# Patient Record
Sex: Female | Born: 1955 | Race: White | Hispanic: No | Marital: Married | Smoking: Never smoker
Health system: Southern US, Community
[De-identification: ages and names within clinical notes are randomized; demographics above are authoritative.]

## PROBLEM LIST (undated history)

## (undated) DIAGNOSIS — Z860100 Personal history of colon polyps, unspecified: Secondary | ICD-10-CM

## (undated) DIAGNOSIS — B019 Varicella without complication: Secondary | ICD-10-CM

## (undated) DIAGNOSIS — Z8709 Personal history of other diseases of the respiratory system: Secondary | ICD-10-CM

## (undated) DIAGNOSIS — F419 Anxiety disorder, unspecified: Secondary | ICD-10-CM

## (undated) DIAGNOSIS — Z923 Personal history of irradiation: Secondary | ICD-10-CM

## (undated) DIAGNOSIS — T4145XA Adverse effect of unspecified anesthetic, initial encounter: Secondary | ICD-10-CM

## (undated) DIAGNOSIS — C801 Malignant (primary) neoplasm, unspecified: Secondary | ICD-10-CM

## (undated) DIAGNOSIS — T8859XA Other complications of anesthesia, initial encounter: Secondary | ICD-10-CM

## (undated) DIAGNOSIS — F909 Attention-deficit hyperactivity disorder, unspecified type: Secondary | ICD-10-CM

## (undated) DIAGNOSIS — K219 Gastro-esophageal reflux disease without esophagitis: Secondary | ICD-10-CM

## (undated) DIAGNOSIS — M1990 Unspecified osteoarthritis, unspecified site: Secondary | ICD-10-CM

## (undated) DIAGNOSIS — K5730 Diverticulosis of large intestine without perforation or abscess without bleeding: Secondary | ICD-10-CM

## (undated) DIAGNOSIS — K648 Other hemorrhoids: Secondary | ICD-10-CM

## (undated) DIAGNOSIS — Z8719 Personal history of other diseases of the digestive system: Secondary | ICD-10-CM

## (undated) HISTORY — DX: Varicella without complication: B01.9

## (undated) HISTORY — PX: BUNIONECTOMY: SHX129

## (undated) HISTORY — DX: Unspecified osteoarthritis, unspecified site: M19.90

## (undated) HISTORY — DX: Anxiety disorder, unspecified: F41.9

## (undated) HISTORY — PX: ABDOMINAL HYSTERECTOMY: SHX81

## (undated) HISTORY — DX: Attention-deficit hyperactivity disorder, unspecified type: F90.9

---
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
## 1998-03-21 ENCOUNTER — Ambulatory Visit (HOSPITAL_COMMUNITY): Admission: RE | Admit: 1998-03-21 | Discharge: 1998-03-21 | Payer: Self-pay | Admitting: Obstetrics & Gynecology

## 1998-09-25 ENCOUNTER — Other Ambulatory Visit: Admission: RE | Admit: 1998-09-25 | Discharge: 1998-09-25 | Payer: Self-pay | Admitting: Obstetrics & Gynecology

## 1999-04-09 ENCOUNTER — Encounter: Payer: Self-pay | Admitting: Obstetrics & Gynecology

## 1999-04-09 ENCOUNTER — Ambulatory Visit (HOSPITAL_COMMUNITY): Admission: RE | Admit: 1999-04-09 | Discharge: 1999-04-09 | Payer: Self-pay | Admitting: Obstetrics & Gynecology

## 1999-11-12 ENCOUNTER — Other Ambulatory Visit: Admission: RE | Admit: 1999-11-12 | Discharge: 1999-11-12 | Payer: Self-pay | Admitting: Obstetrics & Gynecology

## 2001-01-08 ENCOUNTER — Other Ambulatory Visit: Admission: RE | Admit: 2001-01-08 | Discharge: 2001-01-08 | Payer: Self-pay | Admitting: Obstetrics & Gynecology

## 2002-03-26 ENCOUNTER — Other Ambulatory Visit: Admission: RE | Admit: 2002-03-26 | Discharge: 2002-03-26 | Payer: Self-pay | Admitting: Obstetrics & Gynecology

## 2003-03-25 ENCOUNTER — Other Ambulatory Visit: Admission: RE | Admit: 2003-03-25 | Discharge: 2003-03-25 | Payer: Self-pay | Admitting: Obstetrics & Gynecology

## 2004-04-20 ENCOUNTER — Other Ambulatory Visit: Admission: RE | Admit: 2004-04-20 | Discharge: 2004-04-20 | Payer: Self-pay | Admitting: Obstetrics & Gynecology

## 2005-05-27 ENCOUNTER — Other Ambulatory Visit: Admission: RE | Admit: 2005-05-27 | Discharge: 2005-05-27 | Payer: Self-pay | Admitting: Obstetrics & Gynecology

## 2005-06-04 ENCOUNTER — Encounter: Admission: RE | Admit: 2005-06-04 | Discharge: 2005-06-04 | Payer: Self-pay | Admitting: Emergency Medicine

## 2005-06-04 DIAGNOSIS — Z8719 Personal history of other diseases of the digestive system: Secondary | ICD-10-CM

## 2005-06-04 HISTORY — DX: Personal history of other diseases of the digestive system: Z87.19

## 2006-07-08 LAB — HM COLONOSCOPY

## 2006-10-28 ENCOUNTER — Ambulatory Visit (HOSPITAL_COMMUNITY): Admission: RE | Admit: 2006-10-28 | Discharge: 2006-10-28 | Payer: Self-pay | Admitting: Obstetrics & Gynecology

## 2007-06-25 HISTORY — PX: COLONOSCOPY: SHX174

## 2007-10-29 ENCOUNTER — Ambulatory Visit (HOSPITAL_COMMUNITY): Admission: RE | Admit: 2007-10-29 | Discharge: 2007-10-29 | Payer: Self-pay | Admitting: Obstetrics & Gynecology

## 2008-07-08 ENCOUNTER — Encounter: Admission: RE | Admit: 2008-07-08 | Discharge: 2008-07-08 | Payer: Self-pay | Admitting: Obstetrics & Gynecology

## 2008-12-28 ENCOUNTER — Encounter: Admission: RE | Admit: 2008-12-28 | Discharge: 2008-12-28 | Payer: Self-pay | Admitting: Obstetrics & Gynecology

## 2010-04-03 ENCOUNTER — Encounter: Admission: RE | Admit: 2010-04-03 | Discharge: 2010-04-03 | Payer: Self-pay | Admitting: Obstetrics & Gynecology

## 2010-05-14 ENCOUNTER — Emergency Department (HOSPITAL_BASED_OUTPATIENT_CLINIC_OR_DEPARTMENT_OTHER): Admission: EM | Admit: 2010-05-14 | Discharge: 2010-05-14 | Payer: Self-pay | Admitting: Emergency Medicine

## 2011-07-09 ENCOUNTER — Other Ambulatory Visit (HOSPITAL_COMMUNITY): Payer: Self-pay | Admitting: Obstetrics & Gynecology

## 2011-07-10 ENCOUNTER — Other Ambulatory Visit: Payer: Self-pay | Admitting: Obstetrics & Gynecology

## 2011-07-10 DIAGNOSIS — Z1231 Encounter for screening mammogram for malignant neoplasm of breast: Secondary | ICD-10-CM

## 2011-07-24 ENCOUNTER — Ambulatory Visit: Payer: Self-pay

## 2011-07-25 ENCOUNTER — Ambulatory Visit
Admission: RE | Admit: 2011-07-25 | Discharge: 2011-07-25 | Disposition: A | Discharge status: Home / Self Care | Payer: BC Managed Care – PPO | Source: Ambulatory Visit | Attending: Obstetrics & Gynecology | Admitting: Obstetrics & Gynecology

## 2011-07-25 DIAGNOSIS — Z1231 Encounter for screening mammogram for malignant neoplasm of breast: Secondary | ICD-10-CM

## 2011-10-01 ENCOUNTER — Ambulatory Visit (INDEPENDENT_AMBULATORY_CARE_PROVIDER_SITE_OTHER): Payer: No Typology Code available for payment source | Admitting: Family Medicine

## 2011-10-01 ENCOUNTER — Encounter: Payer: Self-pay | Admitting: Family Medicine

## 2011-10-01 VITALS — BP 108/75 | HR 75 | Temp 98.5°F | Ht 66.0 in | Wt 160.2 lb

## 2011-10-01 DIAGNOSIS — M21619 Bunion of unspecified foot: Secondary | ICD-10-CM

## 2011-10-01 DIAGNOSIS — S90129A Contusion of unspecified lesser toe(s) without damage to nail, initial encounter: Secondary | ICD-10-CM

## 2011-10-01 DIAGNOSIS — K219 Gastro-esophageal reflux disease without esophagitis: Secondary | ICD-10-CM

## 2011-10-01 NOTE — Progress Notes (Signed)
  Subjective:    Patient ID: Gloria Frazier, female    DOB: 17-Jan-1956, 56 y.o.   MRN: [id number]  HPI New to establish.  Previous MD- Lorenz Coaster.  GYNJennette Kettle, 3/13.  Rheum- Deveshwar.  Last CPE 07/18/11.  GERD- ongoing issue for pt, typically well controlled w/ diet and lifestyle.  Has Aciphex available for prn use.  Now having intermittent AM dry heaves.  Bunions- has bunionectomy upcoming.  This is causing her to alter gait and causing IT band pain.  R 5th toe pain- banged toe on chair yesterday.  Swollen and bruised.  Able to ambulate w/ minimal pain.   Review of Systems For ROS see HPI     Objective:   Physical Exam  Vitals reviewed. Constitutional: She is oriented to person, place, and time. She appears well-developed and well-nourished. No distress.  HENT:  Head: Normocephalic and atraumatic.  Eyes: Conjunctivae and EOM are normal. Pupils are equal, round, and reactive to light.  Neck: Normal range of motion. Neck supple. No thyromegaly present.  Cardiovascular: Normal rate, regular rhythm, normal heart sounds and intact distal pulses.   No murmur heard. Pulmonary/Chest: Effort normal and breath sounds normal. No respiratory distress.  Abdominal: Soft. She exhibits no distension. There is no tenderness.  Musculoskeletal: She exhibits no edema.       Right foot: She exhibits tenderness (over R 5th toe). She exhibits normal range of motion and no bony tenderness. Swelling: over R 5th toe.  Lymphadenopathy:    She has no cervical adenopathy.  Neurological: She is alert and oriented to person, place, and time.  Skin: Skin is warm and dry.  Psychiatric: She has a normal mood and affect. Her behavior is normal.          Assessment & Plan:

## 2011-10-01 NOTE — Patient Instructions (Signed)
Plan on your complete physical in late January You look great!  Keep up the good work! Ice and elevate you foot Welcome!  We're glad to have you!!

## 2011-10-13 DIAGNOSIS — S90129A Contusion of unspecified lesser toe(s) without damage to nail, initial encounter: Secondary | ICD-10-CM | POA: Insufficient documentation

## 2011-10-13 DIAGNOSIS — M21619 Bunion of unspecified foot: Secondary | ICD-10-CM | POA: Insufficient documentation

## 2011-10-13 DIAGNOSIS — K219 Gastro-esophageal reflux disease without esophagitis: Secondary | ICD-10-CM | POA: Insufficient documentation

## 2011-10-13 NOTE — Assessment & Plan Note (Signed)
New.  Controlling w/ diet and lifestyle.  Has PPI available prn.

## 2011-10-13 NOTE — Assessment & Plan Note (Signed)
New.  Pt has surgery upcoming.  Will follow.

## 2011-10-13 NOTE — Assessment & Plan Note (Signed)
New.  Impressive swelling and bruising of toe but able to bear weight and bend toe w/ little pain.  Ice, NSAIDs, prn.  Pt expressed understanding and is in agreement w/ plan.

## 2012-02-07 ENCOUNTER — Other Ambulatory Visit (HOSPITAL_BASED_OUTPATIENT_CLINIC_OR_DEPARTMENT_OTHER): Payer: Self-pay | Admitting: Rheumatology

## 2012-02-07 DIAGNOSIS — M25559 Pain in unspecified hip: Secondary | ICD-10-CM

## 2012-02-11 ENCOUNTER — Ambulatory Visit (HOSPITAL_BASED_OUTPATIENT_CLINIC_OR_DEPARTMENT_OTHER)
Admission: RE | Admit: 2012-02-11 | Discharge: 2012-02-11 | Disposition: A | Discharge status: Home / Self Care | Payer: No Typology Code available for payment source | Source: Ambulatory Visit | Attending: Rheumatology | Admitting: Rheumatology

## 2012-02-11 DIAGNOSIS — M25559 Pain in unspecified hip: Secondary | ICD-10-CM | POA: Insufficient documentation

## 2012-02-11 DIAGNOSIS — M25459 Effusion, unspecified hip: Secondary | ICD-10-CM | POA: Insufficient documentation

## 2012-03-09 ENCOUNTER — Ambulatory Visit (INDEPENDENT_AMBULATORY_CARE_PROVIDER_SITE_OTHER): Payer: No Typology Code available for payment source | Admitting: Family Medicine

## 2012-03-09 ENCOUNTER — Encounter: Payer: Self-pay | Admitting: Family Medicine

## 2012-03-09 VITALS — BP 121/84 | HR 77 | Temp 98.8°F | Ht 65.75 in | Wt 164.6 lb

## 2012-03-09 DIAGNOSIS — F902 Attention-deficit hyperactivity disorder, combined type: Secondary | ICD-10-CM

## 2012-03-09 DIAGNOSIS — F909 Attention-deficit hyperactivity disorder, unspecified type: Secondary | ICD-10-CM

## 2012-03-09 DIAGNOSIS — L259 Unspecified contact dermatitis, unspecified cause: Secondary | ICD-10-CM | POA: Insufficient documentation

## 2012-03-09 DIAGNOSIS — M1610 Unilateral primary osteoarthritis, unspecified hip: Secondary | ICD-10-CM | POA: Insufficient documentation

## 2012-03-09 MED ORDER — AMPHETAMINE-DEXTROAMPHET ER 10 MG PO CP24: 10.0000 mg | ORAL_CAPSULE | ORAL | Status: DC

## 2012-03-09 MED ORDER — HYDROCORTISONE 2.5 % EX OINT: TOPICAL_OINTMENT | Freq: Two times a day (BID) | CUTANEOUS | Status: DC

## 2012-03-09 NOTE — Patient Instructions (Addendum)
Follow up in 4-6 weeks to recheck attention Start the Adderall daily Restart the ibuprofen as needed for hip pain Use the hydrocortisone ointment twice daily for the rash- this looks like something you came in contact with Call with any questions or concerns Hang in there!

## 2012-03-09 NOTE — Progress Notes (Signed)
  Subjective:    Patient ID: Gloria Frazier, female    DOB: 03/25/1956, 56 y.o.   MRN: [id number]  HPI Foot surgery on 5/31 w/ Dr Lestine Box.  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  After 3 months off her feet had severe hip pain due to poor muscle tone supporting hip.  Had cortisone injxn in hip.  Has f/u scheduled in December.  Now using Tumeric in apple sauce as herbal supplement.  On 8/30 developed rash on inner thighs, arms, abd- stopped tumeric but rash didn't go away.  Pt remembered that she had rash following use of ibuprofen- stopped the ibuprofen- and rash remains.  Ibuprofen was helping joint pain and she wants to know if she can resume this.  Pt has recently changed laundry detergent.  ADHD- in 2011 pt saw psychologist, Gloria Frazier, for ADHD evaluation.  Pt was previously on meds but stopped taking these in Feb 2013.  Was on Adderall ER 15mg  2 in the AM, 1 in the afternoon.  Stopped the afternoon med, then 1 of the AM meds.  Eventually stopped meds all together.  From May to August, she really 'didn't do anything b/c of my foot'.  Now that she's up and about again, she is having difficulty w/ focus and concentration.  Inability to complete tasks.  Has not ever taken short acting med.  Was on concerta 'which did absolutely nothing'   Review of Systems For ROS see HPI     Objective:   Physical Exam  Vitals reviewed. Constitutional: She is oriented to person, place, and time. She appears well-developed and well-nourished. No distress.  Neurological: She is alert and oriented to person, place, and time.  Skin: Skin is warm and dry. Rash (faint maculopapular rash on upper arms, hips, waist line, buttocks, inner thighs) noted.  Psychiatric: She has a normal mood and affect. Her behavior is normal. Thought content normal.       Rapid, almost pressured speech          Assessment & Plan:

## 2012-03-09 NOTE — Assessment & Plan Note (Signed)
New to provider.  Pt had recent steroid injxn.  Following w/ rheum.  Will assist as able.

## 2012-03-09 NOTE — Assessment & Plan Note (Signed)
New to provider.  Pt brought copy of psych dx.  Discussed previous med regimen and need for meds going forward.  Discussed adderall vs stratera.  Pt prefers to Medical sales representative.  Will start lower dose of extended release Adderall as pt felt previous dose was 'too much'.  Pt was on 30mg  XR in the AM and 15mg  XR in the afternoon.  Will follow closely.

## 2012-03-09 NOTE — Assessment & Plan Note (Signed)
New.  Pt reports switching laundry detergent recently.  Rash did not improve when she stopped ibuprofen- reassured her that this is not drug rash.  Start steroid ointment.  Reviewed supportive care and red flags that should prompt return.  Pt expressed understanding and is in agreement w/ plan.

## 2012-04-13 ENCOUNTER — Encounter: Payer: Self-pay | Admitting: Family Medicine

## 2012-04-13 ENCOUNTER — Ambulatory Visit (INDEPENDENT_AMBULATORY_CARE_PROVIDER_SITE_OTHER): Payer: No Typology Code available for payment source | Admitting: Family Medicine

## 2012-04-13 VITALS — BP 102/62 | HR 67 | Temp 98.0°F | Ht 65.0 in | Wt 164.0 lb

## 2012-04-13 DIAGNOSIS — F902 Attention-deficit hyperactivity disorder, combined type: Secondary | ICD-10-CM

## 2012-04-13 DIAGNOSIS — F909 Attention-deficit hyperactivity disorder, unspecified type: Secondary | ICD-10-CM

## 2012-04-13 MED ORDER — AMPHETAMINE-DEXTROAMPHET ER 10 MG PO CP24: 10.0000 mg | ORAL_CAPSULE | ORAL | Status: DC

## 2012-04-13 NOTE — Assessment & Plan Note (Signed)
Improved since starting Adderall.  No adverse effects from med.  Continue current dose.  Will follow.

## 2012-04-13 NOTE — Patient Instructions (Addendum)
Schedule your complete physical for late January Continue the Adderall Keep up the good work!  You look great! Call with any questions or concerns Happy fall!!

## 2012-04-13 NOTE — Progress Notes (Signed)
  Subjective:    Patient ID: Gloria Frazier, female    DOB: 04/26/1956, 56 y.o.   MRN: [id number]  HPI ADHD- was restarted on Adderall at last visit.  Is on low dose, 10 mg daily.  Ran out of meds 1 week ago.  Denies palpitations, insomnia, anorexia.  Reports concentration has improved, more task oriented.   Review of Systems For ROS see HPI     Objective:   Physical Exam  Vitals reviewed. Constitutional: She is oriented to person, place, and time. She appears well-developed and well-nourished. No distress.  Cardiovascular: Normal rate, regular rhythm, normal heart sounds and intact distal pulses.   Pulmonary/Chest: Effort normal and breath sounds normal. No respiratory distress. She has no wheezes. She has no rales.  Neurological: She is alert and oriented to person, place, and time. No cranial nerve deficit. Coordination normal.  Skin: Skin is warm and dry.  Psychiatric: She has a normal mood and affect. Her behavior is normal. Judgment and thought content normal.          Assessment & Plan:

## 2012-06-26 ENCOUNTER — Other Ambulatory Visit: Payer: Self-pay | Admitting: Obstetrics & Gynecology

## 2012-06-26 DIAGNOSIS — Z1231 Encounter for screening mammogram for malignant neoplasm of breast: Secondary | ICD-10-CM

## 2012-07-27 ENCOUNTER — Ambulatory Visit
Admission: RE | Admit: 2012-07-27 | Discharge: 2012-07-27 | Disposition: A | Discharge status: Home / Self Care | Payer: No Typology Code available for payment source | Source: Ambulatory Visit | Attending: Obstetrics & Gynecology | Admitting: Obstetrics & Gynecology

## 2012-07-27 DIAGNOSIS — Z1231 Encounter for screening mammogram for malignant neoplasm of breast: Secondary | ICD-10-CM

## 2012-08-25 ENCOUNTER — Other Ambulatory Visit: Payer: Self-pay | Admitting: *Deleted

## 2012-08-25 MED ORDER — AMPHETAMINE-DEXTROAMPHET ER 10 MG PO CP24: 10.0000 mg | ORAL_CAPSULE | ORAL | Status: DC

## 2012-08-25 NOTE — Telephone Encounter (Signed)
Left Pt detail message Rx will be ready after 9 am on tomorrow and that she will need to come in herself to sign control agreement. Pt to return call with any additional concerns.

## 2012-08-26 ENCOUNTER — Encounter: Payer: Self-pay | Admitting: Lab

## 2012-08-26 ENCOUNTER — Telehealth: Payer: Self-pay | Admitting: *Deleted

## 2012-08-26 NOTE — Telephone Encounter (Addendum)
Patient came to office to pick up prescription for Adderall but refused to sign agreement and submit to UDS. Patient felt as if "it is a violation of my  privacy rights and is unlawful search and seizure. Patient stated "we are profiling and totally disagrees with this policy". I explained that we are piloting this program and it will soon be going out division wide. Patient still refused to sign and demanded copies of agreement, which was provided. I further advised that if she wanted to speak with the office manager she could, she responded "I don't need to speak with any body else. This is violating my rights and is bigger than you, me and all of [place]." Patient then left. Script voided and placed in shred bin.

## 2012-08-26 NOTE — Telephone Encounter (Signed)
Sorry pt was upset by the policy but no one is entitled to controlled substances and the DEA supports programs such as this one.  If she is not willing to comply w/ the policy, we will no longer provide her meds.

## 2012-09-03 ENCOUNTER — Telehealth: Payer: Self-pay | Admitting: *Deleted

## 2012-09-03 NOTE — Telephone Encounter (Signed)
Will be happy to speak w/ pt about this but it seems that if pt were taking her meds appropriately that this should not be an issue.  No one is entitled to medications and these meds are tightly monitored/regulated by the government

## 2012-09-03 NOTE — Telephone Encounter (Signed)
Patient called triage wanting more information regarding the controlled substance agreement, assured toxicology and the UDS screen. Supplied her with name and number of assured billing and [facility].  She stated that she still fells "like my constitutional rights are being violated and this goes against my privacy rights. I am going to be in contact with the [employer]CLU and anyone else who can assist me." Patient again asked if this was just our office or all of [place]. I advised that our lead physician spoke with the executive committee about taking this division wide and they were agreeable to that. I further explained that all of the  sites would be implementing this over the next few months. She asked who decided to start this with our site and I replied that all of our physicians met with the [employer] Rep and they all decided that this would be a great policy to begin implementing. She again expressed her disagreement with this policy and felt that she may have to make an appt to speak with her MD about this. I offered to help find an appt time while she was on the phone but she declined at this time.

## 2013-04-07 ENCOUNTER — Ambulatory Visit (INDEPENDENT_AMBULATORY_CARE_PROVIDER_SITE_OTHER): Payer: No Typology Code available for payment source

## 2013-04-07 DIAGNOSIS — Z23 Encounter for immunization: Secondary | ICD-10-CM

## 2013-06-09 ENCOUNTER — Telehealth: Payer: Self-pay

## 2013-06-09 NOTE — Telephone Encounter (Signed)
Left message for call back  identifiable  Flu vaccine--03/2013 [facility]--07/2012--neg CCS--? Pap?

## 2013-06-10 ENCOUNTER — Encounter: Payer: Self-pay | Admitting: General Practice

## 2013-06-10 ENCOUNTER — Encounter: Payer: Self-pay | Admitting: Family Medicine

## 2013-06-10 ENCOUNTER — Ambulatory Visit (INDEPENDENT_AMBULATORY_CARE_PROVIDER_SITE_OTHER): Payer: No Typology Code available for payment source | Admitting: Family Medicine

## 2013-06-10 VITALS — BP 120/78 | HR 64 | Temp 98.2°F | Resp 16 | Ht 65.25 in | Wt 170.1 lb

## 2013-06-10 DIAGNOSIS — F902 Attention-deficit hyperactivity disorder, combined type: Secondary | ICD-10-CM

## 2013-06-10 DIAGNOSIS — F909 Attention-deficit hyperactivity disorder, unspecified type: Secondary | ICD-10-CM

## 2013-06-10 DIAGNOSIS — Z1211 Encounter for screening for malignant neoplasm of colon: Secondary | ICD-10-CM

## 2013-06-10 DIAGNOSIS — Z0000 Encounter for general adult medical examination without abnormal findings: Secondary | ICD-10-CM

## 2013-06-10 LAB — BASIC METABOLIC PANEL
BUN: 17 mg/dL (ref 6–23)
Chloride: 107 mEq/L (ref 96–112)
Potassium: 3.8 mEq/L (ref 3.5–5.1)
Sodium: 140 mEq/L (ref 135–145)

## 2013-06-10 LAB — HEPATIC FUNCTION PANEL
ALT: 23 U/L (ref 0–35)
AST: 26 U/L (ref 0–37)
Alkaline Phosphatase: 61 U/L (ref 39–117)
Total Bilirubin: 0.7 mg/dL (ref 0.3–1.2)

## 2013-06-10 LAB — CBC WITH DIFFERENTIAL/PLATELET
Basophils Relative: 0.4 % (ref 0.0–3.0)
Eosinophils Absolute: 0.2 10*3/uL (ref 0.0–0.7)
Eosinophils Relative: 1.8 % (ref 0.0–5.0)
Lymphocytes Relative: 19.7 % (ref 12.0–46.0)
MCHC: 34.3 g/dL (ref 30.0–36.0)
Neutrophils Relative %: 72.5 % (ref 43.0–77.0)
Platelets: 283 10*3/uL (ref 150.0–400.0)
RBC: 4.2 Mil/uL (ref 3.87–5.11)
WBC: 9.1 10*3/uL (ref 4.5–10.5)

## 2013-06-10 LAB — TSH: TSH: 0.89 u[IU]/mL (ref 0.35–5.50)

## 2013-06-10 LAB — LDL CHOLESTEROL, DIRECT: Direct LDL: 130.2 mg/dL

## 2013-06-10 LAB — LIPID PANEL
HDL: 70.4 mg/dL (ref >39.00)
Total CHOL/HDL Ratio: 3
VLDL: 17.6 mg/dL (ref 0.0–40.0)

## 2013-06-10 MED ORDER — METHYLPHENIDATE HCL ER (OSM) 18 MG PO TBCR: 18.0000 mg | EXTENDED_RELEASE_TABLET | Freq: Every day | ORAL | Status: DC

## 2013-06-10 NOTE — Patient Instructions (Signed)
Follow up in 1 year or as needed Keep up the good work!  You look great! We'll notify you of your lab results and make any changes if needed Call with any questions or concerns Happy Holidays!! 

## 2013-06-10 NOTE — Telephone Encounter (Signed)
Unable to reach pre visit.  

## 2013-06-10 NOTE — Assessment & Plan Note (Signed)
Start Concerta in place of adderall that caused insomnia.  Will follow.

## 2013-06-10 NOTE — Progress Notes (Signed)
   Subjective:    Patient ID: [name], female    DOB: 1955/12/28, 57 y.o.   MRN: [id number]  HPI CPE- UTD on pap, mammo.  Due for colonoscopy but Dr Kinnie Scales is no longer in network- needs referral, husband sees Dr Marina Goodell.  Scoliosis- pt reports mom had late onset scoliosis and it is severe.  No longer mobile.  Pt wants to follow regularly.  ADD- pt does not want to continue Adderall due to insomnia.  Wants to switch to Ritalin.  Review of Systems Patient reports no vision/ hearing changes, adenopathy,fever, weight change,  persistant/recurrent hoarseness , swallowing issues, chest pain, palpitations, edema, persistant/recurrent cough, hemoptysis, dyspnea (rest/exertional/paroxysmal nocturnal), gastrointestinal bleeding (melena, rectal bleeding), abdominal pain, significant heartburn, bowel changes, GU symptoms (dysuria, hematuria, incontinence), Gyn symptoms (abnormal  bleeding, pain),  syncope, focal weakness, memory loss, numbness & tingling, skin/hair/nail changes, abnormal bruising or bleeding, anxiety, or depression.     Objective:   Physical Exam General Appearance:    Alert, cooperative, no distress, appears stated age  Head:    Normocephalic, without obvious abnormality, atraumatic  Eyes:    PERRL, conjunctiva/corneas clear, EOM's intact, fundi    benign, both eyes  Ears:    Normal TM's and external ear canals, both ears  Nose:   Nares normal, septum midline, mucosa normal, no drainage    or sinus tenderness  Throat:   Lips, mucosa, and tongue normal; teeth and gums normal  Neck:   Supple, symmetrical, trachea midline, no adenopathy;    Thyroid: no enlargement/tenderness/nodules  Back:     Symmetric, no curvature, ROM normal, no CVA tenderness  Lungs:     Clear to auscultation bilaterally, respirations unlabored  Chest Wall:    No tenderness or deformity   Heart:    Regular rate and rhythm, S1 and S2 normal, no murmur, rub   or gallop  Breast Exam:    Deferred to GYN    Abdomen:     Soft, non-tender, bowel sounds active all four quadrants,    no masses, no organomegaly  Genitalia:    Deferred to GYN  Rectal:    Extremities:   Extremities normal, atraumatic, no cyanosis or edema  Pulses:   2+ and symmetric all extremities  Skin:   Skin color, texture, turgor normal, no rashes or lesions  Lymph nodes:   Cervical, supraclavicular, and axillary nodes normal  Neurologic:   CNII-XII intact, normal strength, sensation and reflexes    throughout          Assessment & Plan:

## 2013-06-10 NOTE — Assessment & Plan Note (Signed)
Pt's PE WNL.  Due for colonoscopy- referral entered.  UTD on GYN.  Check labs.  Anticipatory guidance provided.

## 2013-06-10 NOTE — Progress Notes (Signed)
Pre visit review using our clinic review tool, if applicable. No additional management support is needed unless otherwise documented below in the visit note. 

## 2013-06-11 ENCOUNTER — Encounter: Payer: Self-pay | Admitting: General Practice

## 2013-06-14 ENCOUNTER — Encounter: Payer: Self-pay | Admitting: General Practice

## 2013-06-14 LAB — VITAMIN D 1,25 DIHYDROXY
Vitamin D 1, 25 (OH)2 Total: 52 pg/mL (ref 18–72)
Vitamin D2 1, 25 (OH)2: <8 pg/mL
Vitamin D3 1, 25 (OH)2: 52 pg/mL

## 2013-07-15 ENCOUNTER — Encounter: Payer: Self-pay | Admitting: Family Medicine

## 2013-07-15 ENCOUNTER — Ambulatory Visit (INDEPENDENT_AMBULATORY_CARE_PROVIDER_SITE_OTHER): Payer: No Typology Code available for payment source | Admitting: Family Medicine

## 2013-07-15 VITALS — BP 120/68 | HR 62 | Temp 98.2°F | Resp 16 | Wt 170.0 lb

## 2013-07-15 DIAGNOSIS — F909 Attention-deficit hyperactivity disorder, unspecified type: Secondary | ICD-10-CM

## 2013-07-15 DIAGNOSIS — F902 Attention-deficit hyperactivity disorder, combined type: Secondary | ICD-10-CM

## 2013-07-15 MED ORDER — METHYLPHENIDATE HCL ER (OSM) 18 MG PO TBCR: 18.0000 mg | EXTENDED_RELEASE_TABLET | Freq: Every day | ORAL | Status: DC

## 2013-07-15 MED ORDER — METHYLPHENIDATE HCL 10 MG PO TABS: ORAL_TABLET | ORAL | Status: DC

## 2013-07-15 NOTE — Patient Instructions (Signed)
Follow up in June to recheck ADD (sooner if needed) Add the Ritalin at lunch as needed for additional focus Continue the Concerta daily Call with any questions or concerns Happy [name]!!!

## 2013-07-15 NOTE — Progress Notes (Signed)
Patient received education resource, including the self-management goal and tool. Patient verbalized understanding. 

## 2013-07-15 NOTE — Assessment & Plan Note (Signed)
Pt's sxs have improved on Concerta but the effectiveness is wearing off in the afternoon.  Add short acting Ritalin to improve afternoon/evening sxs prn.  Pt expressed understanding and is in agreement w/ plan.

## 2013-07-15 NOTE — Progress Notes (Signed)
   Subjective:    Patient ID: Gloria Frazier, female    DOB: 09/22/1955, 58 y.o.   MRN: [id number]  HPI ADHD- chronic problem, switched to Concerta 1 month ago.  'in the morning i feel great.  It sorta peters out on me in the afternoon'.  Sleeping well.  No palpitations, elevated BP.   Review of Systems For ROS see HPI     Objective:   Physical Exam  Vitals reviewed. Constitutional: She is oriented to person, place, and time. She appears well-developed and well-nourished. No distress.  HENT:  Head: Normocephalic and atraumatic.  Eyes: Conjunctivae and EOM are normal. Pupils are equal, round, and reactive to light.  Neck: Normal range of motion. Neck supple. No thyromegaly present.  Cardiovascular: Normal rate, regular rhythm, normal heart sounds and intact distal pulses.   No murmur heard. Pulmonary/Chest: Effort normal and breath sounds normal. No respiratory distress.  Abdominal: Soft. She exhibits no distension. There is no tenderness.  Musculoskeletal: She exhibits no edema.  Lymphadenopathy:    She has no cervical adenopathy.  Neurological: She is alert and oriented to person, place, and time.  Skin: Skin is warm and dry.  Psychiatric: She has a normal mood and affect. Her behavior is normal.          Assessment & Plan:

## 2013-07-22 ENCOUNTER — Encounter: Payer: Self-pay | Admitting: Internal Medicine

## 2013-08-24 ENCOUNTER — Ambulatory Visit (AMBULATORY_SURGERY_CENTER): Payer: Self-pay | Admitting: *Deleted

## 2013-08-24 ENCOUNTER — Telehealth: Payer: Self-pay | Admitting: *Deleted

## 2013-08-24 VITALS — Ht 65.25 in | Wt 168.0 lb

## 2013-08-24 DIAGNOSIS — Z1211 Encounter for screening for malignant neoplasm of colon: Secondary | ICD-10-CM

## 2013-08-24 MED ORDER — MOVIPREP 100 G PO SOLR: 1.0000 | Freq: Once | ORAL | Status: DC

## 2013-08-24 NOTE — Progress Notes (Signed)
Denies complications with sedation or anesthesia. Denies allergies to eggs or soy products.

## 2013-08-24 NOTE — Telephone Encounter (Signed)
Prior GI hx with Dr. Earlean Shawl. Request for release of medical records signed by patient and given to [facility] for follow up. Colonoscopy scheduled 09/07/13 1030 am with Dr. Henrene Pastor

## 2013-08-24 NOTE — Progress Notes (Signed)
Patient with previous GI history with Dr. Earlean Shawl, approximately 5 years ago. Changing providers due to insurance coverage. She states that she does not believe any polyps were found only diverticulosis but that Dr. Earlean Shawl routinely screens his patient every 5 years. Explained that insurance may not cover procedure if no family history, no current GI problems and no history of polyps, recommendations are generally to repeat colonoscopy every 10 years. Patient aware. Informed we would keep colonoscopy scheduled 09/07/13 with Dr. Henrene Pastor and request records from Dr. Earlean Shawl. Request for release of records signed by patient and given to [facility] for further followup.

## 2013-08-27 ENCOUNTER — Other Ambulatory Visit: Payer: Self-pay

## 2013-08-27 ENCOUNTER — Telehealth: Payer: Self-pay | Admitting: Family Medicine

## 2013-08-27 ENCOUNTER — Encounter: Payer: Self-pay | Admitting: Internal Medicine

## 2013-08-27 DIAGNOSIS — Z1231 Encounter for screening mammogram for malignant neoplasm of breast: Secondary | ICD-10-CM

## 2013-08-27 MED ORDER — METHYLPHENIDATE HCL 10 MG PO TABS: ORAL_TABLET | ORAL | Status: DC

## 2013-08-27 NOTE — Telephone Encounter (Signed)
Med filled and pt notified.  

## 2013-08-27 NOTE — Telephone Encounter (Signed)
Patient called and requested a refill for methylphenidate (RITALIN) 10 MG tablet

## 2013-09-06 NOTE — Telephone Encounter (Signed)
Spoke with Gloria Eddy, RN regarding pt.  Patient requested that as a supervisor I call her.  I spoke with Dr Henrene Pastor prior to calling patient back and explained to him that patient wanted to have procedure despite our explanation of guidelines.   He states that he will agree to perform colonoscopy if patient is aware of the guidelines and that the procedure would not be necessary at this time.  I returned pts call and explained this to her.  She is still very upset that her records were not reviewed earlier and that we were having this conversation the day before her scheduled procedure.  She states that she has lost all confidence in our group and doesn't feel that she should go ahead with procedure at this time.  She explained to me that before I called her back, she had contacted [facility] office and found out that her records were sent 08/27/13.  She wants to know what took so long to review these records.  I apologized to Gloria Frazier and told her that I could not answer as to why this had happened.  I tried to assure her that this would be addressed with staff and hopefully would rectify this in the future.  I also told her that we could reimburse her for her prep which was a total of $80.  She wishes to speak to my director.  I gave her Gloria Frazier's name and explained to her that she would call her in the next few days to discuss this matter.

## 2013-09-06 NOTE — Telephone Encounter (Signed)
Message copied by Delos Haring on Mon Sep 06, 2013  1:38 PM ------      Message from: Irene Shipper      Created: Mon Sep 06, 2013 11:40 AM      Regarding: The need for colonoscopy       Patient's records brought to my attention today. Schedule for colonoscopy tomorrow. Had colonoscopy with Dr. Richmond Campbell 07/30/2007 for routine screening and hematochezia. The examination revealed moderate diverticulosis and internal hemorrhoids with a complete exam and excellent preparation. Based on current guidelines, the appropriate time for the patient's followup would be around February 2019. Please notify the patient and change her recall to February 2019. Of course, if she has developed a family history of colon cancer in a first-degree relative in the interim or is having relevant clinical problems such as bleeding of uncertain etiology or iron deficiency anemia, then proceed with the examination as planned. Please convert this to a phone note for the medical record. Thank you                  ----- Message -----         From: Audrea Muscat, CMA         Sent: 09/06/2013  10:56 AM           To: Irene Shipper, MD            Patient scheduled for colon tomorrow, not sure if records were received.  Please check on your desk in case records were put there. I will try to follow up on my end       ------

## 2013-09-06 NOTE — Telephone Encounter (Signed)
Phone call to patient to to cancel procedure for tomorrow. Records received and reviewed by Dr. Henrene Pastor today and he feels current guidelines for recall at 10 years appropriate. Patient denies any family history nor current medical problems. Patient very upset and angry, has received approval from insurance company for complete coverage, has already purchased prep and mixed it, and has been following dietary prep instructions. She is upset that phone call is just being made today to cancel. Explained while doing chart prep today for tomorrows procedures, that I followed through with the records request and evaluation for need of procedure. Records reviewed by Dr. Henrene Pastor today and decision to cancel procedure. Offered patient refund for prep. Apologized profusely. Patient wants to speak to someone higher up. Offered her to speak with my direct supervisor, N. Sechrist. She wants phone call returned to her home phone number asap.

## 2013-09-07 ENCOUNTER — Encounter: Payer: No Typology Code available for payment source | Admitting: Internal Medicine

## 2013-09-14 ENCOUNTER — Ambulatory Visit: Payer: No Typology Code available for payment source

## 2013-09-21 ENCOUNTER — Telehealth: Payer: Self-pay | Admitting: *Deleted

## 2013-09-21 MED ORDER — METHYLPHENIDATE HCL ER (OSM) 18 MG PO TBCR: 18.0000 mg | EXTENDED_RELEASE_TABLET | Freq: Every day | ORAL | Status: DC

## 2013-09-21 NOTE — Telephone Encounter (Signed)
Pt came by office, requested refill on methylphenidate (CONCERTA) 18 MG CR tablet.  Last refilled 07/15/2013, #30, 0 refills Last OV 07/15/2013  Please advise pt when ready to be picked up.  bw

## 2013-09-21 NOTE — Telephone Encounter (Signed)
Med filled and pt notified.  

## 2013-10-18 ENCOUNTER — Telehealth: Payer: Self-pay | Admitting: Family Medicine

## 2013-10-18 MED ORDER — METHYLPHENIDATE HCL ER (OSM) 18 MG PO TBCR: 18.0000 mg | EXTENDED_RELEASE_TABLET | Freq: Every day | ORAL | Status: DC

## 2013-10-18 NOTE — Telephone Encounter (Signed)
Caller name:Margarit Relation to pt: Call back number:[phone] Pharmacy:  Reason for call: pt needs refill on RX methylphenidate (CONCERTA) 18 MG CR tablet

## 2013-10-18 NOTE — Telephone Encounter (Signed)
Med filled and pt notified.  

## 2013-10-26 ENCOUNTER — Telehealth: Payer: Self-pay | Admitting: Family Medicine

## 2013-10-26 MED ORDER — METHYLPHENIDATE HCL 10 MG PO TABS: ORAL_TABLET | ORAL | Status: DC

## 2013-10-26 NOTE — Telephone Encounter (Signed)
Caller name: Iza Relation to pt: Call back [phone]  Reason for call:  Pt is needing a refill on RX methylphenidate (RITALIN) 10 MG tablet [[id number]] DISCONTINUED

## 2013-10-26 NOTE — Telephone Encounter (Signed)
Med filled, pt notified, tabori is gone until tomorrow.

## 2013-10-27 ENCOUNTER — Telehealth: Payer: Self-pay | Admitting: Family Medicine

## 2013-10-27 MED ORDER — METHYLPHENIDATE HCL ER (OSM) 18 MG PO TBCR: 18.0000 mg | EXTENDED_RELEASE_TABLET | Freq: Every day | ORAL | Status: DC

## 2013-10-27 NOTE — Telephone Encounter (Signed)
Med filled and pt notified.  

## 2013-10-27 NOTE — Telephone Encounter (Signed)
Caller name: Karess Relation to pt: self Call back number:  :[phone]  Reason for call:   Pt called 5/5/ for refill on rx Ritalin.  Pt states that they have 2 separate RX for Ritalin.  Please contact pt to verify this information.

## 2013-12-09 ENCOUNTER — Telehealth: Payer: Self-pay | Admitting: *Deleted

## 2013-12-09 MED ORDER — METHYLPHENIDATE HCL 10 MG PO TABS: ORAL_TABLET | ORAL | Status: DC

## 2013-12-09 NOTE — Telephone Encounter (Signed)
Caller name: Harmonie Relation to pt: self Call back [phone]  Pharmacy: [facility]  Reason for call:  Pt request refill on  methylphenidate (RITALIN) 10 MG tablet  Last filled 10/26/2013, #30, no refills Last OV 07/15/2013  Has enough for today.  Pt has appt scheduled Monday, 12/13/2013 but will be out tomorrow.  Pt states this medication is working great for her.  Please advise.  bw

## 2013-12-09 NOTE — Telephone Encounter (Signed)
Ok to fill 

## 2013-12-09 NOTE — Telephone Encounter (Signed)
Med filled and pt notified.  

## 2013-12-09 NOTE — Telephone Encounter (Signed)
Ok to refill 

## 2013-12-13 ENCOUNTER — Encounter: Payer: Self-pay | Admitting: Family Medicine

## 2013-12-13 ENCOUNTER — Encounter: Payer: Self-pay | Admitting: General Practice

## 2013-12-13 ENCOUNTER — Ambulatory Visit (INDEPENDENT_AMBULATORY_CARE_PROVIDER_SITE_OTHER): Payer: No Typology Code available for payment source | Admitting: Family Medicine

## 2013-12-13 VITALS — BP 120/68 | HR 75 | Temp 98.4°F | Resp 16 | Wt 165.4 lb

## 2013-12-13 DIAGNOSIS — F902 Attention-deficit hyperactivity disorder, combined type: Secondary | ICD-10-CM

## 2013-12-13 DIAGNOSIS — R1013 Epigastric pain: Secondary | ICD-10-CM

## 2013-12-13 DIAGNOSIS — F909 Attention-deficit hyperactivity disorder, unspecified type: Secondary | ICD-10-CM

## 2013-12-13 DIAGNOSIS — K219 Gastro-esophageal reflux disease without esophagitis: Secondary | ICD-10-CM

## 2013-12-13 LAB — CBC WITH DIFFERENTIAL/PLATELET
BASOS ABS: 0 10*3/uL (ref 0.0–0.1)
Basophils Relative: 0.5 % (ref 0.0–3.0)
EOS ABS: 0.1 10*3/uL (ref 0.0–0.7)
Eosinophils Relative: 2 % (ref 0.0–5.0)
HCT: 40.4 % (ref 36.0–46.0)
Hemoglobin: 13.7 g/dL (ref 12.0–15.0)
LYMPHS PCT: 23.6 % (ref 12.0–46.0)
Lymphs Abs: 1.6 10*3/uL (ref 0.7–4.0)
MCHC: 34 g/dL (ref 30.0–36.0)
MCV: 90.8 fl (ref 78.0–100.0)
Monocytes Absolute: 0.5 10*3/uL (ref 0.1–1.0)
Monocytes Relative: 7.3 % (ref 3.0–12.0)
Neutro Abs: 4.5 10*3/uL (ref 1.4–7.7)
Neutrophils Relative %: 66.6 % (ref 43.0–77.0)
Platelets: 317 10*3/uL (ref 150.0–400.0)
RBC: 4.45 Mil/uL (ref 3.87–5.11)
RDW: 11.9 % (ref 11.5–15.5)
WBC: 6.8 10*3/uL (ref 4.0–10.5)

## 2013-12-13 LAB — HEPATIC FUNCTION PANEL
ALT: 29 U/L (ref 0–35)
AST: 34 U/L (ref 0–37)
Albumin: 4.1 g/dL (ref 3.5–5.2)
Alkaline Phosphatase: 59 U/L (ref 39–117)
BILIRUBIN DIRECT: 0 mg/dL (ref 0.0–0.3)
TOTAL PROTEIN: 6.9 g/dL (ref 6.0–8.3)
Total Bilirubin: 0.5 mg/dL (ref 0.2–1.2)

## 2013-12-13 LAB — BASIC METABOLIC PANEL
BUN: 16 mg/dL (ref 6–23)
CHLORIDE: 104 meq/L (ref 96–112)
CO2: 27 meq/L (ref 19–32)
Calcium: 9.2 mg/dL (ref 8.4–10.5)
Creatinine, Ser: 0.9 mg/dL (ref 0.4–1.2)
GFR: 72.66 mL/min (ref >60.00)
GLUCOSE: 82 mg/dL (ref 70–99)
POTASSIUM: 3.9 meq/L (ref 3.5–5.1)
SODIUM: 138 meq/L (ref 135–145)

## 2013-12-13 LAB — H. PYLORI ANTIBODY, IGG: H Pylori IgG: NEGATIVE

## 2013-12-13 MED ORDER — PANTOPRAZOLE SODIUM 40 MG PO TBEC: 40.0000 mg | DELAYED_RELEASE_TABLET | Freq: Every day | ORAL | Status: DC

## 2013-12-13 NOTE — Assessment & Plan Note (Signed)
sxs are well controlled.  No side effects.  No changes.

## 2013-12-13 NOTE — Progress Notes (Signed)
Pre visit review using our clinic review tool, if applicable. No additional management support is needed unless otherwise documented below in the visit note. 

## 2013-12-13 NOTE — Progress Notes (Signed)
   Subjective:    Patient ID: Gloria Frazier, female    DOB: Oct 06, 1955, 58 y.o.   MRN: [id number]  HPI ADHD- chronic problem, on Concerta daily in the AM w/ prn Ritalin at lunch for additional focus.  Pt is very pleased w/ results.  'we hit the nail right on the head'.  No palpitations, insomnia, weight loss.  'i'm great'.  GERD- chronic problem, previously had barium swallow and dx'd w/ reflux.  Started on Lansoprazole and sxs disappeared.  Does have hx of AM nausea w/ some gagging.  Now has had 5 episodes in 2-3 months of food getting stuck.  Had 1 severe episode of nocturnal reflux- took OTC Omeprazole x14 days w/ some improvement but 'sour stomach and nausea' continues.  Took some simethicone w/ some relief.   Review of Systems For ROS see HPI     Objective:   Physical Exam  Vitals reviewed. Constitutional: She is oriented to person, place, and time. She appears well-developed and well-nourished. No distress.  HENT:  Head: Normocephalic and atraumatic.  Eyes: Conjunctivae and EOM are normal. Pupils are equal, round, and reactive to light.  Neck: Normal range of motion. Neck supple. No thyromegaly present.  Cardiovascular: Normal rate, regular rhythm, normal heart sounds and intact distal pulses.   No murmur heard. Pulmonary/Chest: Effort normal and breath sounds normal. No respiratory distress.  Abdominal: Soft. She exhibits no distension. There is no tenderness.  Musculoskeletal: She exhibits no edema.  Lymphadenopathy:    She has no cervical adenopathy.  Neurological: She is alert and oriented to person, place, and time.  Skin: Skin is warm and dry.  Psychiatric: She has a normal mood and affect. Her behavior is normal.          Assessment & Plan:

## 2013-12-13 NOTE — Patient Instructions (Signed)
Schedule your complete physical in 6 months Start the Protonix daily for the reflux- if no improvement in the next month, please call me so we can refer you to GI We'll notify you of your lab results and make any changes if needed Call with any questions or concerns Have a great summer!!!

## 2013-12-13 NOTE — Assessment & Plan Note (Signed)
Deteriorated.  Restart PPI.  Check labs to r/o H pylori or biliary issues.  If no improvement after 1 month, will refer to GI.  Reviewed supportive care and red flags that should prompt return.  Pt expressed understanding and is in agreement w/ plan.

## 2013-12-13 NOTE — Assessment & Plan Note (Signed)
New.  Suspect this is due to untreated GERD and excessive acid production.  Start PPI.  Check labs.  Adjust plan prn.

## 2014-02-09 ENCOUNTER — Telehealth: Payer: Self-pay | Admitting: Family Medicine

## 2014-02-09 MED ORDER — METHYLPHENIDATE HCL 10 MG PO TABS: ORAL_TABLET | ORAL | Status: DC

## 2014-02-09 MED ORDER — METHYLPHENIDATE HCL ER (OSM) 18 MG PO TBCR: 18.0000 mg | EXTENDED_RELEASE_TABLET | Freq: Every day | ORAL | Status: DC

## 2014-02-09 NOTE — Telephone Encounter (Signed)
meds filled and pt notified.

## 2014-02-09 NOTE — Telephone Encounter (Signed)
Caller name: Carrissa Relation to pt: self  Call back number: [phone] [facility] [phone]   Reason for call: pt requesting a refill methylphenidate (RITALIN) 10 MG tablet and Concerta. Pt would like to be contacted on home or cell phone notifying her when rx is sent. Thank you+

## 2014-04-26 ENCOUNTER — Ambulatory Visit (INDEPENDENT_AMBULATORY_CARE_PROVIDER_SITE_OTHER): Payer: No Typology Code available for payment source

## 2014-04-26 DIAGNOSIS — Z23 Encounter for immunization: Secondary | ICD-10-CM

## 2014-05-16 ENCOUNTER — Other Ambulatory Visit: Payer: Self-pay | Admitting: General Practice

## 2014-05-16 MED ORDER — PANTOPRAZOLE SODIUM 40 MG PO TBEC: 40.0000 mg | DELAYED_RELEASE_TABLET | Freq: Every day | ORAL | Status: DC

## 2014-06-15 ENCOUNTER — Encounter: Payer: Self-pay | Admitting: General Practice

## 2014-06-15 ENCOUNTER — Ambulatory Visit (INDEPENDENT_AMBULATORY_CARE_PROVIDER_SITE_OTHER): Payer: No Typology Code available for payment source | Admitting: Family Medicine

## 2014-06-15 ENCOUNTER — Encounter: Payer: Self-pay | Admitting: Family Medicine

## 2014-06-15 VITALS — BP 112/74 | HR 84 | Temp 98.4°F | Resp 16 | Ht 65.0 in | Wt 148.8 lb

## 2014-06-15 DIAGNOSIS — Z0000 Encounter for general adult medical examination without abnormal findings: Secondary | ICD-10-CM

## 2014-06-15 LAB — VITAMIN D 25 HYDROXY (VIT D DEFICIENCY, FRACTURES): VITD: 41.84 ng/mL (ref 30.00–100.00)

## 2014-06-15 LAB — HEPATIC FUNCTION PANEL
ALBUMIN: 3.9 g/dL (ref 3.5–5.2)
ALK PHOS: 49 U/L (ref 39–117)
ALT: 29 U/L (ref 0–35)
AST: 28 U/L (ref 0–37)
Bilirubin, Direct: 0 mg/dL (ref 0.0–0.3)
Total Bilirubin: 0.6 mg/dL (ref 0.2–1.2)
Total Protein: 6.6 g/dL (ref 6.0–8.3)

## 2014-06-15 LAB — CBC WITH DIFFERENTIAL/PLATELET
BASOS ABS: 0 10*3/uL (ref 0.0–0.1)
Basophils Relative: 0.6 % (ref 0.0–3.0)
Eosinophils Absolute: 0.1 10*3/uL (ref 0.0–0.7)
Eosinophils Relative: 1.7 % (ref 0.0–5.0)
HCT: 40.9 % (ref 36.0–46.0)
Hemoglobin: 13.4 g/dL (ref 12.0–15.0)
LYMPHS PCT: 25 % (ref 12.0–46.0)
Lymphs Abs: 1.4 10*3/uL (ref 0.7–4.0)
MCHC: 32.9 g/dL (ref 30.0–36.0)
MCV: 90.1 fl (ref 78.0–100.0)
MONO ABS: 0.4 10*3/uL (ref 0.1–1.0)
Monocytes Relative: 8.2 % (ref 3.0–12.0)
NEUTROS PCT: 64.5 % (ref 43.0–77.0)
Neutro Abs: 3.5 10*3/uL (ref 1.4–7.7)
Platelets: 285 10*3/uL (ref 150.0–400.0)
RBC: 4.53 Mil/uL (ref 3.87–5.11)
RDW: 11.5 % (ref 11.5–15.5)
WBC: 5.4 10*3/uL (ref 4.0–10.5)

## 2014-06-15 LAB — LIPID PANEL
CHOL/HDL RATIO: 3
CHOLESTEROL: 165 mg/dL (ref 0–200)
HDL: 55.9 mg/dL (ref >39.00)
LDL Cholesterol: 100 mg/dL — ABNORMAL HIGH (ref 0–99)
NonHDL: 109.1
Triglycerides: 46 mg/dL (ref 0.0–149.0)
VLDL: 9.2 mg/dL (ref 0.0–40.0)

## 2014-06-15 LAB — BASIC METABOLIC PANEL
BUN: 12 mg/dL (ref 6–23)
CHLORIDE: 103 meq/L (ref 96–112)
CO2: 27 meq/L (ref 19–32)
CREATININE: 0.8 mg/dL (ref 0.4–1.2)
Calcium: 9 mg/dL (ref 8.4–10.5)
GFR: 80.1 mL/min (ref >60.00)
GLUCOSE: 87 mg/dL (ref 70–99)
POTASSIUM: 3.8 meq/L (ref 3.5–5.1)
Sodium: 137 mEq/L (ref 135–145)

## 2014-06-15 LAB — TSH: TSH: 0.94 u[IU]/mL (ref 0.35–4.50)

## 2014-06-15 MED ORDER — METHYLPHENIDATE HCL ER (OSM) 18 MG PO TBCR: 18.0000 mg | EXTENDED_RELEASE_TABLET | Freq: Every day | ORAL | Status: DC

## 2014-06-15 MED ORDER — METHYLPHENIDATE HCL 10 MG PO TABS: ORAL_TABLET | ORAL | Status: DC

## 2014-06-15 NOTE — Patient Instructions (Signed)
Follow up in 1 year or as needed We'll notify you of your lab results and make any changes if needed Keep up the good work!  You look amazing! Call with any questions or concerns Happy Holidays!

## 2014-06-15 NOTE — Progress Notes (Signed)
   Subjective:    Patient ID: Gloria Frazier, female    DOB: 10/29/1955, 58 y.o.   MRN: [id number]  HPI CPE- UTD on pap and mammo w/ Dr Nori Riis.  UTD on colonoscopy- plans to schedule w/ Dr Henrene Pastor at GI.  Pt has lost nearly 20 lbs in 6 months.   Review of Systems Patient reports no vision/ hearing changes, adenopathy,fever, weight change,  persistant/recurrent hoarseness , swallowing issues, chest pain, palpitations, edema, persistant/recurrent cough, hemoptysis, dyspnea (rest/exertional/paroxysmal nocturnal), gastrointestinal bleeding (melena, rectal bleeding), abdominal pain, significant heartburn, bowel changes, GU symptoms (dysuria, hematuria, incontinence), Gyn symptoms (abnormal  bleeding, pain),  syncope, focal weakness, memory loss, numbness & tingling, skin/hair/nail changes, abnormal bruising or bleeding, anxiety, or depression.     Objective:   Physical Exam General Appearance:    Alert, cooperative, no distress, appears stated age  Head:    Normocephalic, without obvious abnormality, atraumatic  Eyes:    PERRL, conjunctiva/corneas clear, EOM's intact, fundi    benign, both eyes  Ears:    Normal TM's and external ear canals, both ears  Nose:   Nares normal, septum midline, mucosa normal, no drainage    or sinus tenderness  Throat:   Lips, mucosa, and tongue normal; teeth and gums normal  Neck:   Supple, symmetrical, trachea midline, no adenopathy;    Thyroid: no enlargement/tenderness/nodules  Back:     Symmetric, no curvature, ROM normal, no CVA tenderness  Lungs:     Clear to auscultation bilaterally, respirations unlabored  Chest Wall:    No tenderness or deformity   Heart:    Regular rate and rhythm, S1 and S2 normal, no murmur, rub   or gallop  Breast Exam:    Deferred to GYN  Abdomen:     Soft, non-tender, bowel sounds active all four quadrants,    no masses, no organomegaly  Genitalia:    Deferred to GYN  Rectal:    Extremities:   Extremities normal, atraumatic, no  cyanosis or edema  Pulses:   2+ and symmetric all extremities  Skin:   Skin color, texture, turgor normal, no rashes or lesions  Lymph nodes:   Cervical, supraclavicular, and axillary nodes normal  Neurologic:   CNII-XII intact, normal strength, sensation and reflexes    throughout         Assessment & Plan:

## 2014-06-15 NOTE — Assessment & Plan Note (Signed)
Pt's PE WNL.  UTD on colonoscopy, GYN.  Check labs.  Anticipatory guidance provided.

## 2014-06-15 NOTE — Progress Notes (Signed)
Pre visit review using our clinic review tool, if applicable. No additional management support is needed unless otherwise documented below in the visit note. 

## 2014-08-23 LAB — HM MAMMOGRAPHY

## 2014-12-01 ENCOUNTER — Encounter: Payer: Self-pay | Admitting: Family Medicine

## 2015-05-02 ENCOUNTER — Telehealth: Payer: Self-pay | Admitting: Family Medicine

## 2015-05-02 NOTE — Telephone Encounter (Signed)
Caller name: Self   Can be reached: [phone]  Reason for call: Patient is requesting to have Shingles and Pneumonia Vaccine done at the same time and wants to know if there are any interactions. Plse Adv

## 2015-05-03 NOTE — Telephone Encounter (Signed)
Called patient back and LM informing her of below. Advised her to call the office to schedule Shingles Vac

## 2015-05-03 NOTE — Telephone Encounter (Signed)
Pt can have singles vaccination, not recommended for pneumonia vaccination until 88. Ok to schedule a nurse visit for shingles vaccination only

## 2015-05-09 ENCOUNTER — Ambulatory Visit (INDEPENDENT_AMBULATORY_CARE_PROVIDER_SITE_OTHER): Payer: No Typology Code available for payment source

## 2015-05-09 DIAGNOSIS — Z23 Encounter for immunization: Secondary | ICD-10-CM

## 2015-05-09 MED ORDER — ZOSTER VACCINE LIVE 19400 UNT/0.65ML ~~LOC~~ SOLR
0.6500 mL | Freq: Once | SUBCUTANEOUS | Status: AC
  Administered 2015-05-09: 19400 [IU] via SUBCUTANEOUS

## 2015-05-09 MED ORDER — ZOSTER VACCINE LIVE 19400 UNT/0.65ML ~~LOC~~ SOLR: 0.6500 mL | Freq: Once | SUBCUTANEOUS | Status: DC

## 2015-06-20 ENCOUNTER — Encounter: Payer: Self-pay | Admitting: Family Medicine

## 2015-06-29 ENCOUNTER — Ambulatory Visit (INDEPENDENT_AMBULATORY_CARE_PROVIDER_SITE_OTHER): Payer: 59 | Admitting: Family Medicine

## 2015-06-29 ENCOUNTER — Encounter: Payer: Self-pay | Admitting: Family Medicine

## 2015-06-29 VITALS — BP 105/71 | HR 65 | Temp 98.1°F | Resp 15 | Ht 65.0 in | Wt 142.6 lb

## 2015-06-29 DIAGNOSIS — Z0000 Encounter for general adult medical examination without abnormal findings: Secondary | ICD-10-CM | POA: Diagnosis not present

## 2015-06-29 LAB — HEPATIC FUNCTION PANEL
ALBUMIN: 4.1 g/dL (ref 3.5–5.2)
ALT: 21 U/L (ref 0–35)
AST: 24 U/L (ref 0–37)
Alkaline Phosphatase: 50 U/L (ref 39–117)
Bilirubin, Direct: 0.1 mg/dL (ref 0.0–0.3)
TOTAL PROTEIN: 6.8 g/dL (ref 6.0–8.3)
Total Bilirubin: 0.7 mg/dL (ref 0.2–1.2)

## 2015-06-29 LAB — CBC WITH DIFFERENTIAL/PLATELET
BASOS ABS: 0 10*3/uL (ref 0.0–0.1)
BASOS PCT: 0.5 % (ref 0.0–3.0)
EOS ABS: 0.1 10*3/uL (ref 0.0–0.7)
Eosinophils Relative: 1.8 % (ref 0.0–5.0)
HCT: 40.7 % (ref 36.0–46.0)
HEMOGLOBIN: 13.5 g/dL (ref 12.0–15.0)
LYMPHS PCT: 25.9 % (ref 12.0–46.0)
Lymphs Abs: 1.7 10*3/uL (ref 0.7–4.0)
MCHC: 33.2 g/dL (ref 30.0–36.0)
MCV: 92.3 fl (ref 78.0–100.0)
MONO ABS: 0.5 10*3/uL (ref 0.1–1.0)
Monocytes Relative: 7.1 % (ref 3.0–12.0)
Neutro Abs: 4.3 10*3/uL (ref 1.4–7.7)
Neutrophils Relative %: 64.7 % (ref 43.0–77.0)
Platelets: 303 10*3/uL (ref 150.0–400.0)
RBC: 4.41 Mil/uL (ref 3.87–5.11)
RDW: 11.8 % (ref 11.5–15.5)
WBC: 6.6 10*3/uL (ref 4.0–10.5)

## 2015-06-29 LAB — LIPID PANEL
Cholesterol: 198 mg/dL (ref 0–200)
HDL: 94.8 mg/dL (ref >39.00)
LDL Cholesterol: 94 mg/dL (ref 0–99)
NonHDL: 102.75
TRIGLYCERIDES: 44 mg/dL (ref 0.0–149.0)
Total CHOL/HDL Ratio: 2
VLDL: 8.8 mg/dL (ref 0.0–40.0)

## 2015-06-29 LAB — TSH: TSH: 1.15 u[IU]/mL (ref 0.35–4.50)

## 2015-06-29 LAB — BASIC METABOLIC PANEL
BUN: 18 mg/dL (ref 6–23)
CALCIUM: 9.2 mg/dL (ref 8.4–10.5)
CHLORIDE: 104 meq/L (ref 96–112)
CO2: 29 meq/L (ref 19–32)
CREATININE: 0.77 mg/dL (ref 0.40–1.20)
GFR: 81.01 mL/min (ref >60.00)
Glucose, Bld: 86 mg/dL (ref 70–99)
Potassium: 3.8 mEq/L (ref 3.5–5.1)
Sodium: 139 mEq/L (ref 135–145)

## 2015-06-29 LAB — VITAMIN D 25 HYDROXY (VIT D DEFICIENCY, FRACTURES): VITD: 43.79 ng/mL (ref 30.00–100.00)

## 2015-06-29 NOTE — Progress Notes (Signed)
Pre visit review using our clinic review tool, if applicable. No additional management support is needed unless otherwise documented below in the visit note. 

## 2015-06-29 NOTE — Patient Instructions (Signed)
Follow up in 1 year or as needed We'll notify you of your lab results and make any changes if needed Keep up the good work!  You look great!! Call with any questions or concerns If you want to join [place] at the new [place] office, any scheduled appointments will automatically transfer and we will see you at [address] [place] [address], [place], [place] [address] (OPENING FEB) Happy New Year!!!

## 2015-06-29 NOTE — Progress Notes (Signed)
   Subjective:    Patient ID: Gloria Frazier, female    DOB: 05-15-1956, 60 y.o.   MRN: [id number]  HPI CPE- pt reports UTD on mammo w/ GYN (Dr Nori Riis).  Due for colonoscopy next year b/c pt had one 2008 w/ Dr Earlean Shawl.     Review of Systems Patient reports no vision/ hearing changes, adenopathy,fever, weight change,  persistant/recurrent hoarseness , swallowing issues, chest pain, palpitations, edema, persistant/recurrent cough, hemoptysis, dyspnea (rest/exertional/paroxysmal nocturnal), gastrointestinal bleeding (melena, rectal bleeding), abdominal pain, significant heartburn, bowel changes, GU symptoms (dysuria, hematuria, incontinence), Gyn symptoms (abnormal  bleeding, pain),  syncope, focal weakness, memory loss, numbness & tingling, skin/hair/nail changes, abnormal bruising or bleeding, anxiety, or depression.     Objective:   Physical Exam General Appearance:    Alert, cooperative, no distress, appears stated age  Head:    Normocephalic, without obvious abnormality, atraumatic  Eyes:    PERRL, conjunctiva/corneas clear, EOM's intact, fundi    benign, both eyes  Ears:    Normal TM's and external ear canals, both ears  Nose:   Nares normal, septum midline, mucosa normal, no drainage    or sinus tenderness  Throat:   Lips, mucosa, and tongue normal; teeth and gums normal  Neck:   Supple, symmetrical, trachea midline, no adenopathy;    Thyroid: no enlargement/tenderness/nodules  Back:     Symmetric, no curvature, ROM normal, no CVA tenderness  Lungs:     Clear to auscultation bilaterally, respirations unlabored  Chest Wall:    No tenderness or deformity   Heart:    Regular rate and rhythm, S1 and S2 normal, no murmur, rub   or gallop  Breast Exam:    Deferred to GYN  Abdomen:     Soft, non-tender, bowel sounds active all four quadrants,    no masses, no organomegaly  Genitalia:    Deferred to GYN  Rectal:    Extremities:   Extremities normal, atraumatic, no cyanosis or edema    Pulses:   2+ and symmetric all extremities  Skin:   Skin color, texture, turgor normal, no rashes or lesions  Lymph nodes:   Cervical, supraclavicular, and axillary nodes normal  Neurologic:   CNII-XII intact, normal strength, sensation and reflexes    throughout          Assessment & Plan:

## 2015-06-29 NOTE — Assessment & Plan Note (Signed)
Pt's PE WNL.  UTD on GYN.  UTD on colonoscopy- due next year (2018).  Check labs.  Anticipatory guidance provided.

## 2015-08-23 LAB — HM MAMMOGRAPHY: HM Mammogram: NORMAL (ref 0–4)

## 2015-08-23 LAB — HM PAP SMEAR

## 2016-04-30 ENCOUNTER — Ambulatory Visit: Payer: 59

## 2016-05-01 ENCOUNTER — Ambulatory Visit (INDEPENDENT_AMBULATORY_CARE_PROVIDER_SITE_OTHER): Payer: 59

## 2016-05-01 DIAGNOSIS — Z23 Encounter for immunization: Secondary | ICD-10-CM

## 2016-06-22 ENCOUNTER — Other Ambulatory Visit: Payer: Self-pay | Admitting: Rheumatology

## 2016-06-25 ENCOUNTER — Other Ambulatory Visit: Payer: Self-pay | Admitting: Radiology

## 2016-06-25 ENCOUNTER — Telehealth: Payer: Self-pay | Admitting: Rheumatology

## 2016-06-25 DIAGNOSIS — Z79899 Other long term (current) drug therapy: Secondary | ICD-10-CM

## 2016-06-25 MED ORDER — MELOXICAM 7.5 MG PO TABS: 7.5000 mg | ORAL_TABLET | Freq: Two times a day (BID) | ORAL | 0 refills | Status: DC

## 2016-06-25 NOTE — Telephone Encounter (Signed)
Dr Birdie Riddle is in epic, have placed orders.

## 2016-06-25 NOTE — Telephone Encounter (Signed)
Patient states she will have labs this week with Dr Birdie Riddle, [facility] to refill per Dr Estanislado Pandy  One month supply

## 2016-06-25 NOTE — Telephone Encounter (Signed)
Patient has an appt with her PCP Annye Asa on 07/01/16 and would like her lab orders sent there since she has to have lab work done [employer].

## 2016-06-25 NOTE — Telephone Encounter (Signed)
Patient due for labs for Meloxicam refill will call patient Message sent to front desk for appt scheduling

## 2016-07-01 ENCOUNTER — Ambulatory Visit (INDEPENDENT_AMBULATORY_CARE_PROVIDER_SITE_OTHER): Payer: 59 | Admitting: Family Medicine

## 2016-07-01 ENCOUNTER — Encounter: Payer: Self-pay | Admitting: Family Medicine

## 2016-07-01 VITALS — BP 112/76 | HR 72 | Temp 98.3°F | Resp 16 | Ht 65.0 in | Wt 148.0 lb

## 2016-07-01 DIAGNOSIS — Z0000 Encounter for general adult medical examination without abnormal findings: Secondary | ICD-10-CM

## 2016-07-01 LAB — LIPID PANEL
CHOL/HDL RATIO: 2
Cholesterol: 199 mg/dL (ref 0–200)
HDL: 98.1 mg/dL (ref >39.00)
LDL Cholesterol: 84 mg/dL (ref 0–99)
NONHDL: 100.61
Triglycerides: 83 mg/dL (ref 0.0–149.0)
VLDL: 16.6 mg/dL (ref 0.0–40.0)

## 2016-07-01 LAB — VITAMIN D 25 HYDROXY (VIT D DEFICIENCY, FRACTURES): VITD: 62.24 ng/mL (ref 30.00–100.00)

## 2016-07-01 LAB — CBC WITH DIFFERENTIAL/PLATELET
BASOS PCT: 0.4 % (ref 0.0–3.0)
Basophils Absolute: 0 10*3/uL (ref 0.0–0.1)
EOS PCT: 2 % (ref 0.0–5.0)
Eosinophils Absolute: 0.2 10*3/uL (ref 0.0–0.7)
HEMATOCRIT: 40.5 % (ref 36.0–46.0)
HEMOGLOBIN: 13.6 g/dL (ref 12.0–15.0)
LYMPHS PCT: 23.7 % (ref 12.0–46.0)
Lymphs Abs: 1.8 10*3/uL (ref 0.7–4.0)
MCHC: 33.6 g/dL (ref 30.0–36.0)
MCV: 92.6 fl (ref 78.0–100.0)
MONO ABS: 0.6 10*3/uL (ref 0.1–1.0)
Monocytes Relative: 7.8 % (ref 3.0–12.0)
Neutro Abs: 5 10*3/uL (ref 1.4–7.7)
Neutrophils Relative %: 66.1 % (ref 43.0–77.0)
Platelets: 315 10*3/uL (ref 150.0–400.0)
RBC: 4.37 Mil/uL (ref 3.87–5.11)
RDW: 12.2 % (ref 11.5–15.5)
WBC: 7.5 10*3/uL (ref 4.0–10.5)

## 2016-07-01 LAB — HEPATIC FUNCTION PANEL
ALT: 23 U/L (ref 0–35)
AST: 23 U/L (ref 0–37)
Albumin: 4.3 g/dL (ref 3.5–5.2)
Alkaline Phosphatase: 52 U/L (ref 39–117)
BILIRUBIN TOTAL: 0.6 mg/dL (ref 0.2–1.2)
Bilirubin, Direct: 0.1 mg/dL (ref 0.0–0.3)
Total Protein: 6.7 g/dL (ref 6.0–8.3)

## 2016-07-01 LAB — BASIC METABOLIC PANEL
BUN: 19 mg/dL (ref 6–23)
CO2: 28 mEq/L (ref 19–32)
CREATININE: 0.83 mg/dL (ref 0.40–1.20)
Calcium: 9.4 mg/dL (ref 8.4–10.5)
Chloride: 103 mEq/L (ref 96–112)
GFR: 74.05 mL/min (ref >60.00)
Glucose, Bld: 89 mg/dL (ref 70–99)
Potassium: 4.2 mEq/L (ref 3.5–5.1)
Sodium: 139 mEq/L (ref 135–145)

## 2016-07-01 LAB — TSH: TSH: 1.25 u[IU]/mL (ref 0.35–4.50)

## 2016-07-01 MED ORDER — METHYLPHENIDATE HCL 10 MG PO TABS: ORAL_TABLET | ORAL | 0 refills | Status: DC

## 2016-07-01 MED ORDER — METHYLPHENIDATE HCL ER (OSM) 18 MG PO TBCR: 18.0000 mg | EXTENDED_RELEASE_TABLET | Freq: Every day | ORAL | 0 refills | Status: DC

## 2016-07-01 NOTE — Progress Notes (Signed)
Pre visit review using our clinic review tool, if applicable. No additional management support is needed unless otherwise documented below in the visit note. 

## 2016-07-01 NOTE — Assessment & Plan Note (Signed)
Pt's PE WNL.  UTD on GYN, immunizations, colonoscopy.  Check labs.  Anticipatory guidance provided.  

## 2016-07-01 NOTE — Patient Instructions (Signed)
Follow up in 6 months to recheck ADHD We'll notify you of your lab results and make any changes if needed Continue to work on healthy diet and regular exercise- you look great!!! Restart the Methylphenidate extended release daily and then the short acting as needed Call with any questions or concerns Happy New Year! Happy Early [name]!!!

## 2016-07-01 NOTE — Progress Notes (Signed)
   Subjective:    Patient ID: Gloria Frazier, female    DOB: 01-Nov-1955, 61 y.o.   MRN: [id number]  HPI CPE- UTD on pap/mammo 08/2015 at GYN, due for colonoscopy next year.  No concerns today.  Pt is interested in restarting her methylphenidate daily b/c they are attempting to relocate and she is having difficulty w/ task completion.   Review of Systems Patient reports no vision/ hearing changes, adenopathy,fever, weight change,  persistant/recurrent hoarseness , swallowing issues, chest pain, palpitations, edema, persistant/recurrent cough, hemoptysis, dyspnea (rest/exertional/paroxysmal nocturnal), gastrointestinal bleeding (melena, rectal bleeding), abdominal pain, significant heartburn, bowel changes, GU symptoms (dysuria, hematuria, incontinence), Gyn symptoms (abnormal  bleeding, pain),  syncope, focal weakness, memory loss, numbness & tingling, skin/hair/nail changes, abnormal bruising or bleeding, anxiety, or depression.     Objective:   Physical Exam General Appearance:    Alert, cooperative, no distress, appears stated age  Head:    Normocephalic, without obvious abnormality, atraumatic  Eyes:    PERRL, conjunctiva/corneas clear, EOM's intact, fundi    benign, both eyes  Ears:    Normal TM's and external ear canals, both ears  Nose:   Nares normal, septum midline, mucosa normal, no drainage    or sinus tenderness  Throat:   Lips, mucosa, and tongue normal; teeth and gums normal  Neck:   Supple, symmetrical, trachea midline, no adenopathy;    Thyroid: no enlargement/tenderness/nodules  Back:     Symmetric, no curvature, ROM normal, no CVA tenderness  Lungs:     Clear to auscultation bilaterally, respirations unlabored  Chest Wall:    No tenderness or deformity   Heart:    Regular rate and rhythm, S1 and S2 normal, no murmur, rub   or gallop  Breast Exam:    Deferred to GYN  Abdomen:     Soft, non-tender, bowel sounds active all four quadrants,    no masses, no organomegaly    Genitalia:    Deferred to GYN  Rectal:    Extremities:   Extremities normal, atraumatic, no cyanosis or edema  Pulses:   2+ and symmetric all extremities  Skin:   Skin color, texture, turgor normal, no rashes or lesions  Lymph nodes:   Cervical, supraclavicular, and axillary nodes normal  Neurologic:   CNII-XII intact, normal strength, sensation and reflexes    throughout          Assessment & Plan:

## 2016-07-02 ENCOUNTER — Encounter: Payer: Self-pay | Admitting: General Practice

## 2016-07-02 ENCOUNTER — Telehealth: Payer: Self-pay | Admitting: General Practice

## 2016-07-02 NOTE — Telephone Encounter (Signed)
Ritalin rx approved through covermymeds from 07/02/2016 until 07/02/2017.

## 2016-07-22 ENCOUNTER — Other Ambulatory Visit: Payer: Self-pay | Admitting: Rheumatology

## 2016-07-22 NOTE — Telephone Encounter (Signed)
ok 

## 2016-07-22 NOTE — Telephone Encounter (Signed)
Last Visit: 02/19/16 Next Visit: 07/26/16 Labs: 07/01/16 WNL  Okay to refill Meloxicam?

## 2016-07-26 ENCOUNTER — Encounter: Payer: Self-pay | Admitting: Rheumatology

## 2016-07-26 ENCOUNTER — Ambulatory Visit (INDEPENDENT_AMBULATORY_CARE_PROVIDER_SITE_OTHER): Payer: 59 | Admitting: Rheumatology

## 2016-07-26 ENCOUNTER — Telehealth: Payer: Self-pay | Admitting: Rheumatology

## 2016-07-26 VITALS — BP 100/58 | HR 78 | Resp 14 | Ht 65.0 in | Wt 150.0 lb

## 2016-07-26 DIAGNOSIS — M160 Bilateral primary osteoarthritis of hip: Secondary | ICD-10-CM | POA: Diagnosis not present

## 2016-07-26 DIAGNOSIS — M2142 Flat foot [pes planus] (acquired), left foot: Secondary | ICD-10-CM | POA: Diagnosis not present

## 2016-07-26 DIAGNOSIS — M2480 Other specific joint derangements of unspecified joint, not elsewhere classified: Secondary | ICD-10-CM

## 2016-07-26 DIAGNOSIS — M2141 Flat foot [pes planus] (acquired), right foot: Secondary | ICD-10-CM | POA: Diagnosis not present

## 2016-07-26 DIAGNOSIS — M19042 Primary osteoarthritis, left hand: Secondary | ICD-10-CM

## 2016-07-26 DIAGNOSIS — M19041 Primary osteoarthritis, right hand: Secondary | ICD-10-CM

## 2016-07-26 NOTE — Telephone Encounter (Signed)
Attempted to contact the patient and number is busy.  °

## 2016-07-26 NOTE — Progress Notes (Signed)
Office Visit Note  Patient: Gloria Frazier             Date of Birth: 1955/10/23           MRN: [id number]             PCP: Annye Asa, MD Referring: Midge Minium, MD Visit Date: 07/26/2016 Occupation: @GUAROCC @    Subjective:  Follow-up Osteoarthritis follow-up  History of Present Illness: Gloria Frazier is a 61 y.o. female  Last visit 02/19/2016. Patient is doing well with osteoarthritis of the hands. Continues to do exercises and joint sparing activities. She has a history of OA of the hip joint. She saw an orthopedist who said that she is bone-on-bone in the right hip joint. She is able to manage the pain with meloxicam and proper strengthening exercises and currently she is pain-free. She is well aware to go get total hip replacement surgery when the time comes but she does not need at this time.  She is using meloxicam 7.5 mg. We wrote the prescription for twice a day but she is only using it once a day. We discussed adding Tylenol to every other day and skipping her meloxicam dose during the Tylenol days and patient is agreeable. Effectively that means that she'll be decreasing her meloxicam use by 50%.  We also discussed OA supplements in detail. She is only taking omega-3 at this time. She's not on tart Cherry or ginger. She also is wondering about turmeric and we discussed that in detail.   Activities of Daily Living:  Patient reports morning stiffness for 15 minutes.   Patient Denies nocturnal pain.  Difficulty dressing/grooming: Denies Difficulty climbing stairs: Denies Difficulty getting out of chair: Denies Difficulty using hands for taps, buttons, cutlery, and/or writing: Denies   Review of Systems  Constitutional: Negative for fatigue.  HENT: Negative for mouth sores and mouth dryness.   Eyes: Negative for dryness.  Respiratory: Negative for shortness of breath.   Gastrointestinal: Negative for constipation and diarrhea.  Musculoskeletal:  Negative for myalgias and myalgias.  Skin: Negative for sensitivity to sunlight.  Psychiatric/Behavioral: Negative for decreased concentration and sleep disturbance.    PMFS History:  Patient Active Problem List   Diagnosis Date Noted  . Abdominal pain, epigastric 12/13/2013  . Routine general medical examination at a health care facility 06/10/2013  . ADHD (attention deficit hyperactivity disorder), combined type 03/09/2012  . Hip arthritis 03/09/2012  . Contact dermatitis 03/09/2012  . GERD (gastroesophageal reflux disease) 10/13/2011  . Bunion of great toe 10/13/2011  . Bruise of toe 10/13/2011    Past Medical History:  Diagnosis Date  . ADHD (attention deficit hyperactivity disorder)   . Chicken pox     Family History  Problem Relation Age of Onset  . Colon cancer Neg Hx   . Esophageal cancer Neg Hx   . Rectal cancer Neg Hx   . Stomach cancer Neg Hx    Past Surgical History:  Procedure Laterality Date  . ABDOMINAL HYSTERECTOMY    . BUNIONECTOMY  dec 2013   Social History   Social History Narrative  . No narrative on file     Objective: Vital Signs: BP (!) 100/58   Pulse 78   Resp 14   Ht 5\' 5"  (1.651 m)   Wt 150 lb (68 kg)   BMI 24.96 kg/m    Physical Exam  Constitutional: She is oriented to person, place, and time. She appears well-developed and well-nourished.  HENT:  Head: Normocephalic and atraumatic.  Eyes: EOM are normal. Pupils are equal, round, and reactive to light.  Cardiovascular: Normal rate, regular rhythm and normal heart sounds.  Exam reveals no gallop and no friction rub.   No murmur heard. Pulmonary/Chest: Effort normal and breath sounds normal. She has no wheezes. She has no rales.  Abdominal: Soft. Bowel sounds are normal. She exhibits no distension. There is no tenderness. There is no guarding. No hernia.  Musculoskeletal: Normal range of motion. She exhibits no edema, tenderness or deformity.  Lymphadenopathy:    She has no  cervical adenopathy.  Neurological: She is alert and oriented to person, place, and time. Coordination normal.  Skin: Skin is warm and dry. Capillary refill takes less than 2 seconds. No rash noted.  Psychiatric: She has a normal mood and affect. Her behavior is normal.  Nursing note and vitals reviewed.    Musculoskeletal Exam:  Full range of motion of all joints Grip strength is equal and strong bilaterally Fiber myalgia tender points are all absent  CDAI Exam: CDAI Homunculus Exam:   Joint Counts:  CDAI Tender Joint count: 0 CDAI Swollen Joint count: 0  Global Assessments:  Patient Global Assessment: 0 Provider Global Assessment: 0  No synovitis on examination  Investigation: No additional findings.  Patient had labs and a Dr. Virgil Benedict office in January 2018. CBC with differential and CMP with GFR within normal limits.  Office Visit on 07/01/2016  Component Date Value Ref Range Status  . HM Mammogram 08/23/2015 Self Reported Normal  0-4 Bi-Rad, Self Reported Normal Final  . HM Pap smear 08/23/2015 completed   Final  . Cholesterol 07/01/2016 199  0 - 200 mg/dL Final  . Triglycerides 07/01/2016 83.0  0.0 - 149.0 mg/dL Final  . HDL 07/01/2016 98.10  >39.00 mg/dL Final  . VLDL 07/01/2016 16.6  0.0 - 40.0 mg/dL Final  . LDL Cholesterol 07/01/2016 84  0 - 99 mg/dL Final  . Total CHOL/HDL Ratio 07/01/2016 2   Final  . NonHDL 07/01/2016 100.61   Final  . Sodium 07/01/2016 139  135 - 145 mEq/L Final  . Potassium 07/01/2016 4.2  3.5 - 5.1 mEq/L Final  . Chloride 07/01/2016 103  96 - 112 mEq/L Final  . CO2 07/01/2016 28  19 - 32 mEq/L Final  . Glucose, Bld 07/01/2016 89  70 - 99 mg/dL Final  . BUN 07/01/2016 19  6 - 23 mg/dL Final  . Creatinine, Ser 07/01/2016 0.83  0.40 - 1.20 mg/dL Final  . Calcium 07/01/2016 9.4  8.4 - 10.5 mg/dL Final  . GFR 07/01/2016 74.05  >60.00 mL/min Final  . TSH 07/01/2016 1.25  0.35 - 4.50 uIU/mL Final  . Total Bilirubin 07/01/2016 0.6  0.2 -  1.2 mg/dL Final  . Bilirubin, Direct 07/01/2016 0.1  0.0 - 0.3 mg/dL Final  . Alkaline Phosphatase 07/01/2016 52  39 - 117 U/L Final  . AST 07/01/2016 23  0 - 37 U/L Final  . ALT 07/01/2016 23  0 - 35 U/L Final  . Total Protein 07/01/2016 6.7  6.0 - 8.3 g/dL Final  . Albumin 07/01/2016 4.3  3.5 - 5.2 g/dL Final  . WBC 07/01/2016 7.5  4.0 - 10.5 K/uL Final  . RBC 07/01/2016 4.37  3.87 - 5.11 Mil/uL Final  . Hemoglobin 07/01/2016 13.6  12.0 - 15.0 g/dL Final  . HCT 07/01/2016 40.5  36.0 - 46.0 % Final  . MCV 07/01/2016 92.6  78.0 - 100.0 fl Final  .  MCHC 07/01/2016 33.6  30.0 - 36.0 g/dL Final  . RDW 07/01/2016 12.2  11.5 - 15.5 % Final  . Platelets 07/01/2016 315.0  150.0 - 400.0 K/uL Final  . Neutrophils Relative % 07/01/2016 66.1  43.0 - 77.0 % Final  . Lymphocytes Relative 07/01/2016 23.7  12.0 - 46.0 % Final  . Monocytes Relative 07/01/2016 7.8  3.0 - 12.0 % Final  . Eosinophils Relative 07/01/2016 2.0  0.0 - 5.0 % Final  . Basophils Relative 07/01/2016 0.4  0.0 - 3.0 % Final  . Neutro Abs 07/01/2016 5.0  1.4 - 7.7 K/uL Final  . Lymphs Abs 07/01/2016 1.8  0.7 - 4.0 K/uL Final  . Monocytes Absolute 07/01/2016 0.6  0.1 - 1.0 K/uL Final  . Eosinophils Absolute 07/01/2016 0.2  0.0 - 0.7 K/uL Final  . Basophils Absolute 07/01/2016 0.0  0.0 - 0.1 K/uL Final  . VITD 07/01/2016 62.24  30.00 - 100.00 ng/mL Final     Imaging: No results found.  Speciality Comments: No specialty comments available.    Procedures:  No procedures performed Allergies: Patient has no known allergies.   Assessment / Plan:     Visit Diagnoses: Primary osteoarthritis of both hips  Primary osteoarthritis of both hands  Pes planus of both feet  Generalized hypermobility of joints    Plan: #1: History of OA of the hands. Patient is doing hand exercises and doing well.  #2: Using prescription medication sparingly. Patient is aware of the side effects. meloxicam 7.5mg  qd when necessary I advised  the patient that she can add Tylenol every other day and then use meloxicam every other day which will minimize the overall meloxicam use and the Tylenol use. Patient is agreeable. I advised her that 500 mg tablets of Tylenol 4 pills per day can be taken.  #3: Return to clinic in 5 months  #4: Currently patient's pain in the right hip is doing really well. She does not need any surgery. She is exercising and strengthening the muscles well.  Orders: No orders of the defined types were placed in this encounter.  No orders of the defined types were placed in this encounter.   Face-to-face time spent with patient was 30 minutes. 50% of time was spent in counseling and coordination of care.  Follow-Up Instructions: Return for oa hands & hips; declined THR on aug 2017; meloxicam; oa supplement.   Eliezer Lofts, PA-C  Note - This record has been created using [employer].  Chart creation errors have been sought, but may not always  have been located. Such creation errors do not reflect on  the standard of medical care.

## 2016-07-26 NOTE — Telephone Encounter (Signed)
Patient would like to make sure [facility] [address].

## 2016-07-30 NOTE — Telephone Encounter (Signed)
IT band exercises.

## 2016-07-30 NOTE — Telephone Encounter (Signed)
Mailed exercises to patient

## 2016-07-30 NOTE — Telephone Encounter (Signed)
Patient advised [facility] [address]. Patient states Mr. Carlyon Shadow mentioned some hip stretches when she was in the office for her appointment. Patient is asking if we can mail those to her. Which exercise would you like for her to have?

## 2016-08-18 ENCOUNTER — Other Ambulatory Visit: Payer: Self-pay | Admitting: Rheumatology

## 2016-08-19 NOTE — Telephone Encounter (Signed)
ok 

## 2016-08-19 NOTE — Telephone Encounter (Signed)
Last Visit: 07/26/16 Next Visit: 01/24/17 Labs: 07/01/16 WNL  Okay to refill Meloxicam?

## 2016-09-26 ENCOUNTER — Other Ambulatory Visit: Payer: Self-pay | Admitting: *Deleted

## 2016-09-26 MED ORDER — METHYLPHENIDATE HCL 10 MG PO TABS: ORAL_TABLET | ORAL | 0 refills | Status: DC

## 2016-09-26 MED ORDER — METHYLPHENIDATE HCL ER (OSM) 18 MG PO TBCR: 18.0000 mg | EXTENDED_RELEASE_TABLET | Freq: Every day | ORAL | 0 refills | Status: DC

## 2016-09-26 NOTE — Telephone Encounter (Signed)
Patient requesting refill on Methylphenidate, both the 18mg  and the 10mg .  For callback, please call the cell phone number listed.

## 2016-09-26 NOTE — Telephone Encounter (Signed)
Last OV 07/02/16 Methylphenidate 10mg  last filled 07/01/16 #30 with 0 Methylphenidate 18mg  last filled 07/01/16 #30 with 0  CSC signed, UDS needed

## 2016-09-26 NOTE — Addendum Note (Signed)
Addended by: Desmond Dike L on: 09/26/2016 11:12 AM   Modules accepted: Orders

## 2016-09-26 NOTE — Telephone Encounter (Signed)
Pt notified, rx available at front desk.

## 2016-10-03 DIAGNOSIS — N6459 Other signs and symptoms in breast: Secondary | ICD-10-CM | POA: Diagnosis not present

## 2016-10-12 ENCOUNTER — Other Ambulatory Visit: Payer: Self-pay | Admitting: Rheumatology

## 2016-10-14 NOTE — Telephone Encounter (Signed)
07/26/16 last visit  Next visit 01/24/17 07/01/16 labs (CBC BMP hep. fx) WNL from PCP  Ok to refill per Dr Estanislado Pandy

## 2016-11-17 ENCOUNTER — Other Ambulatory Visit: Payer: Self-pay | Admitting: Rheumatology

## 2016-11-19 NOTE — Telephone Encounter (Signed)
07/26/16 last visit  Next visit 01/24/17 07/01/16 labs (CBC BMP hep. fx) WNL from PCP   Okay to refill Mobic?

## 2016-12-18 ENCOUNTER — Other Ambulatory Visit: Payer: Self-pay | Admitting: Rheumatology

## 2016-12-18 DIAGNOSIS — Z1231 Encounter for screening mammogram for malignant neoplasm of breast: Secondary | ICD-10-CM | POA: Diagnosis not present

## 2016-12-18 DIAGNOSIS — Z01419 Encounter for gynecological examination (general) (routine) without abnormal findings: Secondary | ICD-10-CM | POA: Diagnosis not present

## 2016-12-18 NOTE — Telephone Encounter (Signed)
Last Visit: 07/26/16 Next Visit: 01/24/17 Labs: 07/01/16 WNL  Okay to refill per Dr. Estanislado Pandy

## 2016-12-19 ENCOUNTER — Other Ambulatory Visit: Payer: Self-pay | Admitting: Obstetrics & Gynecology

## 2016-12-19 DIAGNOSIS — R928 Other abnormal and inconclusive findings on diagnostic imaging of breast: Secondary | ICD-10-CM

## 2016-12-24 ENCOUNTER — Ambulatory Visit
Admission: RE | Admit: 2016-12-24 | Discharge: 2016-12-24 | Disposition: A | Discharge status: Home / Self Care | Payer: 59 | Source: Ambulatory Visit | Attending: Obstetrics & Gynecology | Admitting: Obstetrics & Gynecology

## 2016-12-24 ENCOUNTER — Other Ambulatory Visit: Payer: Self-pay | Admitting: Obstetrics & Gynecology

## 2016-12-24 DIAGNOSIS — N6489 Other specified disorders of breast: Secondary | ICD-10-CM

## 2016-12-24 DIAGNOSIS — R928 Other abnormal and inconclusive findings on diagnostic imaging of breast: Secondary | ICD-10-CM

## 2016-12-24 DIAGNOSIS — R922 Inconclusive mammogram: Secondary | ICD-10-CM | POA: Diagnosis not present

## 2016-12-24 DIAGNOSIS — N648 Other specified disorders of breast: Secondary | ICD-10-CM | POA: Diagnosis not present

## 2016-12-30 ENCOUNTER — Ambulatory Visit (INDEPENDENT_AMBULATORY_CARE_PROVIDER_SITE_OTHER): Payer: 59 | Admitting: Family Medicine

## 2016-12-30 ENCOUNTER — Encounter: Payer: Self-pay | Admitting: Family Medicine

## 2016-12-30 VITALS — BP 126/82 | HR 60 | Resp 16 | Ht 65.0 in | Wt 150.4 lb

## 2016-12-30 DIAGNOSIS — F902 Attention-deficit hyperactivity disorder, combined type: Secondary | ICD-10-CM

## 2016-12-30 LAB — HM MAMMOGRAPHY: HM Mammogram: NORMAL (ref 0–4)

## 2016-12-30 MED ORDER — METHYLPHENIDATE HCL ER (OSM) 18 MG PO TBCR: 18.0000 mg | EXTENDED_RELEASE_TABLET | Freq: Every day | ORAL | 0 refills | Status: DC

## 2016-12-30 MED ORDER — METHYLPHENIDATE HCL 10 MG PO TABS: ORAL_TABLET | ORAL | 0 refills | Status: DC

## 2016-12-30 NOTE — Patient Instructions (Signed)
Schedule your complete physical in 6 months Continue the Methylphenidate as needed for focus Call with any questions or concerns Have a great summer!!!

## 2016-12-30 NOTE — Progress Notes (Signed)
   Subjective:    Patient ID: Lowell Guitar, female    DOB: July 05, 1955, 61 y.o.   MRN: [id number]  HPI ADHD- chronic problem, on Methylphenidate CR 18mg  daily and short acting 10mg  as needed at lunch for extra control.  Pt reports medication 'works great'.  The 'dose is perfect when I need it'.  No palpitations.  No insomnia.  Denies anorexia or weight change.  No change in anxiety level.   Review of Systems For ROS see HPI     Objective:   Physical Exam  Constitutional: She is oriented to person, place, and time. She appears well-developed and well-nourished.  HENT:  Head: Normocephalic and atraumatic.  Neck: Normal range of motion. Neck supple. No thyromegaly present.  Cardiovascular: Normal rate, regular rhythm, normal heart sounds and intact distal pulses.   Pulmonary/Chest: Effort normal and breath sounds normal. No respiratory distress. She has no wheezes. She has no rales.  Neurological: She is alert and oriented to person, place, and time.  Skin: Skin is warm and dry.  Psychiatric: She has a normal mood and affect. Her behavior is normal. Thought content normal.  Vitals reviewed.         Assessment & Plan:

## 2016-12-30 NOTE — Progress Notes (Signed)
Pre visit review using our clinic review tool, if applicable. No additional management support is needed unless otherwise documented below in the visit note. 

## 2016-12-30 NOTE — Assessment & Plan Note (Signed)
Chronic problem.  Pt is doing very well on current doses.  No side effects.  Refills provided.  Will continue to follow.

## 2017-01-24 ENCOUNTER — Ambulatory Visit: Payer: 59 | Admitting: Rheumatology

## 2017-01-25 ENCOUNTER — Other Ambulatory Visit: Payer: Self-pay | Admitting: Rheumatology

## 2017-01-27 NOTE — Telephone Encounter (Signed)
Labs are due now. Okay to give 30 day supply

## 2017-01-27 NOTE — Telephone Encounter (Signed)
Last Visit: 07/26/16 Next Visit: 02/03/17 Labs: 07/01/16 WNL  Okay to refill 30 day supply Mobic?

## 2017-01-28 DIAGNOSIS — M2480 Other specific joint derangements of unspecified joint, not elsewhere classified: Secondary | ICD-10-CM | POA: Insufficient documentation

## 2017-01-28 DIAGNOSIS — M160 Bilateral primary osteoarthritis of hip: Secondary | ICD-10-CM | POA: Insufficient documentation

## 2017-01-28 DIAGNOSIS — M19041 Primary osteoarthritis, right hand: Secondary | ICD-10-CM | POA: Insufficient documentation

## 2017-01-28 DIAGNOSIS — M19042 Primary osteoarthritis, left hand: Principal | ICD-10-CM

## 2017-01-28 DIAGNOSIS — M2140 Flat foot [pes planus] (acquired), unspecified foot: Secondary | ICD-10-CM | POA: Insufficient documentation

## 2017-01-28 NOTE — Progress Notes (Signed)
Office Visit Note  Patient: Gloria Frazier             Date of Birth: Mar 30, 1956           MRN: [id number]             PCP: Gloria Minium, MD Referring: Gloria Minium, MD Visit Date: 02/03/2017 Occupation: @GUAROCC @    Subjective:  Osteoarthritis (Right hip pain)   History of Present Illness: Gloria Frazier is a 61 y.o. female history of osteoarthritis. She continues to have discomfort in her right hip due to osteoarthritis. She states she went to see Dr. Alvan Dame and got to see his PA. She was told that she can come back for total hip replacement when she is ready. She states she's been very active last week and has experienced some discomfort in her left knee due to compensation for the right hip. Her hands and feet or doing okay currently.  Activities of Daily Living:  Patient reports morning stiffness for 2 minutes.   Patient Denies nocturnal pain.  Difficulty dressing/grooming: Denies Difficulty climbing stairs: Reports Difficulty getting out of chair: Denies Difficulty using hands for taps, buttons, cutlery, and/or writing: Denies   Review of Systems  Constitutional: Negative for fatigue, night sweats, weight gain, weight loss and weakness.  HENT: Negative for mouth sores, trouble swallowing, trouble swallowing, mouth dryness and nose dryness.   Eyes: Negative for pain, redness, visual disturbance and dryness.  Respiratory: Negative for cough, shortness of breath and difficulty breathing.   Cardiovascular: Negative for chest pain, palpitations, hypertension, irregular heartbeat and swelling in legs/feet.  Gastrointestinal: Negative for blood in stool, constipation and diarrhea.  Endocrine: Negative for increased urination.  Genitourinary: Negative for vaginal dryness.  Musculoskeletal: Positive for arthralgias and joint pain. Negative for joint swelling, myalgias, muscle weakness, morning stiffness, muscle tenderness and myalgias.  Skin: Negative for color  change, rash, hair loss, skin tightness, ulcers and sensitivity to sunlight.  Allergic/Immunologic: Negative for susceptible to infections.  Neurological: Negative for dizziness, memory loss and night sweats.  Hematological: Negative for swollen glands.  Psychiatric/Behavioral: Negative for depressed mood and sleep disturbance. The patient is not nervous/anxious.     PMFS History:  Patient Active Problem List   Diagnosis Date Noted  . Primary osteoarthritis of both hands 01/28/2017  . Primary osteoarthritis of both hips 01/28/2017  . Flat foot 01/28/2017  . Generalized hypermobility of joints 01/28/2017  . Abdominal pain, epigastric 12/13/2013  . Routine general medical examination at a health care facility 06/10/2013  . ADHD (attention deficit hyperactivity disorder), combined type 03/09/2012  . Hip arthritis 03/09/2012  . Contact dermatitis 03/09/2012  . GERD (gastroesophageal reflux disease) 10/13/2011  . Bunion of great toe 10/13/2011  . Bruise of toe 10/13/2011    Past Medical History:  Diagnosis Date  . ADHD (attention deficit hyperactivity disorder)   . Chicken pox   . Osteoarthritis     Family History  Problem Relation Age of Onset  . Colon cancer Neg Hx   . Esophageal cancer Neg Hx   . Rectal cancer Neg Hx   . Stomach cancer Neg Hx    Past Surgical History:  Procedure Laterality Date  . ABDOMINAL HYSTERECTOMY    . BUNIONECTOMY  dec 2013   Social History   Social History Narrative  . No narrative on file     Objective: Vital Signs: BP 123/68 (BP Location: Left Arm, Patient Position: Sitting, Cuff Size: Normal)   Pulse Marland Kitchen)  56   Resp 15   Ht 5' 5.5" (1.664 m)   Wt 152 lb (68.9 kg)   BMI 24.91 kg/m    Physical Exam  Constitutional: She is oriented to person, place, and time. She appears well-developed and well-nourished.  HENT:  Head: Normocephalic and atraumatic.  Eyes: Conjunctivae and EOM are normal.  Neck: Normal range of motion.    Cardiovascular: Normal rate, regular rhythm, normal heart sounds and intact distal pulses.   Pulmonary/Chest: Effort normal and breath sounds normal.  Abdominal: Soft. Bowel sounds are normal.  Lymphadenopathy:    She has no cervical adenopathy.  Neurological: She is alert and oriented to person, place, and time.  Skin: Skin is warm and dry. Capillary refill takes less than 2 seconds.  Psychiatric: She has a normal mood and affect. Her behavior is normal.  Nursing note and vitals reviewed.    Musculoskeletal Exam: C-spine and thoracic lumbar spine good range of motion. Shoulder joints although joints wrist joints with good range of motion. She has DIP PIP thickening of bilateral hands consistent with osteoarthritis. She has limited range of motion of her right hip joint consistent with osteoarthritis. Left hip joint had good range of motion. She is some discomfort range of motion of her left knee joint without any warmth swelling or effusion. She has DIP PIP thickening in her feet and dorsal spur consistent with osteoarthritis.  CDAI Exam: No CDAI exam completed.    Investigation: Findings:  02/19/2016 right hip joint x-ray was obtained, 2 views, which showed moderate narrowing of superolateral/inferolateral space, osteophyte and some cystic changes consistent with osteoarthritis.   CBC Latest Ref Rng & Units 07/01/2016 06/29/2015 06/15/2014  WBC 4.0 - 10.5 K/uL 7.5 6.6 5.4  Hemoglobin 12.0 - 15.0 g/dL 13.6 13.5 13.4  Hematocrit 36.0 - 46.0 % 40.5 40.7 40.9  Platelets 150.0 - 400.0 K/uL 315.0 303.0 285.0   CMP Latest Ref Rng & Units 07/01/2016 06/29/2015 06/15/2014  Glucose 70 - 99 mg/dL 89 86 87  BUN 6 - 23 mg/dL 19 18 12   Creatinine 0.40 - 1.20 mg/dL 0.83 0.77 0.8  Sodium 135 - 145 mEq/L 139 139 137  Potassium 3.5 - 5.1 mEq/L 4.2 3.8 3.8  Chloride 96 - 112 mEq/L 103 104 103  CO2 19 - 32 mEq/L 28 29 27   Calcium 8.4 - 10.5 mg/dL 9.4 9.2 9.0  Total Protein 6.0 - 8.3 g/dL 6.7 6.8 6.6   Total Bilirubin 0.2 - 1.2 mg/dL 0.6 0.7 0.6  Alkaline Phos 39 - 117 U/L 52 50 49  AST 0 - 37 U/L 23 24 28   ALT 0 - 35 U/L 23 21 29     Imaging: No results found.  Speciality Comments: No specialty comments available.    Procedures:  No procedures performed Allergies: Patient has no known allergies.   Assessment / Plan:     Visit Diagnoses: Primary osteoarthritis of both hands: Patient continues to assist the stiffness and pain in her hands.  Primary osteoarthritis of both hips - Right moderate, left mild. She has limited range of motion she plans to have right total hip replacement next year. She continues to have normal discomfort in her right hip and would like to get a cortisone injection. I will refer her to Dr. Ernestina Patches for injection. Hip joints strength exercises were given is felt. Need for regular exercise and maintaining weight was discussed.  Chronic pain of left knee: The left knee pain appears to be secondary to favoring her right  hip. Have given her some knee joint strengthening exercises.  Bunion of great toe: Proper fitting shoes were discussed.  Pes planus of both feet: arch support was discussed.  Medication management - she is on Mobic 7.5 mg by mouth daily. I will check her labs today. Side effects of long-term anti-inflammatory use was discussed. Plan: CBC with Differential/Platelet, COMPLETE METABOLIC PANEL WITH GFR  Generalized hypermobility of joints    Orders: Orders Placed This Encounter  Procedures  . CBC with Differential/Platelet  . COMPLETE METABOLIC PANEL WITH GFR  . Ambulatory referral to Physical Medicine Rehab   No orders of the defined types were placed in this encounter.   Face-to-face time spent with patient was 30 minutes. Greater than 50% of time was spent in counseling and coordination of care.  Follow-Up Instructions: Return in about 6 months (around 08/06/2017) for Osteoarthritis.   Bo Merino, MD  Note - This record has  been created using Editor, commissioning.  Chart creation errors have been sought, but may not always  have been located. Such creation errors do not reflect on  the standard of medical care.

## 2017-02-03 ENCOUNTER — Encounter: Payer: Self-pay | Admitting: Rheumatology

## 2017-02-03 ENCOUNTER — Encounter (INDEPENDENT_AMBULATORY_CARE_PROVIDER_SITE_OTHER): Payer: Self-pay

## 2017-02-03 ENCOUNTER — Ambulatory Visit (INDEPENDENT_AMBULATORY_CARE_PROVIDER_SITE_OTHER): Payer: 59 | Admitting: Rheumatology

## 2017-02-03 VITALS — BP 123/68 | HR 56 | Resp 15 | Ht 65.5 in | Wt 152.0 lb

## 2017-02-03 DIAGNOSIS — M19041 Primary osteoarthritis, right hand: Secondary | ICD-10-CM

## 2017-02-03 DIAGNOSIS — M2142 Flat foot [pes planus] (acquired), left foot: Secondary | ICD-10-CM

## 2017-02-03 DIAGNOSIS — M2141 Flat foot [pes planus] (acquired), right foot: Secondary | ICD-10-CM

## 2017-02-03 DIAGNOSIS — M160 Bilateral primary osteoarthritis of hip: Secondary | ICD-10-CM | POA: Diagnosis not present

## 2017-02-03 DIAGNOSIS — Z79899 Other long term (current) drug therapy: Secondary | ICD-10-CM | POA: Diagnosis not present

## 2017-02-03 DIAGNOSIS — M19042 Primary osteoarthritis, left hand: Secondary | ICD-10-CM

## 2017-02-03 DIAGNOSIS — M21619 Bunion of unspecified foot: Secondary | ICD-10-CM

## 2017-02-03 DIAGNOSIS — G8929 Other chronic pain: Secondary | ICD-10-CM | POA: Diagnosis not present

## 2017-02-03 DIAGNOSIS — M25562 Pain in left knee: Secondary | ICD-10-CM | POA: Diagnosis not present

## 2017-02-03 DIAGNOSIS — M2480 Other specific joint derangements of unspecified joint, not elsewhere classified: Secondary | ICD-10-CM

## 2017-02-03 LAB — CBC WITH DIFFERENTIAL/PLATELET
BASOS PCT: 1 %
Basophils Absolute: 77 cells/uL (ref 0–200)
EOS PCT: 3 %
Eosinophils Absolute: 231 cells/uL (ref 15–500)
HCT: 40.3 % (ref 35.0–45.0)
HEMOGLOBIN: 13.3 g/dL (ref 11.7–15.5)
LYMPHS ABS: 1617 {cells}/uL (ref 850–3900)
LYMPHS PCT: 21 %
MCH: 31.1 pg (ref 27.0–33.0)
MCHC: 33 g/dL (ref 32.0–36.0)
MCV: 94.4 fL (ref 80.0–100.0)
MONO ABS: 693 {cells}/uL (ref 200–950)
MPV: 9.3 fL (ref 7.5–12.5)
Monocytes Relative: 9 %
Neutro Abs: 5082 cells/uL (ref 1500–7800)
Neutrophils Relative %: 66 %
Platelets: 314 10*3/uL (ref 140–400)
RBC: 4.27 MIL/uL (ref 3.80–5.10)
RDW: 12.6 % (ref 11.0–15.0)
WBC: 7.7 10*3/uL (ref 3.8–10.8)

## 2017-02-03 LAB — COMPLETE METABOLIC PANEL WITH GFR
ALT: 24 U/L (ref 6–29)
AST: 24 U/L (ref 10–35)
Albumin: 4.2 g/dL (ref 3.6–5.1)
Alkaline Phosphatase: 54 U/L (ref 33–130)
BILIRUBIN TOTAL: 0.5 mg/dL (ref 0.2–1.2)
BUN: 14 mg/dL (ref 7–25)
CALCIUM: 9.3 mg/dL (ref 8.6–10.4)
CO2: 26 mmol/L (ref 20–32)
Chloride: 103 mmol/L (ref 98–110)
Creat: 0.8 mg/dL (ref 0.50–0.99)
GFR, Est African American: >89 mL/min (ref >=60)
GFR, Est Non African American: 79 mL/min (ref >=60)
GLUCOSE: 88 mg/dL (ref 65–99)
Potassium: 4 mmol/L (ref 3.5–5.3)
SODIUM: 139 mmol/L (ref 135–146)
Total Protein: 6.6 g/dL (ref 6.1–8.1)

## 2017-02-03 NOTE — Patient Instructions (Signed)
Knee Exercises Ask your health care provider which exercises are safe for you. Do exercises exactly as told by your health care provider and adjust them as directed. It is normal to feel mild stretching, pulling, tightness, or discomfort as you do these exercises, but you should stop right away if you feel sudden pain or your pain gets worse.Do not begin these exercises until told by your health care provider. STRETCHING AND RANGE OF MOTION EXERCISES These exercises warm up your muscles and joints and improve the movement and flexibility of your knee. These exercises also help to relieve pain, numbness, and tingling. Exercise A: Knee Extension, Prone 1. Lie on your abdomen on a bed. 2. Place your left / right knee just beyond the edge of the surface so your knee is not on the bed. You can put a towel under your left / right thigh just above your knee for comfort. 3. Relax your leg muscles and allow gravity to straighten your knee. You should feel a stretch behind your left / right knee. 4. Hold this position for __________ seconds. 5. Scoot up so your knee is supported between repetitions. Repeat __________ times. Complete this stretch __________ times a day. Exercise B: Knee Flexion, Active  1. Lie on your back with both knees straight. If this causes back discomfort, bend your left / right knee so your foot is flat on the floor. 2. Slowly slide your left / right heel back toward your buttocks until you feel a gentle stretch in the front of your knee or thigh. 3. Hold this position for __________ seconds. 4. Slowly slide your left / right heel back to the starting position. Repeat __________ times. Complete this exercise __________ times a day. Exercise C: Quadriceps, Prone  1. Lie on your abdomen on a firm surface, such as a bed or padded floor. 2. Bend your left / right knee and hold your ankle. If you cannot reach your ankle or pant leg, loop a belt around your foot and grab the belt  instead. 3. Gently pull your heel toward your buttocks. Your knee should not slide out to the side. You should feel a stretch in the front of your thigh and knee. 4. Hold this position for __________ seconds. Repeat __________ times. Complete this stretch __________ times a day. Exercise D: Hamstring, Supine 1. Lie on your back. 2. Loop a belt or towel over the ball of your left / right foot. The ball of your foot is on the walking surface, right under your toes. 3. Straighten your left / right knee and slowly pull on the belt to raise your leg until you feel a gentle stretch behind your knee. ? Do not let your left / right knee bend while you do this. ? Keep your other leg flat on the floor. 4. Hold this position for __________ seconds. Repeat __________ times. Complete this stretch __________ times a day. STRENGTHENING EXERCISES These exercises build strength and endurance in your knee. Endurance is the ability to use your muscles for a long time, even after they get tired. Exercise E: Quadriceps, Isometric  1. Lie on your back with your left / right leg extended and your other knee bent. Put a rolled towel or small pillow under your knee if told by your health care provider. 2. Slowly tense the muscles in the front of your left / right thigh. You should see your kneecap slide up toward your hip or see increased dimpling just above the knee. This   motion will push the back of the knee toward the floor. 3. For __________ seconds, keep the muscle as tight as you can without increasing your pain. 4. Relax the muscles slowly and completely. Repeat __________ times. Complete this exercise __________ times a day. Exercise F: Straight Leg Raises - Quadriceps 1. Lie on your back with your left / right leg extended and your other knee bent. 2. Tense the muscles in the front of your left / right thigh. You should see your kneecap slide up or see increased dimpling just above the knee. Your thigh may  even shake a bit. 3. Keep these muscles tight as you raise your leg 4-6 inches (10-15 cm) off the floor. Do not let your knee bend. 4. Hold this position for __________ seconds. 5. Keep these muscles tense as you lower your leg. 6. Relax your muscles slowly and completely after each repetition. Repeat __________ times. Complete this exercise __________ times a day. Exercise G: Hamstring, Isometric 1. Lie on your back on a firm surface. 2. Bend your left / right knee approximately __________ degrees. 3. Dig your left / right heel into the surface as if you are trying to pull it toward your buttocks. Tighten the muscles in the back of your thighs to dig as hard as you can without increasing any pain. 4. Hold this position for __________ seconds. 5. Release the tension gradually and allow your muscles to relax completely for __________ seconds after each repetition. Repeat __________ times. Complete this exercise __________ times a day. Exercise H: Hamstring Curls  If told by your health care provider, do this exercise while wearing ankle weights. Begin with __________ weights. Then increase the weight by 1 lb (0.5 kg) increments. Do not wear ankle weights that are more than __________. 1. Lie on your abdomen with your legs straight. 2. Bend your left / right knee as far as you can without feeling pain. Keep your hips flat against the floor. 3. Hold this position for __________ seconds. 4. Slowly lower your leg to the starting position.  Repeat __________ times. Complete this exercise __________ times a day. Exercise I: Squats (Quadriceps) 1. Stand in front of a table, with your feet and knees pointing straight ahead. You may rest your hands on the table for balance but not for support. 2. Slowly bend your knees and lower your hips like you are going to sit in a chair. ? Keep your weight over your heels, not over your toes. ? Keep your lower legs upright so they are parallel with the table  legs. ? Do not let your hips go lower than your knees. ? Do not bend lower than told by your health care provider. ? If your knee pain increases, do not bend as low. 3. Hold the squat position for __________ seconds. 4. Slowly push with your legs to return to standing. Do not use your hands to pull yourself to standing. Repeat __________ times. Complete this exercise __________ times a day. Exercise J: Wall Slides (Quadriceps)  1. Lean your back against a smooth wall or door while you walk your feet out 18-24 inches (46-61 cm) from it. 2. Place your feet hip-width apart. 3. Slowly slide down the wall or door until your knees bend __________ degrees. Keep your knees over your heels, not over your toes. Keep your knees in line with your hips. 4. Hold for __________ seconds. Repeat __________ times. Complete this exercise __________ times a day. Exercise K: Straight Leg Raises -   Hip Abductors 1. Lie on your side with your left / right leg in the top position. Lie so your head, shoulder, knee, and hip line up. You may bend your bottom knee to help you keep your balance. 2. Roll your hips slightly forward so your hips are stacked directly over each other and your left / right knee is facing forward. 3. Leading with your heel, lift your top leg 4-6 inches (10-15 cm). You should feel the muscles in your outer hip lifting. ? Do not let your foot drift forward. ? Do not let your knee roll toward the ceiling. 4. Hold this position for __________ seconds. 5. Slowly return your leg to the starting position. 6. Let your muscles relax completely after each repetition. Repeat __________ times. Complete this exercise __________ times a day. Exercise L: Straight Leg Raises - Hip Extensors 1. Lie on your abdomen on a firm surface. You can put a pillow under your hips if that is more comfortable. 2. Tense the muscles in your buttocks and lift your left / right leg about 4-6 inches (10-15 cm). Keep your knee  straight as you lift your leg. 3. Hold this position for __________ seconds. 4. Slowly lower your leg to the starting position. 5. Let your leg relax completely after each repetition. Repeat __________ times. Complete this exercise __________ times a day. This information is not intended to replace advice given to you by your health care provider. Make sure you discuss any questions you have with your health care provider. Document Released: 04/24/2005 Document Revised: 03/04/2016 Document Reviewed: 04/16/2015 [employer] Interactive Patient Education  2018 [employer] Hip Exercises Ask your health care provider which exercises are safe for you. Do exercises exactly as told by your health care provider and adjust them as directed. It is normal to feel mild stretching, pulling, tightness, or discomfort as you do these exercises, but you should stop right away if you feel sudden pain or your pain gets worse.Do not begin these exercises until told by your health care provider. STRETCHING AND RANGE OF MOTION EXERCISES These exercises warm up your muscles and joints and improve the movement and flexibility of your hip. These exercises also help to relieve pain, numbness, and tingling. Exercise A: Hamstrings, Supine  1. Lie on your back. 2. Loop a belt or towel over the ball of your left / rightfoot. The ball of your foot is on the walking surface, right under your toes. 3. Straighten your left / rightknee and slowly pull on the belt to raise your leg. ? Do not let your left / right knee bend while you do this. ? Keep your other leg flat on the floor. ? Raise the left / right leg until you feel a gentle stretch behind your left / right knee or thigh. 4. Hold this position for __________ seconds. 5. Slowly return your leg to the starting position. Repeat __________ times. Complete this stretch __________ times a day. Exercise B: Hip Rotators  1. Lie on your back on a firm surface. 2. Hold your  left / right knee with your left / right hand. Hold your ankle with your other hand. 3. Gently pull your left / right knee and rotate your lower leg toward your other shoulder. ? Pull until you feel a stretch in your buttocks. ? Keep your hips and shoulders firmly planted while you do this stretch. 4. Hold this position for __________ seconds. Repeat __________ times. Complete this stretch __________ times a day. Exercise   C: V-Sit (Hamstrings and Adductors)  1. Sit on the floor with your legs extended in a large "V" shape. Keep your knees straight during this exercise. 2. Start with your head and chest upright, then bend at your waist to reach for your left foot (position A). You should feel a stretch in your right inner thigh. 3. Hold this position for __________ seconds. Then slowly return to the upright position. 4. Bend at your waist to reach forward (position B). You should feel a stretch behind both of your thighs and knees. 5. Hold this position for __________ seconds. Then slowly return to the upright position. 6. Bend at your waist to reach for your right foot (position C). You should feel a stretch in your left inner thigh. 7. Hold this position for __________ seconds. Then slowly return to the upright position. Repeat __________ times. Complete this stretch __________ times a day. Exercise D: Lunge (Hip Flexors)  1. Place your left / right knee on the floor and bend your other knee so that is directly over your ankle. You should be half-kneeling. 2. Keep good posture with your head over your shoulders. 3. Tighten your buttocks to point your tailbone downward. This helps your back to keep from arching too much. 4. You should feel a gentle stretch in the front of your left / right thigh and hip. If you do not feel any resistance, slightly slide your other foot forward and then slowly lunge forward so your knee once again lines up over your ankle. 5. Make sure your tailbone continues to  point downward. 6. Hold this position for __________ seconds. Repeat __________ times. Complete this stretch __________ times a day. STRENGTHENING EXERCISES These exercises build strength and endurance in your hip. Endurance is the ability to use your muscles for a long time, even after they get tired. Exercise E: Bridge (Hip Extensors)  1. Lie on your back on a firm surface with your knees bent and your feet flat on the floor. 2. Tighten your buttocks muscles and lift your bottom off the floor until the trunk of your body is level with your thighs. ? Do not arch your back. ? You should feel the muscles working in your buttocks and the back of your thighs. If you do not feel these muscles, slide your feet 1-2 inches (2.5-5 cm) farther away from your buttocks. 3. Hold this position for __________ seconds. 4. Slowly lower your hips to the starting position. 5. Let your muscles relax completely between repetitions. 6. If this exercise is too easy, try doing it with your arms crossed over your chest. Repeat __________ times. Complete this exercise __________ times a day. Exercise F: Straight Leg Raises - Hip Abductors  1. Lie on your side with your left / right leg in the top position. Lie so your head, shoulder, knee, and hip line up with each other. You may bend your bottom knee to help you balance. 2. Roll your hips slightly forward, so your hips are stacked directly over each other and your left / right knee is facing forward. 3. Leading with your heel, lift your top leg 4-6 inches (10-15 cm). You should feel the muscles in your outer hip lifting. ? Do not let your foot drift forward. ? Do not let your knee roll toward the ceiling. 4. Hold this position for __________ seconds. 5. Slowly return to the starting position. 6. Let your muscles relax completely between repetitions. Repeat __________ times. Complete this exercise __________ times   a day. Exercise G: Straight Leg Raises - Hip  Adductors  1. Lie on your side with your left / right leg in the bottom position. Lie so your head, shoulder, knee, and hip line up. You may place your upper foot in front to help you balance. 2. Roll your hips slightly forward, so your hips are stacked directly over each other and your left / right knee is facing forward. 3. Tense the muscles in your inner thigh and lift your bottom leg 4-6 inches (10-15 cm). 4. Hold this position for __________ seconds. 5. Slowly return to the starting position. 6. Let your muscles relax completely between repetitions. Repeat __________ times. Complete this exercise __________ times a day. Exercise H: Straight Leg Raises - Quadriceps  1. Lie on your back with your left / right leg extended and your other knee bent. 2. Tense the muscles in the front of your left / right thigh. When you do this, you should see your kneecap slide up or see increased dimpling just above your knee. 3. Tighten these muscles even more and raise your leg 4-6 inches (10-15 cm) off the floor. 4. Hold this position for __________ seconds. 5. Keep these muscles tense as you lower your leg. 6. Relax the muscles slowly and completely between repetitions. Repeat __________ times. Complete this exercise __________ times a day. Exercise I: Hip Abductors, Standing 1. Tie one end of a rubber exercise band or tubing to a secure surface, such as a table or pole. 2. Loop the other end of the band or tubing around your left / right ankle. 3. Keeping your ankle with the band or tubing directly opposite of the secured end, step away until there is tension in the tubing or band. Hold onto a chair as needed for balance. 4. Lift your left / right leg out to your side. While you do this: ? Keep your back upright. ? Keep your shoulders over your hips. ? Keep your toes pointing forward. ? Make sure to use your hip muscles to lift your leg. Do not "throw" your leg or tip your body to lift your  leg. 5. Hold this position for __________ seconds. 6. Slowly return to the starting position. Repeat __________ times. Complete this exercise __________ times a day. Exercise J: Squats (Quadriceps) 1. Stand in a door frame so your feet and knees are in line with the frame. You may place your hands on the frame for balance. 2. Slowly bend your knees and lower your hips like you are going to sit in a chair. ? Keep your lower legs in a straight-up-and-down position. ? Do not let your hips go lower than your knees. ? Do not bend your knees lower than told by your health care provider. ? If your hip pain increases, do not bend as low. 3. Hold this position for ___________ seconds. 4. Slowly push with your legs to return to standing. Do not use your hands to pull yourself to standing. Repeat __________ times. Complete this exercise __________ times a day. This information is not intended to replace advice given to you by your health care provider. Make sure you discuss any questions you have with your health care provider. Document Released: 06/28/2005 Document Revised: 03/04/2016 Document Reviewed: 06/05/2015 Elsevier Interactive Patient Education  2018 [employer]  

## 2017-02-04 ENCOUNTER — Telehealth: Payer: Self-pay | Admitting: Radiology

## 2017-02-04 NOTE — Telephone Encounter (Signed)
-----   Message from Bo Merino, MD sent at 02/04/2017 10:39 AM EDT ----- Within normal limits

## 2017-02-04 NOTE — Progress Notes (Signed)
Within normal limits

## 2017-02-04 NOTE — Telephone Encounter (Signed)
I have called patient to advise labs are normal  

## 2017-02-14 ENCOUNTER — Encounter (INDEPENDENT_AMBULATORY_CARE_PROVIDER_SITE_OTHER): Payer: Self-pay | Admitting: Physical Medicine and Rehabilitation

## 2017-02-14 ENCOUNTER — Ambulatory Visit (INDEPENDENT_AMBULATORY_CARE_PROVIDER_SITE_OTHER): Payer: 59 | Admitting: Physical Medicine and Rehabilitation

## 2017-02-14 ENCOUNTER — Ambulatory Visit (INDEPENDENT_AMBULATORY_CARE_PROVIDER_SITE_OTHER): Payer: 59

## 2017-02-14 DIAGNOSIS — M25551 Pain in right hip: Secondary | ICD-10-CM

## 2017-02-14 NOTE — Progress Notes (Signed)
Gloria Frazier - 61 y.o. female MRN [id number]  Date of birth: [birth year]  Office Visit Note: Visit Date: 02/14/2017 PCP: Midge Minium, MD Referred by: Midge Minium, MD  Subjective: Chief Complaint  Patient presents with  . Right Hip - Pain   HPI: Gloria Frazier is a 61 year old female with several years of right hip pain which has worsened over the last few months. She is followed by Dr. Patrecia Pour. She does have groin pain at times with occasional catching. She denies any radiating or referring pain down the leg or paresthesias. No left-sided complaints.    ROS Otherwise per HPI.  Assessment & Plan: Visit Diagnoses:  1. Pain in right hip     Plan: Findings:  Diagnostic and therapeutic right anesthetic hip arthrogram. Patient did seem to have relief during the anesthetic phase.    Meds & Orders: No orders of the defined types were placed in this encounter.  No orders of the defined types were placed in this encounter.   Follow-up: No Follow-up on file.   Procedures: Anesthetic hip arthrogram Date/Time: 02/14/2017 9:29 AM Performed by: Magnus Sinning Authorized by: Magnus Sinning   Consent Given by:  Patient Site marked: the procedure site was marked   Timeout: prior to procedure the correct patient, procedure, and site was verified   Indications:  Pain and diagnostic evaluation Location:  Hip Site:  R hip joint Prep: patient was prepped and draped in usual sterile fashion   Needle Size:  22 G Approach:  Anterior Ultrasound Guidance: No   Fluoroscopic Guidance: No   Arthrogram: Yes   Medications:  3 mL bupivacaine 0.5 %; 80 mg triamcinolone acetonide 40 MG/ML Aspiration Attempted: Yes   Patient tolerance:  Patient tolerated the procedure well with no immediate complications  Arthrogram demonstrated excellent flow of contrast throughout the joint surface without extravasation or obvious defect.  The patient had relief of symptoms during the anesthetic  phase of the injection.      No notes on file   Clinical History: No specialty comments available.  She reports that she has never smoked. She has never used smokeless tobacco. No results for input(s): HGBA1C, LABURIC in the last 8760 hours.  Objective:  VS:  HT:    WT:   BMI:     BP:   HR: bpm  TEMP: ( )  RESP:  Physical Exam  Musculoskeletal:  Patient has stiff range of motion of the right hip with pain at end ranges of rotation particularly internal rotation. She has good distal strength.    Ortho Exam Imaging: No results found.  Past Medical/Family/Surgical/Social History: Medications & Allergies reviewed per EMR Patient Active Problem List   Diagnosis Date Noted  . Primary osteoarthritis of both hands 01/28/2017  . Primary osteoarthritis of both hips 01/28/2017  . Flat foot 01/28/2017  . Generalized hypermobility of joints 01/28/2017  . Abdominal pain, epigastric 12/13/2013  . Routine general medical examination at a health care facility 06/10/2013  . ADHD (attention deficit hyperactivity disorder), combined type 03/09/2012  . Hip arthritis 03/09/2012  . Contact dermatitis 03/09/2012  . GERD (gastroesophageal reflux disease) 10/13/2011  . Bunion of great toe 10/13/2011  . Bruise of toe 10/13/2011   Past Medical History:  Diagnosis Date  . ADHD (attention deficit hyperactivity disorder)   . Chicken pox   . Osteoarthritis    Family History  Problem Relation Age of Onset  . Colon cancer Neg Hx   . Esophageal cancer Neg  Hx   . Rectal cancer Neg Hx   . Stomach cancer Neg Hx    Past Surgical History:  Procedure Laterality Date  . ABDOMINAL HYSTERECTOMY    . BUNIONECTOMY  dec 2013   Social History   Occupational History  . Not on file.   Social History Main Topics  . Smoking status: Never Smoker  . Smokeless tobacco: Never Used  . Alcohol use Yes     Comment: occasionally  . Drug use: No  . Sexual activity: Not on file

## 2017-02-14 NOTE — Patient Instructions (Signed)

## 2017-02-14 NOTE — Progress Notes (Deleted)
Right hip pain for several years. Worse the last few months. Groin pain at times.Occosional catching. Denies radiating pain down leg.

## 2017-02-17 MED ORDER — BUPIVACAINE HCL 0.5 % IJ SOLN
3.0000 mL | INTRAMUSCULAR | Status: AC | PRN
  Administered 2017-02-14: 3 mL via INTRA_ARTICULAR

## 2017-02-17 MED ORDER — TRIAMCINOLONE ACETONIDE 40 MG/ML IJ SUSP
80.0000 mg | INTRAMUSCULAR | Status: AC | PRN
  Administered 2017-02-14: 80 mg via INTRA_ARTICULAR

## 2017-02-18 DIAGNOSIS — N6320 Unspecified lump in the left breast, unspecified quadrant: Secondary | ICD-10-CM | POA: Diagnosis not present

## 2017-02-26 ENCOUNTER — Other Ambulatory Visit: Payer: Self-pay | Admitting: Rheumatology

## 2017-02-26 NOTE — Telephone Encounter (Signed)
Last Visit: 02/03/17 Next Visit: 08/06/16 Labs: 02/03/17 WNL  Okay to refill per Dr. Estanislado Pandy

## 2017-03-24 ENCOUNTER — Other Ambulatory Visit: Payer: Self-pay | Admitting: Rheumatology

## 2017-03-24 NOTE — Telephone Encounter (Signed)
Last Visit: 02/03/17 Next Visit: 08/06/17 Labs: 02/03/17 WNL  Okay to refill per Dr. Estanislado Pandy

## 2017-04-14 ENCOUNTER — Other Ambulatory Visit: Payer: Self-pay | Admitting: Family Medicine

## 2017-04-14 ENCOUNTER — Other Ambulatory Visit: Payer: Self-pay | Admitting: Obstetrics & Gynecology

## 2017-04-14 DIAGNOSIS — N6320 Unspecified lump in the left breast, unspecified quadrant: Secondary | ICD-10-CM

## 2017-04-14 MED ORDER — METHYLPHENIDATE HCL 10 MG PO TABS: ORAL_TABLET | ORAL | 0 refills | Status: DC

## 2017-04-14 MED ORDER — METHYLPHENIDATE HCL ER (OSM) 18 MG PO TBCR
18.0000 mg | EXTENDED_RELEASE_TABLET | Freq: Every day | ORAL | 0 refills | Status: DC
Start: 2017-04-14 — End: 2017-06-06

## 2017-04-14 NOTE — Telephone Encounter (Signed)
Pt made aware rx available at front desk for pick up.

## 2017-04-14 NOTE — Telephone Encounter (Signed)
Last OV 12/30/16 Ritalin 10mg  12/30/16 #30 with 0 Ritalin 18mg  12/30/16 #30 with 0

## 2017-04-14 NOTE — Telephone Encounter (Signed)
Pt needs refill on methylphenidate Ritalin and concerta, Pt states that she will pick these up on Thursday when she comes in for a flu shot.

## 2017-04-17 ENCOUNTER — Ambulatory Visit (INDEPENDENT_AMBULATORY_CARE_PROVIDER_SITE_OTHER): Payer: 59

## 2017-04-17 DIAGNOSIS — Z23 Encounter for immunization: Secondary | ICD-10-CM

## 2017-04-17 DIAGNOSIS — N649 Disorder of breast, unspecified: Secondary | ICD-10-CM | POA: Diagnosis not present

## 2017-04-18 ENCOUNTER — Other Ambulatory Visit: Payer: Self-pay | Admitting: Obstetrics & Gynecology

## 2017-04-18 DIAGNOSIS — N6320 Unspecified lump in the left breast, unspecified quadrant: Secondary | ICD-10-CM

## 2017-04-18 DIAGNOSIS — N63 Unspecified lump in unspecified breast: Secondary | ICD-10-CM

## 2017-04-20 ENCOUNTER — Other Ambulatory Visit: Payer: Self-pay | Admitting: Rheumatology

## 2017-04-21 NOTE — Telephone Encounter (Signed)
Last Visit: 02/03/17 Next Visit: 08/06/17 Labs: 02/03/17 WNL  Okay to refill per Dr. Estanislado Pandy

## 2017-04-24 ENCOUNTER — Ambulatory Visit
Admission: RE | Admit: 2017-04-24 | Discharge: 2017-04-24 | Disposition: A | Discharge status: Home / Self Care | Payer: 59 | Source: Ambulatory Visit | Attending: Obstetrics & Gynecology | Admitting: Obstetrics & Gynecology

## 2017-04-24 DIAGNOSIS — R928 Other abnormal and inconclusive findings on diagnostic imaging of breast: Secondary | ICD-10-CM | POA: Diagnosis not present

## 2017-04-24 DIAGNOSIS — N63 Unspecified lump in unspecified breast: Secondary | ICD-10-CM

## 2017-04-24 DIAGNOSIS — N6489 Other specified disorders of breast: Secondary | ICD-10-CM | POA: Diagnosis not present

## 2017-04-24 DIAGNOSIS — N6320 Unspecified lump in the left breast, unspecified quadrant: Secondary | ICD-10-CM

## 2017-05-17 ENCOUNTER — Other Ambulatory Visit: Payer: Self-pay | Admitting: Rheumatology

## 2017-05-19 NOTE — Telephone Encounter (Signed)
Last Visit: 02/03/17 Next Visit: 08/06/17 Labs: 02/03/17 WNL  Okay to refill per Dr. Estanislado Pandy

## 2017-05-23 DIAGNOSIS — L821 Other seborrheic keratosis: Secondary | ICD-10-CM | POA: Diagnosis not present

## 2017-05-23 DIAGNOSIS — D2239 Melanocytic nevi of other parts of face: Secondary | ICD-10-CM | POA: Diagnosis not present

## 2017-05-23 DIAGNOSIS — L819 Disorder of pigmentation, unspecified: Secondary | ICD-10-CM | POA: Diagnosis not present

## 2017-06-03 ENCOUNTER — Ambulatory Visit: Payer: 59 | Admitting: Internal Medicine

## 2017-06-06 ENCOUNTER — Other Ambulatory Visit: Payer: Self-pay

## 2017-06-06 ENCOUNTER — Ambulatory Visit: Payer: 59 | Admitting: Family Medicine

## 2017-06-06 ENCOUNTER — Ambulatory Visit (INDEPENDENT_AMBULATORY_CARE_PROVIDER_SITE_OTHER): Payer: 59

## 2017-06-06 ENCOUNTER — Encounter: Payer: Self-pay | Admitting: Family Medicine

## 2017-06-06 VITALS — BP 122/80 | HR 71 | Temp 99.2°F | Resp 16 | Ht 66.0 in | Wt 148.2 lb

## 2017-06-06 DIAGNOSIS — F902 Attention-deficit hyperactivity disorder, combined type: Secondary | ICD-10-CM | POA: Diagnosis not present

## 2017-06-06 DIAGNOSIS — M25551 Pain in right hip: Secondary | ICD-10-CM | POA: Diagnosis not present

## 2017-06-06 DIAGNOSIS — M1610 Unilateral primary osteoarthritis, unspecified hip: Secondary | ICD-10-CM

## 2017-06-06 DIAGNOSIS — R350 Frequency of micturition: Secondary | ICD-10-CM

## 2017-06-06 DIAGNOSIS — S79911A Unspecified injury of right hip, initial encounter: Secondary | ICD-10-CM | POA: Diagnosis not present

## 2017-06-06 LAB — POCT URINALYSIS DIPSTICK
Bilirubin, UA: NEGATIVE
GLUCOSE UA: NEGATIVE
KETONES UA: NEGATIVE
Leukocytes, UA: NEGATIVE
NITRITE UA: NEGATIVE
PROTEIN UA: NEGATIVE
RBC UA: NEGATIVE
SPEC GRAV UA: 1.01 (ref 1.010–1.025)
Urobilinogen, UA: 0.2 E.U./dL
pH, UA: 6 (ref 5.0–8.0)

## 2017-06-06 MED ORDER — METHYLPHENIDATE HCL ER (OSM) 18 MG PO TBCR: 18.0000 mg | EXTENDED_RELEASE_TABLET | Freq: Every day | ORAL | 0 refills | Status: DC

## 2017-06-06 MED ORDER — METHYLPHENIDATE HCL 10 MG PO TABS: ORAL_TABLET | ORAL | 0 refills | Status: DC

## 2017-06-06 MED ORDER — TRAMADOL HCL 50 MG PO TABS: 50.0000 mg | ORAL_TABLET | Freq: Three times a day (TID) | ORAL | 0 refills | Status: DC | PRN

## 2017-06-06 NOTE — Assessment & Plan Note (Signed)
Deteriorated.  Pt is worried about possible 'hairline fracture' after her fall in November.  On Meloxicam daily.  Get xray to assess.  Add Tramadol for severe pain.  Refer to Ortho for complete evaluation and discussion of tx options.  Reviewed supportive care and red flags that should prompt return.  Pt expressed understanding and is in agreement w/ plan.

## 2017-06-06 NOTE — Assessment & Plan Note (Signed)
Chronic problem.  Doing well on current meds.  Due for a refill.  Prescriptions sent.

## 2017-06-06 NOTE — Patient Instructions (Signed)
Follow up as scheduled Go to the [facility] (across from [employer]) and get your xray Use the Tramadol as needed for severe hip pain We'll call you with your Ortho appt Drink plenty of fluids- no evidence of UTI You can add Tylenol (Acetaminophen) as needed for fever while taking the Meloxicam Call with any questions or concerns Hang in there! Merry Christmas!

## 2017-06-06 NOTE — Progress Notes (Signed)
   Subjective:    Patient ID: Gloria Frazier, female    DOB: Mar 13, 1956, 61 y.o.   MRN: [id number]  HPI Urinary frequency- Saturday had chills and low grade temp.  Tm 101 overnight.  Had urge to urinate but sensation of not being able to empty completely- this resolved.  No dysuria, hematuria.  Pt reports urination is now back to normal.  R hip pain- seeing Dr Estanislado Pandy, now on Mobic.  Had hip injxn in August w/ temporary relief.  Fell in November, long drive for Thanksgiving- pain is now much worse.  'when it's bad, it's bad'.  Pt having a hard time getting up and going.  ADHD- pt needs refills on medications   Review of Systems For ROS see HPI     Objective:   Physical Exam  Constitutional: She is oriented to person, place, and time. She appears well-developed and well-nourished.  Obviously uncomfortable  HENT:  Head: Normocephalic and atraumatic.  Abdominal: Soft. Bowel sounds are normal. She exhibits no distension. There is no tenderness. There is no rebound.  Musculoskeletal:  Pt unable to sit comfortably or ambulate w/o difficulty due to R hip pain  Neurological: She is alert and oriented to person, place, and time.  Skin: Skin is warm and dry.  Psychiatric: She has a normal mood and affect. Her behavior is normal. Thought content normal.  Vitals reviewed.         Assessment & Plan:  Urinary frequency- new.  No evidence of UTI on urine dip and pt reports that she is now voiding normally since her sxs this weekend.  No need for abx.  Reviewed supportive care and red flags that should prompt return.  Pt expressed understanding and is in agreement w/ plan.

## 2017-06-27 ENCOUNTER — Other Ambulatory Visit: Payer: 59

## 2017-07-02 ENCOUNTER — Encounter: Payer: Self-pay | Admitting: Family Medicine

## 2017-07-02 ENCOUNTER — Ambulatory Visit (INDEPENDENT_AMBULATORY_CARE_PROVIDER_SITE_OTHER): Payer: 59 | Admitting: Family Medicine

## 2017-07-02 ENCOUNTER — Other Ambulatory Visit: Payer: Self-pay

## 2017-07-02 ENCOUNTER — Encounter: Payer: Self-pay | Admitting: General Practice

## 2017-07-02 VITALS — BP 112/82 | HR 60 | Resp 16 | Ht 66.0 in | Wt 148.4 lb

## 2017-07-02 DIAGNOSIS — Z1211 Encounter for screening for malignant neoplasm of colon: Secondary | ICD-10-CM | POA: Diagnosis not present

## 2017-07-02 DIAGNOSIS — Z1159 Encounter for screening for other viral diseases: Secondary | ICD-10-CM

## 2017-07-02 DIAGNOSIS — Z0000 Encounter for general adult medical examination without abnormal findings: Secondary | ICD-10-CM | POA: Diagnosis not present

## 2017-07-02 DIAGNOSIS — Z9189 Other specified personal risk factors, not elsewhere classified: Secondary | ICD-10-CM

## 2017-07-02 LAB — HEPATIC FUNCTION PANEL
ALBUMIN: 4.2 g/dL (ref 3.5–5.2)
ALK PHOS: 61 U/L (ref 39–117)
ALT: 20 U/L (ref 0–35)
AST: 23 U/L (ref 0–37)
Bilirubin, Direct: 0.1 mg/dL (ref 0.0–0.3)
Total Bilirubin: 0.6 mg/dL (ref 0.2–1.2)
Total Protein: 6.6 g/dL (ref 6.0–8.3)

## 2017-07-02 LAB — CBC WITH DIFFERENTIAL/PLATELET
Basophils Absolute: 0.1 10*3/uL (ref 0.0–0.1)
Basophils Relative: 0.9 % (ref 0.0–3.0)
EOS PCT: 3.8 % (ref 0.0–5.0)
Eosinophils Absolute: 0.2 10*3/uL (ref 0.0–0.7)
HEMATOCRIT: 40.2 % (ref 36.0–46.0)
HEMOGLOBIN: 13.3 g/dL (ref 12.0–15.0)
LYMPHS PCT: 25 % (ref 12.0–46.0)
Lymphs Abs: 1.6 10*3/uL (ref 0.7–4.0)
MCHC: 33.1 g/dL (ref 30.0–36.0)
MCV: 92.5 fl (ref 78.0–100.0)
MONOS PCT: 7.9 % (ref 3.0–12.0)
Monocytes Absolute: 0.5 10*3/uL (ref 0.1–1.0)
Neutro Abs: 3.9 10*3/uL (ref 1.4–7.7)
Neutrophils Relative %: 62.4 % (ref 43.0–77.0)
Platelets: 312 10*3/uL (ref 150.0–400.0)
RBC: 4.35 Mil/uL (ref 3.87–5.11)
RDW: 12.3 % (ref 11.5–15.5)
WBC: 6.2 10*3/uL (ref 4.0–10.5)

## 2017-07-02 LAB — BASIC METABOLIC PANEL
BUN: 14 mg/dL (ref 6–23)
CALCIUM: 9.3 mg/dL (ref 8.4–10.5)
CO2: 30 mEq/L (ref 19–32)
Chloride: 103 mEq/L (ref 96–112)
Creatinine, Ser: 0.73 mg/dL (ref 0.40–1.20)
GFR: 85.59 mL/min (ref >60.00)
Glucose, Bld: 86 mg/dL (ref 70–99)
Potassium: 4.3 mEq/L (ref 3.5–5.1)
SODIUM: 141 meq/L (ref 135–145)

## 2017-07-02 LAB — LIPID PANEL
CHOL/HDL RATIO: 2
Cholesterol: 207 mg/dL — ABNORMAL HIGH (ref 0–200)
HDL: 86.2 mg/dL (ref >39.00)
LDL CALC: 95 mg/dL (ref 0–99)
NonHDL: 120.3
TRIGLYCERIDES: 128 mg/dL (ref 0.0–149.0)
VLDL: 25.6 mg/dL (ref 0.0–40.0)

## 2017-07-02 LAB — TSH: TSH: 1.41 u[IU]/mL (ref 0.35–4.50)

## 2017-07-02 MED ORDER — METHYLPHENIDATE HCL ER (OSM) 18 MG PO TBCR
18.0000 mg | EXTENDED_RELEASE_TABLET | Freq: Every day | ORAL | 0 refills | Status: DC
Start: 2017-07-02 — End: 2017-10-13

## 2017-07-02 NOTE — Assessment & Plan Note (Signed)
Pt's PE WNL.  UTD on GYN.  Due for colonoscopy- referral placed.  UTD on immunizations.  Check Hep C today at pt's request.  Check labs.  Anticipatory guidance provided.

## 2017-07-02 NOTE — Patient Instructions (Signed)
Follow up in 1 year or as needed We'll notify you of your lab results and make any changes if needed Continue to work on healthy diet and exercise as able- you look great! Call with any questions or concerns Happy New Year!

## 2017-07-02 NOTE — Progress Notes (Signed)
   Subjective:    Patient ID: Gloria Frazier, female    DOB: [birth year], 62 y.o.   MRN: [id number]  HPI CPE- UTD on pap, mammo.  Due for repeat colonoscopy this year.   Review of Systems Patient reports no vision/ hearing changes, adenopathy,fever, weight change,  persistant/recurrent hoarseness , swallowing issues, chest pain, palpitations, edema, persistant/recurrent cough, hemoptysis, dyspnea (rest/exertional/paroxysmal nocturnal), gastrointestinal bleeding (melena, rectal bleeding), abdominal pain, significant heartburn, bowel changes, GU symptoms (dysuria, hematuria, incontinence), Gyn symptoms (abnormal  bleeding, pain),  syncope, focal weakness, memory loss, numbness & tingling, skin/hair/nail changes, abnormal bruising or bleeding, anxiety, or depression.     Objective:   Physical Exam General Appearance:    Alert, cooperative, no distress, appears stated age  Head:    Normocephalic, without obvious abnormality, atraumatic  Eyes:    PERRL, conjunctiva/corneas clear, EOM's intact, fundi    benign, both eyes  Ears:    Normal TM's and external ear canals, both ears  Nose:   Nares normal, septum midline, mucosa normal, no drainage    or sinus tenderness  Throat:   Lips, mucosa, and tongue normal; teeth and gums normal  Neck:   Supple, symmetrical, trachea midline, no adenopathy;    Thyroid: no enlargement/tenderness/nodules  Back:     Symmetric, no curvature, ROM normal, no CVA tenderness  Lungs:     Clear to auscultation bilaterally, respirations unlabored  Chest Wall:    No tenderness or deformity   Heart:    Regular rate and rhythm, S1 and S2 normal, no murmur, rub   or gallop  Breast Exam:    Deferred to mammo  Abdomen:     Soft, non-tender, bowel sounds active all four quadrants,    no masses, no organomegaly  Genitalia:    Deferred to GYN  Rectal:    Extremities:   Extremities normal, atraumatic, no cyanosis or edema  Pulses:   2+ and symmetric all extremities  Skin:    Skin color, texture, turgor normal, no rashes or lesions  Lymph nodes:   Cervical, supraclavicular, and axillary nodes normal  Neurologic:   CNII-XII intact, normal strength, sensation and reflexes    throughout          Assessment & Plan:

## 2017-07-03 ENCOUNTER — Encounter: Payer: Self-pay | Admitting: General Practice

## 2017-07-03 DIAGNOSIS — M1611 Unilateral primary osteoarthritis, right hip: Secondary | ICD-10-CM | POA: Diagnosis not present

## 2017-07-03 LAB — HEPATITIS C ANTIBODY
Hepatitis C Ab: NONREACTIVE
SIGNAL TO CUT-OFF: 0.01 (ref <1.00)

## 2017-07-07 ENCOUNTER — Telehealth: Payer: Self-pay | Admitting: Rheumatology

## 2017-07-07 ENCOUNTER — Telehealth (INDEPENDENT_AMBULATORY_CARE_PROVIDER_SITE_OTHER): Payer: Self-pay | Admitting: Physical Medicine and Rehabilitation

## 2017-07-07 NOTE — Telephone Encounter (Signed)
ok 

## 2017-07-07 NOTE — Telephone Encounter (Signed)
Patient had injection with Dr. Ernestina Patches on Aug 24,2018. Patient was wanting to know if she could have another one, and if Dr. Estanislado Pandy needed to refer her again for that. Please call patient to inform. Call # [phone] if return call after today.

## 2017-07-08 NOTE — Telephone Encounter (Signed)
Scheduled for 07/18/17 at 0915.

## 2017-07-11 ENCOUNTER — Telehealth: Payer: Self-pay | Admitting: Family Medicine

## 2017-07-11 MED ORDER — TRAMADOL HCL 50 MG PO TABS: 50.0000 mg | ORAL_TABLET | Freq: Three times a day (TID) | ORAL | 0 refills | Status: DC | PRN

## 2017-07-11 NOTE — Telephone Encounter (Signed)
Patient is already scheduled

## 2017-07-11 NOTE — Telephone Encounter (Signed)
Medication filled to pharmacy as requested.   

## 2017-07-11 NOTE — Telephone Encounter (Signed)
Prescription sent to pharmacy.

## 2017-07-11 NOTE — Telephone Encounter (Signed)
Requesting refill of tramadol  LOV 07/02/17 with Dr. Birdie Riddle

## 2017-07-11 NOTE — Telephone Encounter (Signed)
Copied from [place]. Topic: Quick Communication - Rx Refill/Question >> Jul 11, 2017 11:05 AM Arletha Grippe wrote: Medication: tramadol   Has the patient contacted their pharmacy? No.   (Agent: If no, request that the patient contact the pharmacy for the refill.)   Preferred Pharmacy (with phone number or street name): [facility] [address] . Pt was told that if she needed more medication, and pt states that she does need another rx    Agent: Please be advised that RX refills may take up to 3 business days. We ask that you follow-up with your pharmacy.

## 2017-07-18 ENCOUNTER — Ambulatory Visit (INDEPENDENT_AMBULATORY_CARE_PROVIDER_SITE_OTHER): Payer: 59 | Admitting: Physical Medicine and Rehabilitation

## 2017-07-18 ENCOUNTER — Ambulatory Visit (INDEPENDENT_AMBULATORY_CARE_PROVIDER_SITE_OTHER): Payer: Self-pay

## 2017-07-18 ENCOUNTER — Encounter (INDEPENDENT_AMBULATORY_CARE_PROVIDER_SITE_OTHER): Payer: Self-pay | Admitting: Physical Medicine and Rehabilitation

## 2017-07-18 DIAGNOSIS — M25551 Pain in right hip: Secondary | ICD-10-CM

## 2017-07-18 MED ORDER — TRIAMCINOLONE ACETONIDE 40 MG/ML IJ SUSP
80.0000 mg | INTRAMUSCULAR | Status: AC | PRN
  Administered 2017-07-18: 80 mg via INTRA_ARTICULAR

## 2017-07-18 MED ORDER — BUPIVACAINE HCL 0.5 % IJ SOLN
3.0000 mL | INTRAMUSCULAR | Status: AC | PRN
  Administered 2017-07-18: 3 mL via INTRA_ARTICULAR

## 2017-07-18 NOTE — Patient Instructions (Signed)

## 2017-07-18 NOTE — Progress Notes (Deleted)
Pt states a sharp pain that comes and goes in her right hip and right groin. Pt states injection helped and lasted the first week of January 2019. Pt states walking and sleeping makes it worse, pt states tramadol makes it better. -Dye Allergies.

## 2017-07-18 NOTE — Progress Notes (Signed)
Gloria Frazier - 62 y.o. female MRN [id number]  Date of birth: 08-01-1955  Office Visit Note: Visit Date: 07/18/2017 PCP: Midge Minium, MD Referred by: Midge Minium, MD  Subjective: Chief Complaint  Patient presents with  . Right Hip - Pain   HPI: Gloria Frazier is a 62 year old Fe at this.  She has had a prior intra-articular hip injection excellent relief up until recently.  She reports no new injuries.  She is scheduled to have hip replacement in the late spring by Dr. Wynelle Link.    ROS Otherwise per HPI.  Assessment & Plan: Visit Diagnoses:  1. Pain in right hip     Plan: Findings:  Chronic worsening right hip pain with osteoarthritis of the right hip which is end-stage arthritis.  She got good relief with prior injection we did repeat the injection today.  She did have relief during the anesthetic phase of the injection.    Meds & Orders: No orders of the defined types were placed in this encounter.   Orders Placed This Encounter  Procedures  . Large Joint Inj: R hip joint  . XR C-ARM NO REPORT    Follow-up: Return if symptoms worsen or fail to improve.   Procedures: Large Joint Inj: R hip joint on 07/18/2017 9:52 AM Indications: diagnostic evaluation and pain Details: 22 G 3.5 in needle, fluoroscopy-guided anterior approach  Arthrogram: No  Medications: 80 mg triamcinolone acetonide 40 MG/ML; 3 mL bupivacaine 0.5 % Outcome: tolerated well, no immediate complications  There was excellent flow of contrast producing a partial arthrogram of the hip. The patient did have relief of symptoms during the anesthetic phase of the injection. Procedure, treatment alternatives, risks and benefits explained, specific risks discussed. Consent was given by the patient. Immediately prior to procedure a time out was called to verify the correct patient, procedure, equipment, support staff and site/side marked as required. Patient was prepped and draped in the usual sterile  fashion.      No notes on file   Clinical History: No specialty comments available.  She reports that  has never smoked. she has never used smokeless tobacco. No results for input(s): HGBA1C, LABURIC in the last 8760 hours.  Objective:  VS:  HT:    WT:   BMI:     BP:   HR: bpm  TEMP: ( )  RESP:  Physical Exam  Musculoskeletal:  Patient ambulates without aid with an antalgic gait to the right.  She is very stiff with hip rotation particularly internal rotation on the right with concordant pain complaints.    Ortho Exam Imaging: Xr C-arm No Report  Result Date: 07/18/2017 Please see Notes or Procedures tab for imaging impression.   Past Medical/Family/Surgical/Social History: Medications & Allergies reviewed per EMR Patient Active Problem List   Diagnosis Date Noted  . Primary osteoarthritis of both hands 01/28/2017  . Primary osteoarthritis of both hips 01/28/2017  . Flat foot 01/28/2017  . Generalized hypermobility of joints 01/28/2017  . Abdominal pain, epigastric 12/13/2013  . Routine general medical examination at a health care facility 06/10/2013  . ADHD (attention deficit hyperactivity disorder), combined type 03/09/2012  . Hip arthritis 03/09/2012  . Contact dermatitis 03/09/2012  . GERD (gastroesophageal reflux disease) 10/13/2011  . Bunion of great toe 10/13/2011  . Bruise of toe 10/13/2011   Past Medical History:  Diagnosis Date  . ADHD (attention deficit hyperactivity disorder)   . Chicken pox   . Osteoarthritis    Family History  Problem Relation Age of Onset  . Colon cancer Neg Hx   . Esophageal cancer Neg Hx   . Rectal cancer Neg Hx   . Stomach cancer Neg Hx    Past Surgical History:  Procedure Laterality Date  . ABDOMINAL HYSTERECTOMY    . BUNIONECTOMY  dec 2013   Social History   Occupational History  . Not on file  Tobacco Use  . Smoking status: Never Smoker  . Smokeless tobacco: Never Used  Substance and Sexual Activity  .  Alcohol use: Yes    Comment: occasionally  . Drug use: No  . Sexual activity: Not on file

## 2017-08-04 ENCOUNTER — Encounter: Payer: Self-pay | Admitting: Family Medicine

## 2017-08-06 ENCOUNTER — Ambulatory Visit: Payer: 59 | Admitting: Rheumatology

## 2017-08-14 NOTE — Progress Notes (Signed)
Office Visit Note  Patient: Gloria Frazier             Date of Birth: 04/10/1956           MRN: [id number]             PCP: Midge Minium, MD Referring: Midge Minium, MD Visit Date: 08/20/2017 Occupation: @GUAROCC @    Subjective:  Right hip pain    History of Present Illness: Gloria Frazier is a 61 y.o. female with history of osteoarthritis of hands and hips.  Patient states she is having a total hip arthroplasty with Dr. Maureen Ralphs on October 15, 2017.  She states that back in November 2018 she had a fall, which causes a flare of right sciatica.  She took tramadol and Mobic 15 mg for pain relief.  She also used ice and rested.  She states that in January she had a cortisone injection of her right hip.  She continues to have clicking and some limitation in motion of her right hip.   Her main concern today is her right elbow.  She has been experiencing increased pain and feels that she has been overusing her elbow.  She denies any joint swelling or warmth.  She states she bought a brace, but has not been wearing it much due to it being bulky.    Activities of Daily Living:  Patient reports morning stiffness for 0 minutes.   Patient Reports nocturnal pain.  Difficulty dressing/grooming: Denies Difficulty climbing stairs: Denies Difficulty getting out of chair: Denies Difficulty using hands for taps, buttons, cutlery, and/or writing: Denies   Review of Systems  Constitutional: Negative for fatigue and weakness.  HENT: Negative for mouth sores, mouth dryness and nose dryness.   Eyes: Negative for pain, redness, visual disturbance and dryness.  Respiratory: Negative for cough, hemoptysis, shortness of breath and difficulty breathing.   Cardiovascular: Negative for chest pain, palpitations, hypertension, irregular heartbeat and swelling in legs/feet.  Gastrointestinal: Negative for blood in stool, constipation and diarrhea.  Endocrine: Negative for increased urination.    Genitourinary: Negative for painful urination.  Musculoskeletal: Positive for arthralgias and joint pain. Negative for joint swelling, myalgias, muscle weakness, morning stiffness, muscle tenderness and myalgias.  Skin: Negative for color change, pallor, rash, hair loss, nodules/bumps, redness, skin tightness, ulcers and sensitivity to sunlight.  Allergic/Immunologic: Negative for susceptible to infections.  Neurological: Negative for dizziness, numbness and headaches.  Hematological: Negative for swollen glands.  Psychiatric/Behavioral: Negative for depressed mood and sleep disturbance. The patient is not nervous/anxious.     PMFS History:  Patient Active Problem List   Diagnosis Date Noted  . Primary osteoarthritis of both hands 01/28/2017  . Primary osteoarthritis of both hips 01/28/2017  . Flat foot 01/28/2017  . Generalized hypermobility of joints 01/28/2017  . Abdominal pain, epigastric 12/13/2013  . Routine general medical examination at a health care facility 06/10/2013  . ADHD (attention deficit hyperactivity disorder), combined type 03/09/2012  . Hip arthritis 03/09/2012  . Contact dermatitis 03/09/2012  . GERD (gastroesophageal reflux disease) 10/13/2011  . Bunion of great toe 10/13/2011  . Bruise of toe 10/13/2011    Past Medical History:  Diagnosis Date  . ADHD (attention deficit hyperactivity disorder)   . Chicken pox   . Osteoarthritis     Family History  Problem Relation Age of Onset  . Colon cancer Neg Hx   . Esophageal cancer Neg Hx   . Rectal cancer Neg Hx   . Stomach  cancer Neg Hx    Past Surgical History:  Procedure Laterality Date  . ABDOMINAL HYSTERECTOMY    . BUNIONECTOMY  dec 2013   Social History   Social History Narrative  . Not on file     Objective: Vital Signs: BP 117/74 (BP Location: Right Arm, Patient Position: Sitting, Cuff Size: Normal)   Pulse 68   Resp 16   Ht 5\' 5"  (1.651 m)   Wt 150 lb (68 kg)   BMI 24.96 kg/m     Physical Exam  Constitutional: She is oriented to person, place, and time. She appears well-developed and well-nourished.  HENT:  Head: Normocephalic and atraumatic.  Eyes: Conjunctivae and EOM are normal.  Neck: Normal range of motion.  Cardiovascular: Normal rate, regular rhythm, normal heart sounds and intact distal pulses.  Pulmonary/Chest: Effort normal and breath sounds normal.  Abdominal: Soft. Bowel sounds are normal.  Lymphadenopathy:    She has no cervical adenopathy.  Neurological: She is alert and oriented to person, place, and time.  Skin: Skin is warm and dry. Capillary refill takes less than 2 seconds.  Psychiatric: She has a normal mood and affect. Her behavior is normal.  Nursing note and vitals reviewed.    Musculoskeletal Exam: C-spine, thoracic, and lumbar spine good ROM.  No midline spinal tenderness or SI joint tenderness. Shoulder joints, elbow joints, wrist joints, MCPs, PIPs, and DIPs good ROM.  She has PIP and DIP synovial thickening consistent with osteoarthritis.  She has right lateral epicondylitis.  Right hip limited ROM with internal and external rotation with discomfort.  Left hip good ROM with no discomfort.  Bilateral knee crepitus.  No warmth or effusion.  Ankle joints, MTPs, PIPs, and DIPs good ROM with no synovitis.    CDAI Exam: No CDAI exam completed.    Investigation: No additional findings. CBC Latest Ref Rng & Units 07/02/2017 02/03/2017 07/01/2016  WBC 4.0 - 10.5 K/uL 6.2 7.7 7.5  Hemoglobin 12.0 - 15.0 g/dL 13.3 13.3 13.6  Hematocrit 36.0 - 46.0 % 40.2 40.3 40.5  Platelets 150.0 - 400.0 K/uL 312.0 314 315.0   CMP Latest Ref Rng & Units 07/02/2017 02/03/2017 07/01/2016  Glucose 70 - 99 mg/dL 86 88 89  BUN 6 - 23 mg/dL 14 14 19   Creatinine 0.40 - 1.20 mg/dL 0.73 0.80 0.83  Sodium 135 - 145 mEq/L 141 139 139  Potassium 3.5 - 5.1 mEq/L 4.3 4.0 4.2  Chloride 96 - 112 mEq/L 103 103 103  CO2 19 - 32 mEq/L 30 26 28   Calcium 8.4 - 10.5 mg/dL 9.3 9.3  9.4  Total Protein 6.0 - 8.3 g/dL 6.6 6.6 6.7  Total Bilirubin 0.2 - 1.2 mg/dL 0.6 0.5 0.6  Alkaline Phos 39 - 117 U/L 61 54 52  AST 0 - 37 U/L 23 24 23   ALT 0 - 35 U/L 20 24 23     Imaging: No results found.  Speciality Comments: No specialty comments available.    Procedures:  No procedures performed Allergies: Patient has no known allergies.   Assessment / Plan:     Visit Diagnoses: Primary osteoarthritis of both hands: She has PIP and DIP synovial thickening consistent with osteoarthritis.  She has no synovitis on exam.  Joint protection and muscle strengthening were discussed.  She has not been having any increased swelling or discomfort in her hands.  She has no tenderness on exam and she has complete fist formation.   Primary osteoarthritis of both hips - She has limited  internal and external ROM of her right hip with discomfort.  She is having a right hip arthroplasty on October 15, 2017 scheduled  w/ Dr. Wynelle Link.    Generalized hypermobility of joints  Pes planus of both feet: She wears proper fitting shoes.  She has no discomfort in her feet at this time.    Medication monitoring encounter - She takes Mobic 15 mg daily for pain relief.  She has CBC and BMP performed on 07/02/17.   Lateral epicondylitis, right elbow: She has tenderness of the right lateral epicondyle.  She was given a handout of exercises that she can perform at home.  She declined a cortisone injection at this time.  She declined physical therapy at this time.  She was advised to perform the exercises regularly and to wear the tennis elbow brace that she has at home.  She should also avoid overuse activities.     Orders: No orders of the defined types were placed in this encounter.  No orders of the defined types were placed in this encounter.   Face-to-face time spent with patient was 30 minutes. Greater than 50% of time was spent in counseling and coordination of care.  Follow-Up Instructions: Return  in about 6 months (around 02/17/2018) for Osteoarthritis.   Ofilia Neas, PA-C  Note - This record has been created using Dragon software.  Chart creation errors have been sought, but may not always  have been located. Such creation errors do not reflect on  the standard of medical care.

## 2017-08-17 ENCOUNTER — Ambulatory Visit: Payer: Self-pay | Admitting: Orthopedic Surgery

## 2017-08-18 ENCOUNTER — Ambulatory Visit (INDEPENDENT_AMBULATORY_CARE_PROVIDER_SITE_OTHER): Payer: 59 | Admitting: Emergency Medicine

## 2017-08-18 DIAGNOSIS — Z01818 Encounter for other preprocedural examination: Secondary | ICD-10-CM | POA: Diagnosis not present

## 2017-08-18 NOTE — Addendum Note (Signed)
Addended by: Midge Minium on: 08/18/2017 09:31 AM   Modules accepted: Level of Service

## 2017-08-18 NOTE — Progress Notes (Addendum)
Patient presents today for EKG. Denies any chest pain, edema or sob.  Agree w/ EKG for pt for surgical clearance.  Annye Asa, MD

## 2017-08-19 ENCOUNTER — Telehealth: Payer: Self-pay | Admitting: General Practice

## 2017-08-19 NOTE — Telephone Encounter (Signed)
Yes, form was in file cabinet up front, gave to [place].

## 2017-08-19 NOTE — Telephone Encounter (Signed)
Please advise, pt was seen yesterday for EKG for surgical clearance. PCP did not give me a form to hold on to. Pt is unsure who contacted her. Per the chart [facility] scheduled appointment.   Do you have any paperwork on patient?

## 2017-08-20 ENCOUNTER — Ambulatory Visit: Payer: 59 | Admitting: Physician Assistant

## 2017-08-20 ENCOUNTER — Encounter: Payer: Self-pay | Admitting: Physician Assistant

## 2017-08-20 ENCOUNTER — Telehealth: Payer: Self-pay | Admitting: Rheumatology

## 2017-08-20 VITALS — BP 117/74 | HR 68 | Resp 16 | Ht 65.0 in | Wt 150.0 lb

## 2017-08-20 DIAGNOSIS — M160 Bilateral primary osteoarthritis of hip: Secondary | ICD-10-CM | POA: Diagnosis not present

## 2017-08-20 DIAGNOSIS — M2142 Flat foot [pes planus] (acquired), left foot: Secondary | ICD-10-CM | POA: Diagnosis not present

## 2017-08-20 DIAGNOSIS — Z5181 Encounter for therapeutic drug level monitoring: Secondary | ICD-10-CM | POA: Diagnosis not present

## 2017-08-20 DIAGNOSIS — M19042 Primary osteoarthritis, left hand: Secondary | ICD-10-CM

## 2017-08-20 DIAGNOSIS — M2480 Other specific joint derangements of unspecified joint, not elsewhere classified: Secondary | ICD-10-CM | POA: Diagnosis not present

## 2017-08-20 DIAGNOSIS — M19041 Primary osteoarthritis, right hand: Secondary | ICD-10-CM | POA: Diagnosis not present

## 2017-08-20 DIAGNOSIS — M7711 Lateral epicondylitis, right elbow: Secondary | ICD-10-CM

## 2017-08-20 DIAGNOSIS — M2141 Flat foot [pes planus] (acquired), right foot: Secondary | ICD-10-CM | POA: Diagnosis not present

## 2017-08-20 NOTE — Telephone Encounter (Signed)
Patient called stating that her Handicap placard expires tomorrow.  Patient states she had an appointment today with Lovena Le and forgot to mention it.  Patient would like to request one for 6 months due to having hip surgery in April.  Patient asked to be called when the letter is ready for her to bring to the [facility].

## 2017-08-20 NOTE — Telephone Encounter (Signed)
Ok to provide handicap placard for 6 months.

## 2017-08-20 NOTE — Patient Instructions (Signed)
Tennis Elbow Rehab  Ask your health care provider which exercises are safe for you. Do exercises exactly as told by your health care provider and adjust them as directed. It is normal to feel mild stretching, pulling, tightness, or discomfort as you do these exercises, but you should stop right away if you feel sudden pain or your pain gets worse. Do not begin these exercises until told by your health care provider.  Stretching and range of motion exercises  These exercises warm up your muscles and joints and improve the movement and flexibility of your elbow. These exercises also help to relieve pain, numbness, and tingling.  Exercise A: Wrist extensor stretch  1. Extend your left / right elbow with your fingers pointing down.  2. Gently pull the palm of your left / right hand toward you until you feel a gentle stretch on the top of your forearm.  3. To increase the stretch, push your left / right hand toward the outer edge or pinkie side of your forearm.  4. Hold this position for __________ seconds.  Repeat __________ times. Complete this exercise __________ times a day.  If directed by your health care provider, repeat this stretch except do it with a bent elbow this time.  Exercise B: Wrist flexor stretch    1. Extend your left / right elbow and turn your palm upward.  2. Gently pull your left / right palm and fingertips back so your wrist extends and your fingers point more toward the ground.  3. You should feel a gentle stretch on the inside of your forearm.  4. Hold this position for __________ seconds.  Repeat __________ times. Complete this exercise __________ times a day.  If directed by your health care provider, repeat this stretch except do it with a bent elbow this time.  Strengthening exercises  These exercises build strength and endurance in your elbow. Endurance is the ability to use your muscles for a long time, even after they get tired.  Exercise C: Wrist extensors    1. Sit with your left /  right forearm palm-down and fully supported on a table or countertop. Your elbow should be resting below the height of your shoulder.  2. Let your left / right wrist extend over the edge of the surface.  3. Loosely hold a __________ weight or a piece of rubber exercise band or tubing in your left / right hand. Slowly curl your left / right hand up toward your forearm. If you are using band or tubing, hold the band or tubing in place with your other hand to provide resistance.  4. Hold this position for __________ seconds.  5. Slowly return to the starting position.  Repeat __________ times. Complete this exercise __________ times a day.  Exercise D: Radial deviators    1. Stand with a __________ weight in your left / righthand. Or, sit while holding a rubber exercise band or tubing with your other arm supported on a table or countertop. Position your hand so your thumb is on top.  2. Raise your hand upward in front of you so your thumb travels toward your forearm, or pull up on the rubber tubing.  3. Hold this position for __________ seconds.  4. Slowly return to the starting position.  Repeat __________ times. Complete this exercise __________ times a day.  Exercise E: Eccentric wrist extensors  1. Sit with your left / right forearm palm-down and fully supported on a table or countertop. Your   elbow should be resting below the height of your shoulder.  2. If told by your health care provider, hold a __________ weight in your hand.  3. Let your left / right wrist extend over the edge of the surface.  4. Use your other hand to lift up your left / right hand toward your forearm. Keep your forearm on the table.  5. Using only the muscles in your left / right hand, slowly lower your hand back down to the starting position.  Repeat __________ times. Complete this exercise __________ times a day.  This information is not intended to replace advice given to you by your health care provider. Make sure you discuss any  questions you have with your health care provider.  Document Released: 06/10/2005 Document Revised: 02/14/2016 Document Reviewed: 03/09/2015  Elsevier Interactive Patient Education  2018 [employer]

## 2017-08-21 NOTE — Telephone Encounter (Signed)
Placard form has been completed and is at the front desk to be picked up.  Left message on machine to advise patient she can pick up at the front desk.

## 2017-08-24 ENCOUNTER — Other Ambulatory Visit: Payer: Self-pay | Admitting: Rheumatology

## 2017-08-25 NOTE — Telephone Encounter (Signed)
Last Visit: 08/20/17 Next Visit: 02/17/18 Labs: 07/02/17 WNL  Okay to refill per Dr. Estanislado Pandy

## 2017-09-10 ENCOUNTER — Ambulatory Visit: Payer: 59 | Admitting: Rheumatology

## 2017-10-13 ENCOUNTER — Other Ambulatory Visit: Payer: Self-pay | Admitting: Family Medicine

## 2017-10-13 ENCOUNTER — Encounter: Payer: Self-pay | Admitting: Internal Medicine

## 2017-10-13 MED ORDER — TRAMADOL HCL 50 MG PO TABS: 50.0000 mg | ORAL_TABLET | Freq: Three times a day (TID) | ORAL | 0 refills | Status: DC | PRN

## 2017-10-13 MED ORDER — METHYLPHENIDATE HCL 10 MG PO TABS
ORAL_TABLET | ORAL | 0 refills | Status: DC
Start: 2017-10-13 — End: 2018-07-03

## 2017-10-13 MED ORDER — METHYLPHENIDATE HCL ER (OSM) 18 MG PO TBCR: 18.0000 mg | EXTENDED_RELEASE_TABLET | Freq: Every day | ORAL | 0 refills | Status: DC

## 2017-10-13 NOTE — Telephone Encounter (Signed)
Copied from [place] [id number]. Topic: Quick Communication - Rx Refill/Question >> Oct 13, 2017 10:10 AM Oliver Pila B wrote: Medication: methylphenidate (RITALIN) 10 MG tablet [[id number]] methylphenidate 18 MG PO CR tablet [[id number]]  traMADol (ULTRAM) 50 MG tablet [[id number]]  Has the patient contacted their pharmacy? Yes.   (Agent: If no, request that the patient contact the pharmacy for the refill.) [facility]erred Pharm[facility] (with phone number [facility] street [facility] Agent: Please be advised that RX refills may take up to 3 business days. We ask that you follow-up with your pharmacy.

## 2017-10-13 NOTE — Telephone Encounter (Signed)
Request for:  methylphenidate 10 mg tab, last refill on 06/06/18 for #30 tabs Methylphenidate 18 mg PO CR tab, last refill on 07/12/17 for #30 tabs; Tramadol 50 mg, last refill on 07/11/17 for #30 tabs.  Provider  Dr. Birdie Riddle LOV 07/02/17 NOV  07/03/18  Pharmacy  [facility] [phone]  [address]

## 2017-10-13 NOTE — Telephone Encounter (Signed)
Reviewed chart in PCPs absence. Refilled meds; request is appropriate

## 2017-10-14 ENCOUNTER — Encounter: Payer: Self-pay | Admitting: Internal Medicine

## 2017-10-14 ENCOUNTER — Ambulatory Visit (AMBULATORY_SURGERY_CENTER): Payer: Self-pay | Admitting: *Deleted

## 2017-10-14 ENCOUNTER — Other Ambulatory Visit: Payer: Self-pay

## 2017-10-14 VITALS — Ht 65.0 in | Wt 148.6 lb

## 2017-10-14 DIAGNOSIS — Z1211 Encounter for screening for malignant neoplasm of colon: Secondary | ICD-10-CM

## 2017-10-14 MED ORDER — PEG-KCL-NACL-NASULF-NA ASC-C 140 G PO SOLR
1.0000 | ORAL | 0 refills | Status: DC
Start: 2017-10-14 — End: 2017-10-28

## 2017-10-14 NOTE — Progress Notes (Signed)
No egg or soy allergy known to patient  No issues with past sedation with any surgeries  or procedures, no intubation problems  No diet pills per patient No home 02 use per patient  No blood thinners per patient  Pt denies issues with constipation  No A fib or A flutter  EMMI video sent to pt's e mail - pt declined  plenvu sample given  lot 71036 11/2018   As directed

## 2017-10-15 ENCOUNTER — Inpatient Hospital Stay (HOSPITAL_COMMUNITY): Admission: RE | Admit: 2017-10-15 | Payer: 59 | Source: Ambulatory Visit | Admitting: Orthopedic Surgery

## 2017-10-15 ENCOUNTER — Encounter (HOSPITAL_COMMUNITY): Admission: RE | Payer: Self-pay | Source: Ambulatory Visit

## 2017-10-15 SURGERY — ARTHROPLASTY, HIP, TOTAL, ANTERIOR APPROACH
Anesthesia: Choice | Site: Hip | Laterality: Right

## 2017-10-20 ENCOUNTER — Telehealth (INDEPENDENT_AMBULATORY_CARE_PROVIDER_SITE_OTHER): Payer: Self-pay | Admitting: Physical Medicine and Rehabilitation

## 2017-10-20 NOTE — Telephone Encounter (Signed)
Yes okay 

## 2017-10-20 NOTE — Telephone Encounter (Signed)
Scheduled for 10/23/17 at 1445.

## 2017-10-23 ENCOUNTER — Ambulatory Visit (INDEPENDENT_AMBULATORY_CARE_PROVIDER_SITE_OTHER): Payer: 59 | Admitting: Physical Medicine and Rehabilitation

## 2017-10-23 ENCOUNTER — Ambulatory Visit (INDEPENDENT_AMBULATORY_CARE_PROVIDER_SITE_OTHER): Payer: 59

## 2017-10-23 ENCOUNTER — Encounter (INDEPENDENT_AMBULATORY_CARE_PROVIDER_SITE_OTHER): Payer: Self-pay | Admitting: Physical Medicine and Rehabilitation

## 2017-10-23 DIAGNOSIS — M25551 Pain in right hip: Secondary | ICD-10-CM

## 2017-10-23 NOTE — Patient Instructions (Signed)

## 2017-10-23 NOTE — Progress Notes (Signed)
 .  Numeric Pain Rating Scale and Functional Assessment Average Pain 3   In the last MONTH (on 0-10 scale) has pain interfered with the following?  1. General activity like being  able to carry out your everyday physical activities such as walking, climbing stairs, carrying groceries, or moving a chair?  Rating(0)   +Driver, -BT, -Dye Allergies. 

## 2017-10-23 NOTE — Progress Notes (Signed)
Gloria Frazier - 62 y.o. female MRN [id number]  Date of birth: Jun 20, 1956  Office Visit Note: Visit Date: 10/23/2017 PCP: Midge Minium, MD Referred by: Midge Minium, MD  Subjective: Chief Complaint  Patient presents with  . Right Hip - Pain   HPI: [name] is a 62 year old female with known hip osteoarthritis on the right.  She has been having worsening right hip and groin pain over the last several weeks.  We have completed 2 prior injections one in August of last year and one in January of this year.  She is followed by Dr. Estanislado Pandy and Dr. Gaynelle Arabian.  She is scheduled to have hip replacement in July.  Last injection gave her greater than 90% relief for quite a while.  We will repeat the injection today.   ROS Otherwise per HPI.  Assessment & Plan: Visit Diagnoses:  1. Pain in right hip     Plan: Findings:  Diagnostic and therapeutic anesthetic hip arthrogram on the right.  Patient had good relief relief during the anesthetic phase of the injection.    Meds & Orders: No orders of the defined types were placed in this encounter.   Orders Placed This Encounter  Procedures  . Large Joint Inj: R hip joint  . XR C-ARM NO REPORT    Follow-up: Return if symptoms worsen or fail to improve.   Procedures: Large Joint Inj: R hip joint on 10/23/2017 3:12 PM Indications: pain and diagnostic evaluation Details: 22 G needle, anterior approach  Arthrogram: Yes  Medications: 80 mg triamcinolone acetonide 40 MG/ML; 3 mL bupivacaine 0.5 % Outcome: tolerated well, no immediate complications  Arthrogram demonstrated excellent flow of contrast throughout the joint surface without extravasation or obvious defect.  The patient had relief of symptoms during the anesthetic phase of the injection.  Procedure, treatment alternatives, risks and benefits explained, specific risks discussed. Consent was given by the patient. Immediately prior to procedure a time out was called  to verify the correct patient, procedure, equipment, support staff and site/side marked as required. Patient was prepped and draped in the usual sterile fashion.      No notes on file   Clinical History: No specialty comments available.   She reports that she has never smoked. She has never used smokeless tobacco. No results for input(s): HGBA1C, LABURIC in the last 8760 hours.  Objective:  VS:  HT:    WT:   BMI:     BP:   HR: bpm  TEMP: ( )  RESP:  Physical Exam  Ortho Exam Imaging: No results found.  Past Medical/Family/Surgical/Social History: Medications & Allergies reviewed per EMR, new medications updated. Patient Active Problem List   Diagnosis Date Noted  . Primary osteoarthritis of both hands 01/28/2017  . Primary osteoarthritis of both hips 01/28/2017  . Flat foot 01/28/2017  . Generalized hypermobility of joints 01/28/2017  . Abdominal pain, epigastric 12/13/2013  . Routine general medical examination at a health care facility 06/10/2013  . ADHD (attention deficit hyperactivity disorder), combined type 03/09/2012  . Hip arthritis 03/09/2012  . Contact dermatitis 03/09/2012  . GERD (gastroesophageal reflux disease) 10/13/2011  . Bunion of great toe 10/13/2011  . Bruise of toe 10/13/2011   Past Medical History:  Diagnosis Date  . ADHD (attention deficit hyperactivity disorder)   . Anxiety   . Chicken pox   . Osteoarthritis    hands, hip, elbow   Family History  Problem Relation Age of Onset  .  Colon cancer Neg Hx   . Esophageal cancer Neg Hx   . Rectal cancer Neg Hx   . Stomach cancer Neg Hx   . Colon polyps Neg Hx    Past Surgical History:  Procedure Laterality Date  . ABDOMINAL HYSTERECTOMY    . BUNIONECTOMY Bilateral aug 2013, dec 2013  . COLONOSCOPY  2009   Medoff- tics and hems    Social History   Occupational History  . Not on file  Tobacco Use  . Smoking status: Never Smoker  . Smokeless tobacco: Never Used  Substance and Sexual  Activity  . Alcohol use: Yes    Comment: occasionally  . Drug use: No  . Sexual activity: Not on file

## 2017-10-28 ENCOUNTER — Encounter: Payer: Self-pay | Admitting: Internal Medicine

## 2017-10-28 ENCOUNTER — Ambulatory Visit (AMBULATORY_SURGERY_CENTER): Payer: 59 | Admitting: Internal Medicine

## 2017-10-28 ENCOUNTER — Other Ambulatory Visit: Payer: Self-pay

## 2017-10-28 VITALS — BP 141/72 | HR 59 | Temp 98.9°F | Resp 11 | Ht 65.0 in | Wt 150.0 lb

## 2017-10-28 DIAGNOSIS — D123 Benign neoplasm of transverse colon: Secondary | ICD-10-CM

## 2017-10-28 DIAGNOSIS — Z1212 Encounter for screening for malignant neoplasm of rectum: Secondary | ICD-10-CM

## 2017-10-28 DIAGNOSIS — Z1211 Encounter for screening for malignant neoplasm of colon: Secondary | ICD-10-CM | POA: Diagnosis present

## 2017-10-28 DIAGNOSIS — K6389 Other specified diseases of intestine: Secondary | ICD-10-CM | POA: Diagnosis not present

## 2017-10-28 HISTORY — PX: COLONOSCOPY W/ POLYPECTOMY: SHX1380

## 2017-10-28 MED ORDER — SODIUM CHLORIDE 0.9 % IV SOLN: 500.0000 mL | Freq: Once | INTRAVENOUS | Status: DC

## 2017-10-28 NOTE — Progress Notes (Signed)
Called to room to assist during endoscopic procedure.  Patient ID and intended procedure confirmed with present staff. Received instructions for my participation in the procedure from the performing physician.  

## 2017-10-28 NOTE — Patient Instructions (Addendum)
*  Handout given to care partner on polyps, diverticulosis and discussed melanosis.  YOU HAD AN ENDOSCOPIC PROCEDURE TODAY AT [facility]:   Refer to the procedure report that was given to you for any specific questions about what was found during the examination.  If the procedure report does not answer your questions, please call your gastroenterologist to clarify.  If you requested that your care partner not be given the details of your procedure findings, then the procedure report has been included in a sealed envelope for you to review at your convenience later.  YOU SHOULD EXPECT: Some feelings of bloating in the abdomen. Passage of more gas than usual.  Walking can help get rid of the air that was put into your GI tract during the procedure and reduce the bloating. If you had a lower endoscopy (such as a colonoscopy or flexible sigmoidoscopy) you may notice spotting of blood in your stool or on the toilet paper. If you underwent a bowel prep for your procedure, you may not have a normal bowel movement for a few days.  Please Note:  You might notice some irritation and congestion in your nose or some drainage.  This is from the oxygen used during your procedure.  There is no need for concern and it should clear up in a day or so.  SYMPTOMS TO REPORT IMMEDIATELY:   Following lower endoscopy (colonoscopy or flexible sigmoidoscopy):  Excessive amounts of blood in the stool  Significant tenderness or worsening of abdominal pains  Swelling of the abdomen that is new, acute  Fever of 100F or higher   For urgent or emergent issues, a gastroenterologist can be reached at any hour by calling [phone].   DIET:  We do recommend a small meal at first, but then you may proceed to your regular diet.  Drink plenty of fluids but you should avoid alcoholic beverages for 24 hours.  ACTIVITY:  You should plan to take it easy for the rest of today and you should NOT DRIVE or use heavy  machinery until tomorrow (because of the sedation medicines used during the test).    FOLLOW UP: Our staff will call the number listed on your records the next business day following your procedure to check on you and address any questions or concerns that you may have regarding the information given to you following your procedure. If we do not reach you, we will leave a message.  However, if you are feeling well and you are not experiencing any problems, there is no need to return our call.  We will assume that you have returned to your regular daily activities without incident.  If any biopsies were taken you will be contacted by phone or by letter within the next 1-3 weeks.  Please call us at [phone] if you have not heard about the biopsies in 3 weeks.    SIGNATURES/CONFIDENTIALITY: You and/or your care partner have signed paperwork which will be entered into your electronic medical record.  These signatures attest to the fact that that the information above on your After Visit Summary has been reviewed and is understood.  Full responsibility of the confidentiality of this discharge information lies with you and/or your care-partner.

## 2017-10-28 NOTE — Op Note (Signed)
[facility] Patient Name: Gloria Frazier Procedure Date: 10/28/2017 1:10 PM MRN: [id number] Endoscopist: Docia Chuck. Henrene Pastor , MD Age: 62 Referring MD:  Date of Birth: February 06, 1956 Gender: Female Account #: [id number] Procedure:                Colonoscopy, with cold snare polypectomy x 2 Indications:              Screening for colorectal malignant neoplasm.                            Negative index exam 2009 with Dr. Earlean Shawl Medicines:                Monitored Anesthesia Care Procedure:                Pre-Anesthesia Assessment:                           - Prior to the procedure, a History and Physical                            was performed, and patient medications and                            allergies were reviewed. The patient's tolerance of                            previous anesthesia was also reviewed. The risks                            and benefits of the procedure and the sedation                            options and risks were discussed with the patient.                            All questions were answered, and informed consent                            was obtained. Prior Anticoagulants: The patient has                            taken no previous anticoagulant or antiplatelet                            agents. ASA Grade Assessment: II - A patient with                            mild systemic disease. After reviewing the risks                            and benefits, the patient was deemed in                            satisfactory condition to undergo the procedure.  After obtaining informed consent, the colonoscope                            was passed under direct vision. Throughout the                            procedure, the patient's blood pressure, pulse, and                            oxygen saturations were monitored continuously. The                            Colonoscope was introduced through the anus and       advanced to the the cecum, identified by                            appendiceal orifice and ileocecal valve. The                            ileocecal valve, appendiceal orifice, and rectum                            were photographed. The quality of the bowel                            preparation was excellent. The colonoscopy was                            performed without difficulty. The patient tolerated                            the procedure well. The bowel preparation used was                            SUPREP. Scope In: 1:15:27 PM Scope Out: 1:39:29 PM Scope Withdrawal Time: 0 hours 19 minutes 48 seconds  Total Procedure Duration: 0 hours 24 minutes 2 seconds  Findings:                 Two polyps were found in the transverse colon. The                            polyps were 3 mm in size. These polyps were removed                            with a cold snare. Resection and retrieval were                            complete.                           Multiple diverticula were found in the entire                            colon. Internal hemorrhoids  present.                           A diffuse area of moderate melanosis was found in                            the entire colon.                           The exam was otherwise without abnormality on                            direct and retroflexion views. Complications:            No immediate complications. Estimated blood loss:                            None. Estimated Blood Loss:     Estimated blood loss: none. Impression:               - Two 3 mm polyps in the transverse colon, removed                            with a cold snare. Resected and retrieved.                           - Diverticulosis in the entire examined colon.                           - Melanosis in the colon.                           - The examination was otherwise normal on direct                            and retroflexion views. Recommendation:            - Repeat colonoscopy in 5or 10 years for                            surveillance, based on final pathology results.                           - Patient has a contact number available for                            emergencies. The signs and symptoms of potential                            delayed complications were discussed with the                            patient. Return to normal activities tomorrow.                            Written discharge instructions were provided to the  patient.                           - Resume previous diet.                           - Continue present medications.                           - Await pathology results. Docia Chuck. Henrene Pastor, MD 10/28/2017 1:46:58 PM This report has been signed electronically.

## 2017-10-28 NOTE — Progress Notes (Signed)
Report to PACU, RN, vss, BBS= Clear.  

## 2017-10-29 ENCOUNTER — Telehealth: Payer: Self-pay

## 2017-10-29 NOTE — Telephone Encounter (Signed)
  Follow up Call-  Call back number 10/28/2017  Post procedure Call Back phone  # [phone]  Permission to leave phone message Yes  Some recent data might be hidden     Patient questions:  Do you have a fever, pain , or abdominal swelling? No. Pain Score  0 *  Have you tolerated food without any problems? Yes.    Have you been able to return to your normal activities? Yes.    Do you have any questions about your discharge instructions: Diet   No. Medications  No. Follow up visit  No.  Do you have questions or concerns about your Care? No.  Actions: * If pain score is 4 or above: No action needed, pain <4.

## 2017-10-29 NOTE — Telephone Encounter (Signed)
Left message

## 2017-11-04 ENCOUNTER — Encounter: Payer: Self-pay | Admitting: Internal Medicine

## 2017-11-06 MED ORDER — TRIAMCINOLONE ACETONIDE 40 MG/ML IJ SUSP
80.0000 mg | INTRAMUSCULAR | Status: AC | PRN
  Administered 2017-10-23: 80 mg via INTRA_ARTICULAR

## 2017-11-06 MED ORDER — BUPIVACAINE HCL 0.5 % IJ SOLN
3.0000 mL | INTRAMUSCULAR | Status: AC | PRN
  Administered 2017-10-23: 3 mL via INTRA_ARTICULAR

## 2017-12-05 ENCOUNTER — Other Ambulatory Visit: Payer: Self-pay | Admitting: Family Medicine

## 2017-12-05 NOTE — Telephone Encounter (Signed)
Last OV 07/02/17 Tramadol last filled 10/13/17 #30 with 0  Please advise, pt was suppose to have surgery on 10/15/17 and it was canceled. We completed an EKG for surgical clearance on 08/18/17. Ok to write a letter for clearance? Dr. Maureen Ralphs - right total arthroplasty

## 2017-12-05 NOTE — Telephone Encounter (Signed)
Copied from [place] [phone]. Topic: Inquiry >> Dec 05, 2017  2:28 PM Oliver Pila B wrote: Reason for CRM: pt called and is needing pcp to have a letter of medical clearance so that she can have hip replacement surgery; as well she is needing a refill of Tramadol to have up until her surgery in July; call pt to advise

## 2017-12-05 NOTE — Telephone Encounter (Signed)
Tramadol refill. Pt is asking for refill of medication until she has hip surgery in July. Last Refill:10/13/17 #30 Last OV: 07/02/17 PCP: Dr. Birdie Riddle [facility] [address]  Pt requesting a letter of medical clearance from PCP sot that she can have hip replacement surgery. Pt states she is scheduled to have surgery in July.

## 2017-12-08 ENCOUNTER — Other Ambulatory Visit: Payer: Self-pay | Admitting: Rheumatology

## 2017-12-08 ENCOUNTER — Encounter: Payer: Self-pay | Admitting: General Practice

## 2017-12-08 MED ORDER — TRAMADOL HCL 50 MG PO TABS: 50.0000 mg | ORAL_TABLET | Freq: Three times a day (TID) | ORAL | 0 refills | Status: DC | PRN

## 2017-12-08 NOTE — Telephone Encounter (Signed)
Letter written and faxed to Dr. Elmyra Ricks.

## 2017-12-08 NOTE — Telephone Encounter (Signed)
Last Visit: 08/20/17 Next Visit: 02/17/18 Labs: 07/02/17 WNL  Okay to refill per Dr. Estanislado Pandy

## 2017-12-08 NOTE — Telephone Encounter (Signed)
[place] for letter indicating ok for surgical clearance, refill provided

## 2017-12-18 DIAGNOSIS — Z01419 Encounter for gynecological examination (general) (routine) without abnormal findings: Secondary | ICD-10-CM | POA: Diagnosis not present

## 2017-12-18 DIAGNOSIS — Z6824 Body mass index (BMI) 24.0-24.9, adult: Secondary | ICD-10-CM | POA: Diagnosis not present

## 2017-12-18 DIAGNOSIS — Z13820 Encounter for screening for osteoporosis: Secondary | ICD-10-CM | POA: Diagnosis not present

## 2017-12-18 NOTE — H&P (Signed)
TOTAL HIP ADMISSION H&P  Patient is admitted for right total hip arthroplasty.  Subjective:  Chief Complaint: right hip pain  HPI: Gloria Frazier, 62 y.o. female, has a history of pain and functional disability in the right hip(s) due to arthritis and patient has failed non-surgical conservative treatments for greater than 12 weeks to include NSAID's and/or analgesics, corticosteriod injections and activity modification.  Onset of symptoms was gradual starting 2 years ago with gradually worsening course since that time.The patient noted no past surgery on the right hip(s).  Patient currently rates pain in the right hip at 4 out of 10 with activity. Patient has night pain, worsening of pain with activity and weight bearing, pain that interfers with activities of daily living and increasing instability due to sharp pain. Patient has evidence of joint space narrowing by imaging studies. This condition presents safety issues increasing the risk of falls.    There is no current active infection.  ALLERGIES: NKDA  Patient Active Problem List   Diagnosis Date Noted  . Primary osteoarthritis of both hands 01/28/2017  . Primary osteoarthritis of both hips 01/28/2017  . Flat foot 01/28/2017  . Generalized hypermobility of joints 01/28/2017  . Abdominal pain, epigastric 12/13/2013  . Routine general medical examination at a health care facility 06/10/2013  . ADHD (attention deficit hyperactivity disorder), combined type 03/09/2012  . Hip arthritis 03/09/2012  . Contact dermatitis 03/09/2012  . GERD (gastroesophageal reflux disease) 10/13/2011  . Bunion of great toe 10/13/2011  . Bruise of toe 10/13/2011   Past Medical History:  Diagnosis Date  . ADHD (attention deficit hyperactivity disorder)   . Anxiety   . Chicken pox   . Osteoarthritis    hands, hip, elbow    Past Surgical History:  Procedure Laterality Date  . ABDOMINAL HYSTERECTOMY    . BUNIONECTOMY Bilateral aug 2013, dec 2013  .  COLONOSCOPY  2009   Medoff- tics and hems     Current Facility-Administered Medications  Medication Dose Route Frequency Provider Last Rate Last Dose  . 0.9 %  sodium chloride infusion  500 mL Intravenous Once Irene Shipper, MD       Current Outpatient Medications  Medication Sig Dispense Refill Last Dose  . ALPRAZolam (XANAX) 0.5 MG tablet Take 0.5 mg by mouth daily as needed for anxiety. Reported on 06/29/2015   Past Week  . Calcium-Magnesium-Vitamin D 300-150-400 MG-MG-UNIT TABS Take by mouth daily.   Past Week  . meloxicam (MOBIC) 7.5 MG tablet TAKE 1 TABLET BY MOUTH TWICE A DAY 60 tablet 2   . methylphenidate (RITALIN) 10 MG tablet 1 tab daily at lunch if needed for additional concentration (Patient taking differently: Take 10 mg by mouth daily as needed. 1 tab daily at lunch if needed for additional concentration) 30 tablet 0 Past Week  . methylphenidate 18 MG PO CR tablet Take 1 tablet (18 mg total) by mouth daily. 30 tablet 0 Past Week  . Multiple Vitamin (MULTIVITAMIN) tablet Take 1 tablet by mouth daily.    10/27/2017  . Omega-3 1000 MG CAPS Take 1 capsule by mouth daily.    10/27/2017  . PREMARIN 0.45 MG tablet Take 0.45 mg by mouth daily.    Past Week  . traMADol (ULTRAM) 50 MG tablet Take 1 tablet (50 mg total) by mouth every 8 (eight) hours as needed. 30 tablet 0   . tretinoin (RETIN-A) 0.025 % cream Apply 1 application topically at bedtime.      No Known  Allergies  Social History   Tobacco Use  . Smoking status: Never Smoker  . Smokeless tobacco: Never Used  Substance Use Topics  . Alcohol use: Yes    Comment: occasionally    Family History  Problem Relation Age of Onset  . Colon cancer Neg Hx   . Esophageal cancer Neg Hx   . Rectal cancer Neg Hx   . Stomach cancer Neg Hx   . Colon polyps Neg Hx      Review of Systems  Constitutional: Negative for chills, fever, malaise/fatigue and weight loss.  HENT: Negative for hearing loss and tinnitus.   Eyes: Negative for  double vision and pain.  Respiratory: Negative for cough, shortness of breath and wheezing.   Cardiovascular: Negative for chest pain.  Gastrointestinal: Negative for abdominal pain, heartburn, nausea and vomiting.  Genitourinary: Negative.   Musculoskeletal: Positive for joint pain and neck pain.  Skin: Negative.   Neurological: Negative for dizziness and weakness.  Endo/Heme/Allergies: Negative.   Psychiatric/Behavioral: Negative for depression and memory loss.    Objective:  Physical Exam  Constitutional: She is oriented to person, place, and time. She appears well-developed and well-nourished.  HENT:  Head: Normocephalic and atraumatic.  Eyes: Pupils are equal, round, and reactive to light. Conjunctivae and EOM are normal.  Neck: Normal range of motion.  Pain to full extent of cervical  Cardiovascular: Normal rate, regular rhythm, normal heart sounds and intact distal pulses. Exam reveals no gallop and no friction rub.  No murmur heard. Respiratory: Effort normal and breath sounds normal. She has no wheezes.  GI: Soft. Bowel sounds are normal.  Musculoskeletal:       Right hip: She exhibits decreased range of motion.  Right hip ROM flex 100,IR 0,ER 20.ABD 20   Neurological: She is alert and oriented to person, place, and time.  Skin: Skin is warm and dry.  Psychiatric: She has a normal mood and affect. Her behavior is normal. Judgment and thought content normal.    Vital signs in last 24 hours: BP: ()/()  Arterial Line BP: ()/()   Labs:   Estimated body mass index is 24.96 kg/m as calculated from the following:   Height as of 10/28/17: 5\' 5"  (1.651 m).   Weight as of 10/28/17: 68 kg (150 lb).   Imaging Review Plain radiographs demonstrate moderate degenerative joint disease of the right hip(s). The bone quality appears to be satisfactory for age and reported activity level.AP pelvis and lateral of right hip show moderate to advanced degenerative changes with some  bone-on-bone elements.    Preoperative templating of the joint replacement has been completed, documented, and submitted to the Operating Room personnel in order to optimize intra-operative equipment management.     Assessment/Plan:  End stage arthritis, right hip(s)  The patient history, physical examination, clinical judgement of the provider and imaging studies are consistent with end stage degenerative joint disease of the right hip(s) and total hip arthroplasty is deemed medically necessary. The treatment options including medical management, injection therapy, arthroscopy and arthroplasty were discussed at length. The risks and benefits of total hip arthroplasty were presented and reviewed. The risks due to aseptic loosening, infection, stiffness, dislocation/subluxation,  thromboembolic complications and other imponderables were discussed.  The patient acknowledged the explanation, agreed to proceed with the plan and consent was signed. Patient is being admitted for inpatient treatment for surgery, pain control, PT, OT, prophylactic antibiotics, VTE prophylaxis, progressive ambulation and ADL's and discharge planning.The patient is planning to be  discharged home with home health services   PCP-Dr. Annye Asa IV TXA Patient will follow up 2 weeks post op-appointment made

## 2017-12-22 ENCOUNTER — Ambulatory Visit
Admission: RE | Admit: 2017-12-22 | Discharge: 2017-12-22 | Disposition: A | Discharge status: Home / Self Care | Payer: 59 | Source: Ambulatory Visit | Attending: Obstetrics & Gynecology | Admitting: Obstetrics & Gynecology

## 2017-12-22 ENCOUNTER — Other Ambulatory Visit: Payer: 59

## 2017-12-22 ENCOUNTER — Encounter (HOSPITAL_COMMUNITY): Payer: Self-pay

## 2017-12-22 DIAGNOSIS — N6320 Unspecified lump in the left breast, unspecified quadrant: Secondary | ICD-10-CM

## 2017-12-22 DIAGNOSIS — R922 Inconclusive mammogram: Secondary | ICD-10-CM | POA: Diagnosis not present

## 2017-12-22 NOTE — Patient Instructions (Signed)
Your procedure is scheduled on: Wednesday, December 31, 2017   Surgery Time:  10:00AM-11:30AM   Report to [facility] Main  Entrance    Report to admitting at 7:30 AM   Call this number if you have problems the morning of surgery [phone]   Do not eat food or drink liquids :After Midnight.   Do NOT smoke after Midnight   Take these medicines the morning of surgery with A SIP OF WATER: Xanax and Tramadol if needed                               You may not have any metal on your body including hair pins, jewelry, and body piercings             Do not wear make-up, lotions, powders, perfumes/cologne, or deodorant             Do not wear nail polish.  Do not shave  48 hours prior to surgery.               Do not bring valuables to the hospital. [place].   Contacts, dentures or bridgework may not be worn into surgery.   Leave suitcase in the car. After surgery it may be brought to your room.   Special Instructions: Bring a copy of your healthcare power of attorney and living will documents         the day of surgery if you haven't scanned them in before.              Please read over the following fact sheets you were given:  [facility] - Preparing for Surgery Before surgery, you can play an important role.  Because skin is not sterile, your skin needs to be as free of germs as possible.  You can reduce the number of germs on your skin by washing with CHG (chlorahexidine gluconate) soap before surgery.  CHG is an antiseptic cleaner which kills germs and bonds with the skin to continue killing germs even after washing. Please DO NOT use if you have an allergy to CHG or antibacterial soaps.  If your skin becomes reddened/irritated stop using the CHG and inform your nurse when you arrive at Short Stay. Do not shave (including legs and underarms) for at least 48 hours prior to the first CHG shower.  You may shave your  face/neck.  Please follow these instructions carefully:  1.  Shower with CHG Soap the night before surgery and the  morning of surgery.  2.  If you choose to wash your hair, wash your hair first as usual with your normal  shampoo.  3.  After you shampoo, rinse your hair and body thoroughly to remove the shampoo.                             4.  Use CHG as you would any other liquid soap.  You can apply chg directly to the skin and wash.  Gently with a scrungie or clean washcloth.  5.  Apply the CHG Soap to your body ONLY FROM THE NECK DOWN.   Do   not use on face/ open  Wound or open sores. Avoid contact with eyes, ears mouth and   genitals (private parts).                       Wash face,  Genitals (private parts) with your normal soap.             6.  Wash thoroughly, paying special attention to the area where your    surgery  will be performed.  7.  Thoroughly rinse your body with warm water from the neck down.  8.  DO NOT shower/wash with your normal soap after using and rinsing off the CHG Soap.                9.  Pat yourself dry with a clean towel.            10.  Wear clean pajamas.            11.  Place clean sheets on your bed the night of your first shower and do not  sleep with pets. Day of Surgery : Do not apply any lotions/deodorants the morning of surgery.  Please wear clean clothes to the hospital/surgery center.  FAILURE TO FOLLOW THESE INSTRUCTIONS MAY RESULT IN THE CANCELLATION OF YOUR SURGERY  PATIENT SIGNATURE_________________________________  NURSE SIGNATURE__________________________________  ________________________________________________________________________   Gloria Frazier  An incentive spirometer is a tool that can help keep your lungs clear and active. This tool measures how well you are filling your lungs with each breath. Taking long deep breaths may help reverse or decrease the chance of developing breathing (pulmonary)  problems (especially infection) following:  A long period of time when you are unable to move or be active. BEFORE THE PROCEDURE   If the spirometer includes an indicator to show your best effort, your nurse or respiratory therapist will set it to a desired goal.  If possible, sit up straight or lean slightly forward. Try not to slouch.  Hold the incentive spirometer in an upright position. INSTRUCTIONS FOR USE  1. Sit on the edge of your bed if possible, or sit up as far as you can in bed or on a chair. 2. Hold the incentive spirometer in an upright position. 3. Breathe out normally. 4. Place the mouthpiece in your mouth and seal your lips tightly around it. 5. Breathe in slowly and as deeply as possible, raising the piston or the ball toward the top of the column. 6. Hold your breath for 3-5 seconds or for as long as possible. Allow the piston or ball to fall to the bottom of the column. 7. Remove the mouthpiece from your mouth and breathe out normally. 8. Rest for a few seconds and repeat Steps 1 through 7 at least 10 times every 1-2 hours when you are awake. Take your time and take a few normal breaths between deep breaths. 9. The spirometer may include an indicator to show your best effort. Use the indicator as a goal to work toward during each repetition. 10. After each set of 10 deep breaths, practice coughing to be sure your lungs are clear. If you have an incision (the cut made at the time of surgery), support your incision when coughing by placing a pillow or rolled up towels firmly against it. Once you are able to get out of bed, walk around indoors and cough well. You may stop using the incentive spirometer when instructed by your caregiver.  RISKS AND COMPLICATIONS  Take your time  so you do not get dizzy or light-headed.  If you are in pain, you may need to take or ask for pain medication before doing incentive spirometry. It is harder to take a deep breath if you are having  pain. AFTER USE  Rest and breathe slowly and easily.  It can be helpful to keep track of a log of your progress. Your caregiver can provide you with a simple table to help with this. If you are using the spirometer at home, follow these instructions: [place] IF:   You are having difficultly using the spirometer.  You have trouble using the spirometer as often as instructed.  Your pain medication is not giving enough relief while using the spirometer.  You develop fever of 100.5 F (38.1 C) or higher. SEEK IMMEDIATE MEDICAL CARE IF:   You cough up bloody sputum that had not been present before.  You develop fever of 102 F (38.9 C) or greater.  You develop worsening pain at or near the incision site. MAKE SURE YOU:   Understand these instructions.  Will watch your condition.  Will get help right away if you are not doing well or get worse. Document Released: 10/21/2006 Document Revised: 09/02/2011 Document Reviewed: 12/22/2006 Exit[employer] Patient Information 2014 [employer], [place].   ________________________________________________________________________  WHAT IS A BLOOD TRANSFUSION? Blood Transfusion Information  A transfusion is the replacement of blood or some of its parts. Blood is made up of multiple cells which provide different functions.  Red blood cells carry oxygen and are used for blood loss replacement.  White blood cells fight against infection.  Platelets control bleeding.  Plasma helps clot blood.  Other blood products are available for specialized needs, such as hemophilia or other clotting disorders. BEFORE THE TRANSFUSION  Who gives blood for transfusions?   Healthy volunteers who are fully evaluated to make sure their blood is safe. This is blood bank blood. Transfusion therapy is the safest it has ever been in the practice of medicine. Before blood is taken from a donor, a complete history is taken to make sure that person has no history  of diseases nor engages in risky social behavior (examples are intravenous drug use or sexual activity with multiple partners). The donor's travel history is screened to minimize risk of transmitting infections, such as malaria. The donated blood is tested for signs of infectious diseases, such as HIV and hepatitis. The blood is then tested to be sure it is compatible with you in order to minimize the chance of a transfusion reaction. If you or a relative donates blood, this is often done in anticipation of surgery and is not appropriate for emergency situations. It takes many days to process the donated blood. RISKS AND COMPLICATIONS Although transfusion therapy is very safe and saves many lives, the main dangers of transfusion include:   Getting an infectious disease.  Developing a transfusion reaction. This is an allergic reaction to something in the blood you were given. Every precaution is taken to prevent this. The decision to have a blood transfusion has been considered carefully by your caregiver before blood is given. Blood is not given unless the benefits outweigh the risks. AFTER THE TRANSFUSION  Right after receiving a blood transfusion, you will usually feel much better and more energetic. This is especially true if your red blood cells have gotten low (anemic). The transfusion raises the level of the red blood cells which carry oxygen, and this usually causes an energy increase.  The  nurse administering the transfusion will monitor you carefully for complications. HOME CARE INSTRUCTIONS  No special instructions are needed after a transfusion. You may find your energy is better. Speak with your caregiver about any limitations on activity for underlying diseases you may have. SEEK MEDICAL CARE IF:   Your condition is not improving after your transfusion.  You develop redness or irritation at the intravenous (IV) site. SEEK IMMEDIATE MEDICAL CARE IF:  Any of the following symptoms  occur over the next 12 hours:  Shaking chills.  You have a temperature by mouth above 102 F (38.9 C), not controlled by medicine.  Chest, back, or muscle pain.  People around you feel you are not acting correctly or are confused.  Shortness of breath or difficulty breathing.  Dizziness and fainting.  You get a rash or develop hives.  You have a decrease in urine output.  Your urine turns a dark color or changes to pink, red, or brown. Any of the following symptoms occur over the next 10 days:  You have a temperature by mouth above 102 F (38.9 C), not controlled by medicine.  Shortness of breath.  Weakness after normal activity.  The white part of the eye turns yellow (jaundice).  You have a decrease in the amount of urine or are urinating less often.  Your urine turns a dark color or changes to pink, red, or brown. Document Released: 06/07/2000 Document Revised: 09/02/2011 Document Reviewed: 01/25/2008 [facility] Patient Information 2014 [place], [place].  _______________________________________________________________________

## 2017-12-22 NOTE — Pre-Procedure Instructions (Signed)
EKG 08/18/2017 in epic

## 2017-12-23 ENCOUNTER — Encounter (HOSPITAL_COMMUNITY)
Admission: RE | Admit: 2017-12-23 | Discharge: 2017-12-23 | Disposition: A | Discharge status: Home / Self Care | Payer: 59 | Source: Ambulatory Visit | Attending: Orthopedic Surgery | Admitting: Orthopedic Surgery

## 2017-12-23 ENCOUNTER — Other Ambulatory Visit: Payer: Self-pay

## 2017-12-23 ENCOUNTER — Encounter (HOSPITAL_COMMUNITY): Payer: Self-pay

## 2017-12-23 DIAGNOSIS — Z0183 Encounter for blood typing: Secondary | ICD-10-CM | POA: Insufficient documentation

## 2017-12-23 DIAGNOSIS — Z01812 Encounter for preprocedural laboratory examination: Secondary | ICD-10-CM | POA: Diagnosis present

## 2017-12-23 DIAGNOSIS — M1611 Unilateral primary osteoarthritis, right hip: Secondary | ICD-10-CM | POA: Insufficient documentation

## 2017-12-23 HISTORY — DX: Diverticulosis of large intestine without perforation or abscess without bleeding: K57.30

## 2017-12-23 HISTORY — DX: Other hemorrhoids: K64.8

## 2017-12-23 HISTORY — DX: Personal history of other diseases of the digestive system: Z87.19

## 2017-12-23 HISTORY — DX: Personal history of other diseases of the respiratory system: Z87.09

## 2017-12-23 HISTORY — DX: Adverse effect of unspecified anesthetic, initial encounter: T41.45XA

## 2017-12-23 HISTORY — DX: Personal history of colonic polyps: Z86.010

## 2017-12-23 HISTORY — DX: Personal history of colon polyps, unspecified: Z86.0100

## 2017-12-23 HISTORY — DX: Other complications of anesthesia, initial encounter: T88.59XA

## 2017-12-23 HISTORY — DX: Gastro-esophageal reflux disease without esophagitis: K21.9

## 2017-12-23 LAB — CBC
HEMATOCRIT: 41.3 % (ref 36.0–46.0)
HEMOGLOBIN: 13.9 g/dL (ref 12.0–15.0)
MCH: 30.8 pg (ref 26.0–34.0)
MCHC: 33.7 g/dL (ref 30.0–36.0)
MCV: 91.4 fL (ref 78.0–100.0)
Platelets: 336 10*3/uL (ref 150–400)
RBC: 4.52 MIL/uL (ref 3.87–5.11)
RDW: 11.9 % (ref 11.5–15.5)
WBC: 7.4 10*3/uL (ref 4.0–10.5)

## 2017-12-23 LAB — URINALYSIS, ROUTINE W REFLEX MICROSCOPIC
Bilirubin Urine: NEGATIVE
Glucose, UA: NEGATIVE mg/dL
Ketones, ur: NEGATIVE mg/dL
Leukocytes, UA: NEGATIVE
Nitrite: NEGATIVE
Protein, ur: NEGATIVE mg/dL
Specific Gravity, Urine: 1.006 (ref 1.005–1.030)
pH: 6 (ref 5.0–8.0)

## 2017-12-23 LAB — COMPREHENSIVE METABOLIC PANEL
ALT: 31 U/L (ref 0–44)
AST: 27 U/L (ref 15–41)
Albumin: 3.9 g/dL (ref 3.5–5.0)
Alkaline Phosphatase: 92 U/L (ref 38–126)
Anion gap: 8 (ref 5–15)
BUN: 17 mg/dL (ref 8–23)
CHLORIDE: 105 mmol/L (ref 98–111)
CO2: 28 mmol/L (ref 22–32)
Calcium: 9.4 mg/dL (ref 8.9–10.3)
Creatinine, Ser: 0.75 mg/dL (ref 0.44–1.00)
GFR calc Af Amer: >60 mL/min (ref >60)
GFR calc non Af Amer: >60 mL/min (ref >60)
GLUCOSE: 95 mg/dL (ref 70–99)
POTASSIUM: 4.1 mmol/L (ref 3.5–5.1)
Sodium: 141 mmol/L (ref 135–145)
Total Bilirubin: 0.2 mg/dL — ABNORMAL LOW (ref 0.3–1.2)
Total Protein: 7.3 g/dL (ref 6.5–8.1)

## 2017-12-23 LAB — PROTIME-INR
INR: 0.87
PROTHROMBIN TIME: 11.8 s (ref 11.4–15.2)

## 2017-12-23 LAB — APTT: aPTT: 29 seconds (ref 24–36)

## 2017-12-23 LAB — SURGICAL PCR SCREEN
MRSA, PCR: NEGATIVE
STAPHYLOCOCCUS AUREUS: POSITIVE — AB

## 2017-12-23 LAB — ABO/RH: ABO/RH(D): O POS

## 2017-12-23 NOTE — Progress Notes (Signed)
12-23-17 UA result routed to Dr. Wynelle Link for review

## 2017-12-24 NOTE — Pre-Procedure Instructions (Signed)
Mupirocin called in to [facility].

## 2017-12-24 NOTE — Pre-Procedure Instructions (Signed)
PCR results 12/23/2017 faxed to Dr. Wynelle Link via epic.

## 2017-12-24 NOTE — Pre-Procedure Instructions (Signed)
Gloria Frazier returned call she is aware she needs to pick up her Mupirocin and begin to use twice daily for 5 days prior to surgery.

## 2017-12-30 MED ORDER — SODIUM CHLORIDE 0.9 % IV SOLN
1000.0000 mg | INTRAVENOUS | Status: AC
  Administered 2017-12-31: 1000 mg via INTRAVENOUS
  Filled 2017-12-30: qty 1100

## 2017-12-30 MED ORDER — TRANEXAMIC ACID 1000 MG/10ML IV SOLN
2000.0000 mg | INTRAVENOUS | Status: DC
  Filled 2017-12-30: qty 20

## 2017-12-31 ENCOUNTER — Inpatient Hospital Stay (HOSPITAL_COMMUNITY): Payer: 59

## 2017-12-31 ENCOUNTER — Inpatient Hospital Stay (HOSPITAL_COMMUNITY): Payer: 59 | Admitting: Certified Registered Nurse Anesthetist

## 2017-12-31 ENCOUNTER — Inpatient Hospital Stay (HOSPITAL_COMMUNITY)
Admission: RE | Admit: 2017-12-31 | Discharge: 2018-01-01 | DRG: 470 | Disposition: A | Discharge status: Home / Self Care | Payer: 59 | Attending: Orthopedic Surgery | Admitting: Orthopedic Surgery

## 2017-12-31 ENCOUNTER — Other Ambulatory Visit: Payer: Self-pay

## 2017-12-31 ENCOUNTER — Encounter (HOSPITAL_COMMUNITY)
Admission: RE | Disposition: A | Discharge status: Home / Self Care | Payer: Self-pay | Source: Home / Self Care | Attending: Orthopedic Surgery

## 2017-12-31 ENCOUNTER — Encounter (HOSPITAL_COMMUNITY): Payer: Self-pay | Admitting: *Deleted

## 2017-12-31 DIAGNOSIS — Z8619 Personal history of other infectious and parasitic diseases: Secondary | ICD-10-CM | POA: Diagnosis not present

## 2017-12-31 DIAGNOSIS — M169 Osteoarthritis of hip, unspecified: Secondary | ICD-10-CM | POA: Diagnosis present

## 2017-12-31 DIAGNOSIS — M19041 Primary osteoarthritis, right hand: Secondary | ICD-10-CM | POA: Diagnosis present

## 2017-12-31 DIAGNOSIS — M2140 Flat foot [pes planus] (acquired), unspecified foot: Secondary | ICD-10-CM | POA: Diagnosis present

## 2017-12-31 DIAGNOSIS — Z79899 Other long term (current) drug therapy: Secondary | ICD-10-CM | POA: Diagnosis not present

## 2017-12-31 DIAGNOSIS — I739 Peripheral vascular disease, unspecified: Secondary | ICD-10-CM | POA: Diagnosis not present

## 2017-12-31 DIAGNOSIS — Z96649 Presence of unspecified artificial hip joint: Secondary | ICD-10-CM

## 2017-12-31 DIAGNOSIS — Z86010 Personal history of colonic polyps: Secondary | ICD-10-CM

## 2017-12-31 DIAGNOSIS — L259 Unspecified contact dermatitis, unspecified cause: Secondary | ICD-10-CM | POA: Diagnosis present

## 2017-12-31 DIAGNOSIS — M160 Bilateral primary osteoarthritis of hip: Secondary | ICD-10-CM | POA: Diagnosis not present

## 2017-12-31 DIAGNOSIS — F902 Attention-deficit hyperactivity disorder, combined type: Secondary | ICD-10-CM | POA: Diagnosis present

## 2017-12-31 DIAGNOSIS — Z791 Long term (current) use of non-steroidal anti-inflammatories (NSAID): Secondary | ICD-10-CM | POA: Diagnosis not present

## 2017-12-31 DIAGNOSIS — M19042 Primary osteoarthritis, left hand: Secondary | ICD-10-CM | POA: Diagnosis present

## 2017-12-31 DIAGNOSIS — M1611 Unilateral primary osteoarthritis, right hip: Secondary | ICD-10-CM | POA: Diagnosis not present

## 2017-12-31 DIAGNOSIS — Z90710 Acquired absence of both cervix and uterus: Secondary | ICD-10-CM | POA: Diagnosis not present

## 2017-12-31 DIAGNOSIS — M25751 Osteophyte, right hip: Secondary | ICD-10-CM | POA: Diagnosis present

## 2017-12-31 DIAGNOSIS — M1610 Unilateral primary osteoarthritis, unspecified hip: Secondary | ICD-10-CM

## 2017-12-31 DIAGNOSIS — Z471 Aftercare following joint replacement surgery: Secondary | ICD-10-CM | POA: Diagnosis not present

## 2017-12-31 DIAGNOSIS — Z96641 Presence of right artificial hip joint: Secondary | ICD-10-CM | POA: Diagnosis not present

## 2017-12-31 DIAGNOSIS — K219 Gastro-esophageal reflux disease without esophagitis: Secondary | ICD-10-CM | POA: Diagnosis present

## 2017-12-31 DIAGNOSIS — M25551 Pain in right hip: Secondary | ICD-10-CM | POA: Diagnosis not present

## 2017-12-31 HISTORY — PX: TOTAL HIP ARTHROPLASTY: SHX124

## 2017-12-31 LAB — TYPE AND SCREEN
ABO/RH(D): O POS
ANTIBODY SCREEN: NEGATIVE

## 2017-12-31 SURGERY — ARTHROPLASTY, HIP, TOTAL, ANTERIOR APPROACH
Anesthesia: Monitor Anesthesia Care | Site: Hip | Laterality: Right

## 2017-12-31 MED ORDER — BUPIVACAINE IN DEXTROSE 0.75-8.25 % IT SOLN
INTRATHECAL | Status: DC | PRN
  Administered 2017-12-31: 1.6 mL via INTRATHECAL

## 2017-12-31 MED ORDER — METHOCARBAMOL 500 MG PO TABS
500.0000 mg | ORAL_TABLET | Freq: Four times a day (QID) | ORAL | Status: DC | PRN
  Administered 2017-12-31 – 2018-01-01 (×2): 500 mg via ORAL
  Filled 2017-12-31 (×2): qty 1

## 2017-12-31 MED ORDER — MIDAZOLAM HCL 5 MG/5ML IJ SOLN
INTRAMUSCULAR | Status: DC | PRN
  Administered 2017-12-31: 2 mg via INTRAVENOUS

## 2017-12-31 MED ORDER — METOCLOPRAMIDE HCL 5 MG/ML IJ SOLN: 5.0000 mg | Freq: Three times a day (TID) | INTRAMUSCULAR | Status: DC | PRN

## 2017-12-31 MED ORDER — MENTHOL 3 MG MT LOZG: 1.0000 | LOZENGE | OROMUCOSAL | Status: DC | PRN

## 2017-12-31 MED ORDER — FENTANYL CITRATE (PF) 100 MCG/2ML IJ SOLN
INTRAMUSCULAR | Status: DC | PRN
  Administered 2017-12-31: 50 ug via INTRAVENOUS

## 2017-12-31 MED ORDER — PROPOFOL 10 MG/ML IV BOLUS
INTRAVENOUS | Status: AC
  Filled 2017-12-31: qty 60

## 2017-12-31 MED ORDER — ACETAMINOPHEN 500 MG PO TABS
500.0000 mg | ORAL_TABLET | Freq: Four times a day (QID) | ORAL | Status: DC
  Administered 2017-12-31 – 2018-01-01 (×2): 500 mg via ORAL
  Filled 2017-12-31 (×3): qty 1

## 2017-12-31 MED ORDER — SODIUM CHLORIDE 0.9 % IV SOLN
INTRAVENOUS | Status: DC
  Administered 2017-12-31: 13:00:00 via INTRAVENOUS

## 2017-12-31 MED ORDER — TRETINOIN 0.025 % EX CREA: 1.0000 "application " | TOPICAL_CREAM | Freq: Every day | CUTANEOUS | Status: DC

## 2017-12-31 MED ORDER — PHENYLEPHRINE HCL 10 MG/ML IJ SOLN
INTRAVENOUS | Status: DC | PRN
  Administered 2017-12-31: 40 ug/min via INTRAVENOUS

## 2017-12-31 MED ORDER — BISACODYL 10 MG RE SUPP: 10.0000 mg | Freq: Every day | RECTAL | Status: DC | PRN

## 2017-12-31 MED ORDER — FENTANYL CITRATE (PF) 100 MCG/2ML IJ SOLN
INTRAMUSCULAR | Status: AC
  Administered 2017-12-31: 25 ug via INTRAVENOUS
  Filled 2017-12-31: qty 2

## 2017-12-31 MED ORDER — MORPHINE SULFATE (PF) 2 MG/ML IV SOLN
0.5000 mg | INTRAVENOUS | Status: DC | PRN
  Administered 2018-01-01: 0.5 mg via INTRAVENOUS
  Filled 2017-12-31: qty 1

## 2017-12-31 MED ORDER — CEFAZOLIN SODIUM-DEXTROSE 1-4 GM/50ML-% IV SOLN
1.0000 g | Freq: Four times a day (QID) | INTRAVENOUS | Status: AC
  Administered 2017-12-31 (×2): 1 g via INTRAVENOUS
  Filled 2017-12-31 (×2): qty 50

## 2017-12-31 MED ORDER — CEFAZOLIN SODIUM-DEXTROSE 2-4 GM/100ML-% IV SOLN
2.0000 g | INTRAVENOUS | Status: AC
  Administered 2017-12-31: 2 g via INTRAVENOUS
  Filled 2017-12-31: qty 100

## 2017-12-31 MED ORDER — SODIUM CHLORIDE 0.9 % IJ SOLN
INTRAMUSCULAR | Status: DC | PRN
  Administered 2017-12-31: 30 mL

## 2017-12-31 MED ORDER — METOCLOPRAMIDE HCL 5 MG PO TABS: 5.0000 mg | ORAL_TABLET | Freq: Three times a day (TID) | ORAL | Status: DC | PRN

## 2017-12-31 MED ORDER — ASPIRIN EC 325 MG PO TBEC
325.0000 mg | DELAYED_RELEASE_TABLET | Freq: Two times a day (BID) | ORAL | Status: DC
  Administered 2018-01-01: 325 mg via ORAL
  Filled 2017-12-31: qty 1

## 2017-12-31 MED ORDER — LACTATED RINGERS IV SOLN
INTRAVENOUS | Status: DC
  Administered 2017-12-31 (×2): via INTRAVENOUS

## 2017-12-31 MED ORDER — TRANEXAMIC ACID 1000 MG/10ML IV SOLN
1000.0000 mg | Freq: Once | INTRAVENOUS | Status: AC
  Administered 2017-12-31: 1000 mg via INTRAVENOUS
  Filled 2017-12-31: qty 1100

## 2017-12-31 MED ORDER — HYDROCODONE-ACETAMINOPHEN 7.5-325 MG PO TABS
1.0000 | ORAL_TABLET | ORAL | Status: DC | PRN
  Administered 2017-12-31 – 2018-01-01 (×5): 2 via ORAL
  Filled 2017-12-31 (×5): qty 2

## 2017-12-31 MED ORDER — HYDROCODONE-ACETAMINOPHEN 5-325 MG PO TABS
1.0000 | ORAL_TABLET | ORAL | Status: DC | PRN
  Administered 2017-12-31: 2 via ORAL
  Filled 2017-12-31: qty 2

## 2017-12-31 MED ORDER — METHYLPHENIDATE HCL ER (OSM) 18 MG PO TBCR: 18.0000 mg | EXTENDED_RELEASE_TABLET | Freq: Every day | ORAL | Status: DC

## 2017-12-31 MED ORDER — TRAMADOL HCL 50 MG PO TABS
50.0000 mg | ORAL_TABLET | Freq: Four times a day (QID) | ORAL | Status: DC | PRN
  Administered 2018-01-01: 100 mg via ORAL
  Filled 2017-12-31: qty 2
  Filled 2017-12-31: qty 1

## 2017-12-31 MED ORDER — FENTANYL CITRATE (PF) 100 MCG/2ML IJ SOLN
25.0000 ug | INTRAMUSCULAR | Status: DC | PRN
  Administered 2017-12-31 (×2): 25 ug via INTRAVENOUS
  Administered 2017-12-31: 50 ug via INTRAVENOUS

## 2017-12-31 MED ORDER — METHYLPHENIDATE HCL 10 MG PO TABS: 10.0000 mg | ORAL_TABLET | Freq: Every day | ORAL | Status: DC | PRN

## 2017-12-31 MED ORDER — DEXAMETHASONE SODIUM PHOSPHATE 10 MG/ML IJ SOLN
10.0000 mg | Freq: Once | INTRAMUSCULAR | Status: AC
  Administered 2018-01-01: 10 mg via INTRAVENOUS
  Filled 2017-12-31: qty 1

## 2017-12-31 MED ORDER — ONDANSETRON HCL 4 MG/2ML IJ SOLN
INTRAMUSCULAR | Status: AC
  Filled 2017-12-31: qty 2

## 2017-12-31 MED ORDER — PHENYLEPHRINE 40 MCG/ML (10ML) SYRINGE FOR IV PUSH (FOR BLOOD PRESSURE SUPPORT)
PREFILLED_SYRINGE | INTRAVENOUS | Status: AC
  Filled 2017-12-31: qty 10

## 2017-12-31 MED ORDER — ACETAMINOPHEN 10 MG/ML IV SOLN
1000.0000 mg | Freq: Four times a day (QID) | INTRAVENOUS | Status: DC
  Administered 2017-12-31: 1000 mg via INTRAVENOUS
  Filled 2017-12-31: qty 100

## 2017-12-31 MED ORDER — SODIUM CHLORIDE 0.9 % IJ SOLN
INTRAMUSCULAR | Status: AC
  Filled 2017-12-31: qty 50

## 2017-12-31 MED ORDER — PROPOFOL 500 MG/50ML IV EMUL
INTRAVENOUS | Status: DC | PRN
  Administered 2017-12-31: 100 ug/kg/min via INTRAVENOUS

## 2017-12-31 MED ORDER — OXYCODONE HCL 5 MG/5ML PO SOLN
5.0000 mg | Freq: Once | ORAL | Status: DC | PRN
  Filled 2017-12-31: qty 5

## 2017-12-31 MED ORDER — ONDANSETRON HCL 4 MG/2ML IJ SOLN
INTRAMUSCULAR | Status: DC | PRN
  Administered 2017-12-31: 4 mg via INTRAVENOUS

## 2017-12-31 MED ORDER — DOCUSATE SODIUM 100 MG PO CAPS
100.0000 mg | ORAL_CAPSULE | Freq: Two times a day (BID) | ORAL | Status: DC
  Administered 2017-12-31 – 2018-01-01 (×2): 100 mg via ORAL
  Filled 2017-12-31 (×2): qty 1

## 2017-12-31 MED ORDER — METHOCARBAMOL 1000 MG/10ML IJ SOLN
500.0000 mg | Freq: Four times a day (QID) | INTRAVENOUS | Status: DC | PRN
  Administered 2017-12-31: 500 mg via INTRAVENOUS
  Filled 2017-12-31: qty 550

## 2017-12-31 MED ORDER — BUPIVACAINE-EPINEPHRINE (PF) 0.25% -1:200000 IJ SOLN
INTRAMUSCULAR | Status: AC
  Filled 2017-12-31: qty 30

## 2017-12-31 MED ORDER — FENTANYL CITRATE (PF) 100 MCG/2ML IJ SOLN
INTRAMUSCULAR | Status: AC
  Filled 2017-12-31: qty 2

## 2017-12-31 MED ORDER — DEXAMETHASONE SODIUM PHOSPHATE 10 MG/ML IJ SOLN
INTRAMUSCULAR | Status: AC
  Filled 2017-12-31: qty 1

## 2017-12-31 MED ORDER — PROPOFOL 10 MG/ML IV BOLUS
INTRAVENOUS | Status: DC | PRN
  Administered 2017-12-31: 30 mg via INTRAVENOUS

## 2017-12-31 MED ORDER — CHLORHEXIDINE GLUCONATE 4 % EX LIQD: 60.0000 mL | Freq: Once | CUTANEOUS | Status: DC

## 2017-12-31 MED ORDER — MIDAZOLAM HCL 2 MG/2ML IJ SOLN
INTRAMUSCULAR | Status: AC
  Filled 2017-12-31: qty 2

## 2017-12-31 MED ORDER — EPHEDRINE 5 MG/ML INJ
INTRAVENOUS | Status: AC
  Filled 2017-12-31: qty 10

## 2017-12-31 MED ORDER — ONDANSETRON HCL 4 MG/2ML IJ SOLN: 4.0000 mg | Freq: Four times a day (QID) | INTRAMUSCULAR | Status: DC | PRN

## 2017-12-31 MED ORDER — PHENOL 1.4 % MT LIQD: 1.0000 | OROMUCOSAL | Status: DC | PRN

## 2017-12-31 MED ORDER — POLYETHYLENE GLYCOL 3350 17 G PO PACK: 17.0000 g | PACK | Freq: Every day | ORAL | Status: DC | PRN

## 2017-12-31 MED ORDER — DEXAMETHASONE SODIUM PHOSPHATE 10 MG/ML IJ SOLN
8.0000 mg | Freq: Once | INTRAMUSCULAR | Status: AC
  Administered 2017-12-31: 8 mg via INTRAVENOUS

## 2017-12-31 MED ORDER — ONDANSETRON HCL 4 MG PO TABS: 4.0000 mg | ORAL_TABLET | Freq: Four times a day (QID) | ORAL | Status: DC | PRN

## 2017-12-31 MED ORDER — BUPIVACAINE-EPINEPHRINE (PF) 0.25% -1:200000 IJ SOLN
INTRAMUSCULAR | Status: DC | PRN
  Administered 2017-12-31: 30 mL

## 2017-12-31 MED ORDER — DIPHENHYDRAMINE HCL 12.5 MG/5ML PO ELIX: 12.5000 mg | ORAL_SOLUTION | ORAL | Status: DC | PRN

## 2017-12-31 MED ORDER — PROPOFOL 10 MG/ML IV BOLUS
INTRAVENOUS | Status: AC
  Filled 2017-12-31: qty 20

## 2017-12-31 MED ORDER — SODIUM CHLORIDE 0.9 % IJ SOLN
INTRAMUSCULAR | Status: AC
  Filled 2017-12-31: qty 10

## 2017-12-31 MED ORDER — FLEET ENEMA 7-19 GM/118ML RE ENEM: 1.0000 | ENEMA | Freq: Once | RECTAL | Status: DC | PRN

## 2017-12-31 MED ORDER — OXYCODONE HCL 5 MG PO TABS: 5.0000 mg | ORAL_TABLET | Freq: Once | ORAL | Status: DC | PRN

## 2017-12-31 MED ORDER — ALPRAZOLAM 0.5 MG PO TABS
0.5000 mg | ORAL_TABLET | Freq: Every day | ORAL | Status: DC | PRN
  Administered 2017-12-31: 0.5 mg via ORAL
  Filled 2017-12-31: qty 1

## 2017-12-31 SURGICAL SUPPLY — 37 items
BAG DECANTER FOR FLEXI CONT (MISCELLANEOUS) ×2 IMPLANT
BAG SPEC THK2 15X12 ZIP CLS (MISCELLANEOUS)
BAG ZIPLOCK 12X15 (MISCELLANEOUS) IMPLANT
BLADE EXTENDED COATED 6.5IN (ELECTRODE) ×2 IMPLANT
BLADE SAG 18X100X1.27 (BLADE) ×2 IMPLANT
CAPT HIP TOTAL 2 ×1 IMPLANT
CLOTH BEACON ORANGE TIMEOUT ST (SAFETY) ×2 IMPLANT
COVER PERINEAL POST (MISCELLANEOUS) ×2 IMPLANT
COVER SURGICAL LIGHT HANDLE (MISCELLANEOUS) ×2 IMPLANT
DECANTER SPIKE VIAL GLASS SM (MISCELLANEOUS) ×3 IMPLANT
DRAPE STERI IOBAN 125X83 (DRAPES) ×2 IMPLANT
DRAPE U-SHAPE 47X51 STRL (DRAPES) ×4 IMPLANT
DRSG ADAPTIC 3X8 NADH LF (GAUZE/BANDAGES/DRESSINGS) ×2 IMPLANT
DRSG MEPILEX BORDER 4X4 (GAUZE/BANDAGES/DRESSINGS) ×2 IMPLANT
DRSG MEPILEX BORDER 4X8 (GAUZE/BANDAGES/DRESSINGS) ×2 IMPLANT
DURAPREP 26ML APPLICATOR (WOUND CARE) ×2 IMPLANT
ELECT REM PT RETURN 15FT ADLT (MISCELLANEOUS) ×2 IMPLANT
EVACUATOR 1/8 PVC DRAIN (DRAIN) ×2 IMPLANT
GLOVE BIO SURGEON STRL SZ7 (GLOVE) ×2 IMPLANT
GLOVE BIO SURGEON STRL SZ8 (GLOVE) ×2 IMPLANT
GLOVE BIOGEL PI IND STRL 7.0 (GLOVE) ×1 IMPLANT
GLOVE BIOGEL PI IND STRL 8 (GLOVE) ×1 IMPLANT
GLOVE BIOGEL PI INDICATOR 7.0 (GLOVE) ×1
GLOVE BIOGEL PI INDICATOR 8 (GLOVE) ×1
GOWN STRL REUS W/TWL LRG LVL3 (GOWN DISPOSABLE) ×2 IMPLANT
GOWN STRL REUS W/TWL XL LVL3 (GOWN DISPOSABLE) ×2 IMPLANT
PACK ANTERIOR HIP CUSTOM (KITS) ×2 IMPLANT
STRIP CLOSURE SKIN 1/2X4 (GAUZE/BANDAGES/DRESSINGS) ×2 IMPLANT
SUT ETHIBOND NAB CT1 #1 30IN (SUTURE) ×2 IMPLANT
SUT MNCRL AB 4-0 PS2 18 (SUTURE) ×2 IMPLANT
SUT STRATAFIX 0 PDS 27 VIOLET (SUTURE) ×2
SUT VIC AB 2-0 CT1 27 (SUTURE) ×4
SUT VIC AB 2-0 CT1 TAPERPNT 27 (SUTURE) ×2 IMPLANT
SUTURE STRATFX 0 PDS 27 VIOLET (SUTURE) ×1 IMPLANT
SYR 50ML LL SCALE MARK (SYRINGE) IMPLANT
TRAY FOLEY MTR SLVR 16FR STAT (SET/KITS/TRAYS/PACK) ×2 IMPLANT
YANKAUER SUCT BULB TIP 10FT TU (MISCELLANEOUS) ×2 IMPLANT

## 2017-12-31 NOTE — Op Note (Signed)
OPERATIVE REPORT- TOTAL HIP ARTHROPLASTY   PREOPERATIVE DIAGNOSIS: Osteoarthritis of the Right hip.   POSTOPERATIVE DIAGNOSIS: Osteoarthritis of the Right  hip.   PROCEDURE: Right total hip arthroplasty, anterior approach.   SURGEON: Gaynelle Arabian, MD   ASSISTANT: Theresa Duty, PA-C  ANESTHESIA:  Spinal  ESTIMATED BLOOD LOSS:-200 mL    DRAINS: Hemovac x1.   COMPLICATIONS: None   CONDITION: PACU - hemodynamically stable.   BRIEF CLINICAL NOTE: Gloria Frazier is a 62 y.o. female who has advanced end-  stage arthritis of their Right  hip with progressively worsening pain and  dysfunction.The patient has failed nonoperative management and presents for  total hip arthroplasty.   PROCEDURE IN DETAIL: After successful administration of spinal  anesthetic, the traction boots for the [facility] bed were placed on both  feet and the patient was placed onto the [facility] bed, boots placed into the leg  holders. The Right hip was then isolated from the perineum with plastic  drapes and prepped and draped in the usual sterile fashion. ASIS and  greater trochanter were marked and a oblique incision was made, starting  at about 1 cm lateral and 2 cm distal to the ASIS and coursing towards  the anterior cortex of the femur. The skin was cut with a 10 blade  through subcutaneous tissue to the level of the fascia overlying the  tensor fascia lata muscle. The fascia was then incised in line with the  incision at the junction of the anterior third and posterior 2/3rd. The  muscle was teased off the fascia and then the interval between the TFL  and the rectus was developed. The Hohmann retractor was then placed at  the top of the femoral neck over the capsule. The vessels overlying the  capsule were cauterized and the fat on top of the capsule was removed.  A Hohmann retractor was then placed anterior underneath the rectus  femoris to give exposure to the entire anterior capsule. A T-shaped   capsulotomy was performed. The edges were tagged and the femoral head  was identified.       Osteophytes are removed off the superior acetabulum.  The femoral neck was then cut in situ with an oscillating saw. Traction  was then applied to the left lower extremity utilizing the [facility]  traction. The femoral head was then removed. Retractors were placed  around the acetabulum and then circumferential removal of the labrum was  performed. Osteophytes were also removed. Reaming starts at 47 mm to  medialize and  Increased in 2 mm increments to 49 mm. We reamed in  approximately 40 degrees of abduction, 20 degrees anteversion. A 50 mm  pinnacle acetabular shell was then impacted in anatomic position under  fluoroscopic guidance with excellent purchase. We did not need to place  any additional dome screws. A 32 mm neutral + 4 marathon liner was then  placed into the acetabular shell.       The femoral lift was then placed along the lateral aspect of the femur  just distal to the vastus ridge. The leg was  externally rotated and capsule  was stripped off the inferior aspect of the femoral neck down to the  level of the lesser trochanter, this was done with electrocautery. The femur was lifted after this was performed. The  leg was then placed in an extended and adducted position essentially delivering the femur. We also removed the capsule superiorly and the piriformis from the piriformis  fossa to gain excellent exposure of the  proximal femur. Rongeur was used to remove some cancellous bone to get  into the lateral portion of the proximal femur for placement of the  initial starter reamer. The starter broaches was placed  the starter broach  and was shown to go down the center of the canal. Broaching  with the  Actis system was then performed starting at size 0, coursing  Up to size 3. A size 3 had excellent torsional and rotational  and axial stability. The trial standard offset neck was then  placed  with a 32+ 13.5 trial head. The hip was then reduced. We confirmed that  the stem was in the canal both on AP and lateral x-rays. It also has excellent sizing. The hip was reduced with outstanding stability through full extension and full external rotation.. AP pelvis was taken and the leg lengths were measured and found to be equal. Hip was then dislocated again and the femoral head and neck removed. The  femoral broach was removed. Size 3 Actis stem with a standard offset  neck was then impacted into the femur following native anteversion. Has  excellent purchase in the canal. Excellent torsional and rotational and  axial stability. It is confirmed to be in the canal on AP and lateral  fluoroscopic views. The 32 +13.5 metal head was placed and the hip  reduced with outstanding stability. Again AP pelvis was taken and it  confirmed that the leg lengths were equal. The wound was then copiously  irrigated with saline solution and the capsule reattached and repaired  with Ethibond suture. 30 ml of .25% Bupivicaine was  injected into the capsule and into the edge of the tensor fascia lata as well as subcutaneous tissue. The fascia overlying the tensor fascia lata was then closed with a running #1 V-Loc. Subcu was closed with interrupted 2-0 Vicryl and subcuticular running 4-0 Monocryl. Incision was cleaned  and dried. Steri-Strips and a bulky sterile dressing applied. Hemovac  drain was hooked to suction and then the patient was awakened and transported to  recovery in stable condition.        Please note that a surgical assistant was a medical necessity for this procedure to perform it in a safe and expeditious manner. Assistant was necessary to provide appropriate retraction of vital neurovascular structures and to prevent femoral fracture and allow for anatomic placement of the prosthesis.  Gaynelle Arabian, M.D.

## 2017-12-31 NOTE — Transfer of Care (Signed)
Immediate Anesthesia Transfer of Care Note  Patient: Gloria Frazier  Procedure(s) Performed: RIGHT TOTAL HIP ARTHROPLASTY ANTERIOR APPROACH (Right Hip)  Patient Location: PACU  Anesthesia Type:Spinal  Level of Consciousness: awake, alert  and oriented  Airway & Oxygen Therapy: Patient Spontanous Breathing and Patient connected to face mask oxygen  Post-op Assessment: Report given to RN and Post -op Vital signs reviewed and stable  Post vital signs: Reviewed and stable  Last Vitals:  Vitals Value Taken Time  BP 95/55 12/31/2017 11:21 AM  Temp    Pulse 67 12/31/2017 11:23 AM  Resp    SpO2 100 % 12/31/2017 11:23 AM  Vitals shown include unvalidated device data.  Last Pain:  Vitals:   12/31/17 0811  TempSrc:   PainSc: 2       Patients Stated Pain Goal: 1 ([id number])  Complications: No apparent anesthesia complications

## 2017-12-31 NOTE — Anesthesia Procedure Notes (Signed)
Spinal  Patient location during procedure: OR Start time: 12/31/2017 9:36 AM End time: 12/31/2017 9:40 AM Reason for block: at surgeon's request Staffing Resident/CRNA: Anne Fu, CRNA Performed: resident/CRNA  Preanesthetic Checklist Completed: patient identified, site marked, surgical consent, pre-op evaluation, timeout performed, IV checked, risks and benefits discussed and monitors and equipment checked Spinal Block Patient position: sitting Prep: DuraPrep Patient monitoring: heart rate, continuous pulse ox and blood pressure Approach: right paramedian Location: L2-3 Injection technique: single-shot Needle Needle type: Pencan  Needle gauge: 24 G Needle length: 9 cm Assessment Sensory level: T6 Additional Notes Expiration date of kit checked and confirmed. Patient tolerated procedure well, without complications. X 1 attempt with noted clear CSF return. Loss of motor and sensory on exam post injection.

## 2017-12-31 NOTE — Anesthesia Postprocedure Evaluation (Signed)
Anesthesia Post Note  Patient: Gloria Frazier  Procedure(s) Performed: RIGHT TOTAL HIP ARTHROPLASTY ANTERIOR APPROACH (Right Hip)     Patient location during evaluation: PACU Anesthesia Type: MAC and Spinal Level of consciousness: oriented and awake and alert Pain management: pain level controlled Vital Signs Assessment: post-procedure vital signs reviewed and stable Respiratory status: spontaneous breathing, respiratory function stable and patient connected to nasal cannula oxygen Cardiovascular status: blood pressure returned to baseline and stable Postop Assessment: no headache, no backache and no apparent nausea or vomiting Anesthetic complications: no    Last Vitals:  Vitals:   12/31/17 1435 12/31/17 1511  BP: 104/68 105/67  Pulse: (!) 57 67  Resp: 17 16  Temp: 36.5 C 36.4 C  SpO2: 100% 100%    Last Pain:  Vitals:   12/31/17 1511  TempSrc: Oral  PainSc:                  [place]

## 2017-12-31 NOTE — Anesthesia Preprocedure Evaluation (Signed)
Anesthesia Evaluation  Patient identified by MRN, date of birth, ID band Patient awake    Reviewed: Allergy & Precautions, H&P , NPO status , Patient's Chart, lab work & pertinent test results  Airway Mallampati: II   Neck ROM: full    Dental   Pulmonary neg pulmonary ROS,    breath sounds clear to auscultation       Cardiovascular + Peripheral Vascular Disease   Rhythm:regular Rate:Normal     Neuro/Psych PSYCHIATRIC DISORDERS Anxiety    GI/Hepatic hiatal hernia, GERD  ,  Endo/Other    Renal/GU      Musculoskeletal  (+) Arthritis ,   Abdominal   Peds  Hematology   Anesthesia Other Findings   Reproductive/Obstetrics                             Anesthesia Physical Anesthesia Plan  ASA: II  Anesthesia Plan: MAC and Spinal   Post-op Pain Management:    Induction: Intravenous  PONV Risk Score and Plan: 2 and Ondansetron, Dexamethasone, Propofol infusion, Midazolam and Treatment may vary due to age or medical condition  Airway Management Planned: Simple Face Mask  Additional Equipment:   Intra-op Plan:   Post-operative Plan:   Informed Consent: I have reviewed the patients History and Physical, chart, labs and discussed the procedure including the risks, benefits and alternatives for the proposed anesthesia with the patient or authorized representative who has indicated his/her understanding and acceptance.     Plan Discussed with: CRNA, Surgeon and Anesthesiologist  Anesthesia Plan Comments:         Anesthesia Quick Evaluation
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
## 2017-12-31 NOTE — Evaluation (Signed)
Physical Therapy Evaluation Patient Details Name: Gloria Frazier MRN: [id number] DOB: 1956-03-13 Today's Date: 12/31/2017   History of Present Illness  Pt s/p R THR and with hx of ADHD  Clinical Impression  Pt s/p R THR and presents with decreased R LE strength/ROM and post op pain limiting functional mobility.  Pt should progress to dc home with family assist.    Follow Up Recommendations Follow surgeon's recommendation for DC plan and follow-up therapies    Equipment Recommendations  None recommended by PT    Recommendations for Other Services       Precautions / Restrictions Precautions Precautions: Fall Restrictions Weight Bearing Restrictions: No RLE Weight Bearing: Weight bearing as tolerated      Mobility  Bed Mobility Overal bed mobility: Needs Assistance Bed Mobility: Supine to Sit     Supine to sit: Min assist     General bed mobility comments: cues for sequence  Transfers Overall transfer level: Needs assistance Equipment used: Rolling walker (2 wheeled) Transfers: Sit to/from Stand Sit to Stand: Min assist         General transfer comment: cues for LE managment and use of UEs to self assist  Ambulation/Gait Ambulation/Gait assistance: Min assist Gait Distance (Feet): 90 Feet Assistive device: Rolling walker (2 wheeled) Gait Pattern/deviations: Step-to pattern;Step-through pattern;Decreased step length - right;Decreased step length - left;Shuffle;Trunk flexed Gait velocity: decr   General Gait Details: cues for posture, position from RW and initial sequence  Stairs            Wheelchair Mobility    Modified Rankin (Stroke Patients Only)       Balance Overall balance assessment: No apparent balance deficits (not formally assessed)                                           Pertinent Vitals/Pain Pain Assessment: 0-10 Pain Score: 3  Pain Location: R hip Pain Descriptors / Indicators: Aching;Sore Pain  Intervention(s): Limited activity within patient's tolerance;Monitored during session;Premedicated before session;Ice applied    Home Living Family/patient expects to be discharged to:: Private residence Living Arrangements: Spouse/significant other Available Help at Discharge: Family;Friend(s);Available 24 hours/day Type of Home: House Home Access: Stairs to enter Entrance Stairs-Rails: None Entrance Stairs-Number of Steps: 2+1 Home Layout: Able to live on main level with bedroom/bathroom Home Equipment: Walker - 2 wheels;Cane - single point      Prior Function Level of Independence: Independent;Independent with assistive device(s)         Comments: cane as needed     Hand Dominance        Extremity/Trunk Assessment   Upper Extremity Assessment Upper Extremity Assessment: Overall WFL for tasks assessed    Lower Extremity Assessment Lower Extremity Assessment: RLE deficits/detail    Cervical / Trunk Assessment Cervical / Trunk Assessment: Normal  Communication   Communication: No difficulties  Cognition Arousal/Alertness: Awake/alert Behavior During Therapy: WFL for tasks assessed/performed Overall Cognitive Status: Within Functional Limits for tasks assessed                                        General Comments      Exercises Total Joint Exercises Ankle Circles/Pumps: AROM;Both;15 reps;Supine   Assessment/Plan    PT Assessment Patient needs continued PT services  PT Problem List Decreased  strength;Decreased range of motion;Decreased activity tolerance;Decreased mobility;Decreased knowledge of use of DME;Pain       PT Treatment Interventions DME instruction;Gait training;Stair training;Functional mobility training;Therapeutic activities;Therapeutic exercise;Patient/family education    PT Goals (Current goals can be found in the Care Plan section)  Acute Rehab PT Goals Patient Stated Goal: Regain IND PT Goal Formulation: With  patient Time For Goal Achievement: 01/07/18 Potential to Achieve Goals: Good    Frequency 7X/week   Barriers to discharge        Co-evaluation               AM-PAC PT "6 Clicks" Daily Activity  Outcome Measure Difficulty turning over in bed (including adjusting bedclothes, sheets and blankets)?: A Lot Difficulty moving from lying on back to sitting on the side of the bed? : A Lot Difficulty sitting down on and standing up from a chair with arms (e.g., wheelchair, bedside commode, etc,.)?: A Lot Help needed moving to and from a bed to chair (including a wheelchair)?: A Little Help needed walking in hospital room?: A Little Help needed climbing 3-5 steps with a railing? : A Little 6 Click Score: 15    End of Session   Activity Tolerance: Patient tolerated treatment well Patient left: in chair;with call bell/phone within reach;with family/visitor present Nurse Communication: Mobility status PT Visit Diagnosis: Difficulty in walking, not elsewhere classified (R26.2)    Time: 1633-1700 PT Time Calculation (min) (ACUTE ONLY): 27 min   Charges:   PT Evaluation $PT Eval Low Complexity: 1 Low PT Treatments $Gait Training: 8-22 mins   PT G Codes:        Pg [phone]   Robi Dewolfe 12/31/2017, 5:10 PM

## 2017-12-31 NOTE — Interval H&P Note (Signed)
History and Physical Interval Note:  12/31/2017 8:10 AM  Gloria Frazier  has presented today for surgery, with the diagnosis of Right Hip Osteoarthritis  The various methods of treatment have been discussed with the patient and family. After consideration of risks, benefits and other options for treatment, the patient has consented to  Procedure(s): RIGHT TOTAL HIP ARTHROPLASTY ANTERIOR APPROACH (Right) as a surgical intervention .  The patient's history has been reviewed, patient examined, no change in status, stable for surgery.  I have reviewed the patient's chart and labs.  Questions were answered to the patient's satisfaction.     Pilar Plate Ladon [place]

## 2017-12-31 NOTE — Discharge Instructions (Signed)
Dr. Gaynelle Arabian Total Joint Specialist [facility] [address], [place], [place] [address] [phone]  ANTERIOR APPROACH TOTAL HIP REPLACEMENT POSTOPERATIVE DIRECTIONS   Hip Rehabilitation, Guidelines Following Surgery  The results of a hip operation are greatly improved after range of motion and muscle strengthening exercises. Follow all safety measures which are given to protect your hip. If any of these exercises cause increased pain or swelling in your joint, decrease the amount until you are comfortable again. Then slowly increase the exercises. Call your caregiver if you have problems or questions.   HOME CARE INSTRUCTIONS  Remove items at home which could result in a fall. This includes throw rugs or furniture in walking pathways.   ICE to the affected hip every three hours for 30 minutes at a time and then as needed for pain and swelling.  Continue to use ice on the hip for pain and swelling from surgery. You may notice swelling that will progress down to the foot and ankle.  This is normal after surgery.  Elevate the leg when you are not up walking on it.    Continue to use the breathing machine which will help keep your temperature down.  It is common for your temperature to cycle up and down following surgery, especially at night when you are not up moving around and exerting yourself.  The breathing machine keeps your lungs expanded and your temperature down.   DIET You may resume your previous home diet once your are discharged from the hospital.  DRESSING / WOUND CARE / SHOWERING You may change your dressing every day with sterile gauze.  Please use good hand washing techniques before changing the dressing.  Do not use any lotions or creams on the incision until instructed by your surgeon. You may start showering once you are discharged home but do not submerge the incision under water. Just pat the incision dry and apply a dry gauze dressing on  daily. Change the surgical dressing daily and reapply a dry dressing each time.  ACTIVITY Walk with your walker as instructed. Use walker as long as suggested by your caregivers. Avoid periods of inactivity such as sitting longer than an hour when not asleep. This helps prevent blood clots.  You may resume a sexual relationship in one month or when given the OK by your doctor.  You may return to work once you are cleared by your doctor.  Do not drive a car for 6 weeks or until released by you surgeon.  Do not drive while taking narcotics.  WEIGHT BEARING Weight bearing as tolerated with assist device (walker, cane, etc) as directed, use it as long as suggested by your surgeon or therapist, typically at least 4-6 weeks.  POSTOPERATIVE CONSTIPATION PROTOCOL Constipation - defined medically as fewer than three stools per week and severe constipation as less than one stool per week.  One of the most common issues patients have following surgery is constipation.  Even if you have a regular bowel pattern at home, your normal regimen is likely to be disrupted due to multiple reasons following surgery.  Combination of anesthesia, postoperative narcotics, change in appetite and fluid intake all can affect your bowels.  In order to avoid complications following surgery, here are some recommendations in order to help you during your recovery period.  Colace (docusate) - Pick up an over-the-counter form of Colace or another stool softener and take twice a day as long as you are requiring postoperative pain  medications.  Take with a full glass of water daily.  If you experience loose stools or diarrhea, hold the colace until you stool forms back up.  If your symptoms do not get better within 1 week or if they get worse, check with your doctor.  Dulcolax (bisacodyl) - Pick up over-the-counter and take as directed by the product packaging as needed to assist with the movement of your bowels.  Take with a full  glass of water.  Use this product as needed if not relieved by Colace only.   MiraLax (polyethylene glycol) - Pick up over-the-counter to have on hand.  MiraLax is a solution that will increase the amount of water in your bowels to assist with bowel movements.  Take as directed and can mix with a glass of water, juice, soda, coffee, or tea.  Take if you go more than two days without a movement. Do not use MiraLax more than once per day. Call your doctor if you are still constipated or irregular after using this medication for 7 days in a row.  If you continue to have problems with postoperative constipation, please contact the office for further assistance and recommendations.  If you experience "the worst abdominal pain ever" or develop nausea or vomiting, please contact the office immediatly for further recommendations for treatment.  ITCHING  If you experience itching with your medications, try taking only a single pain pill, or even half a pain pill at a time.  You can also use Benadryl over the counter for itching or also to help with sleep.   TED HOSE STOCKINGS Wear the elastic stockings on both legs for three weeks following surgery during the day but you may remove then at night for sleeping.  MEDICATIONS See your medication summary on the After Visit Summary that the nursing staff will review with you prior to discharge.  You may have some home medications which will be placed on hold until you complete the course of blood thinner medication.  It is important for you to complete the blood thinner medication as prescribed by your surgeon.  Continue your approved medications as instructed at time of discharge.  PRECAUTIONS If you experience chest pain or shortness of breath - call 911 immediately for transfer to the hospital emergency department.  If you develop a fever greater that 101 F, purulent drainage from wound, increased redness or drainage from wound, foul odor from the  wound/dressing, or calf pain - CONTACT YOUR SURGEON.                                                   FOLLOW-UP APPOINTMENTS Make sure you keep all of your appointments after your operation with your surgeon and caregivers. You should call the office at the above phone number and make an appointment for approximately two weeks after the date of your surgery or on the date instructed by your surgeon outlined in the "After Visit Summary".  RANGE OF MOTION AND STRENGTHENING EXERCISES  These exercises are designed to help you keep full movement of your hip joint. Follow your caregiver's or physical therapist's instructions. Perform all exercises about fifteen times, three times per day or as directed. Exercise both hips, even if you have had only one joint replacement. These exercises can be done on a training (exercise) mat, on the floor, on  a table or on a bed. Use whatever works the best and is most comfortable for you. Use music or television while you are exercising so that the exercises are a pleasant break in your day. This will make your life better with the exercises acting as a break in routine you can look forward to.  Lying on your back, slowly slide your foot toward your buttocks, raising your knee up off the floor. Then slowly slide your foot back down until your leg is straight again.  Lying on your back spread your legs as far apart as you can without causing discomfort.  Lying on your side, raise your upper leg and foot straight up from the floor as far as is comfortable. Slowly lower the leg and repeat.  Lying on your back, tighten up the muscle in the front of your thigh (quadriceps muscles). You can do this by keeping your leg straight and trying to raise your heel off the floor. This helps strengthen the largest muscle supporting your knee.  Lying on your back, tighten up the muscles of your buttocks both with the legs straight and with the knee bent at a comfortable angle while keeping  your heel on the floor.   IF YOU ARE TRANSFERRED TO A SKILLED REHAB FACILITY If the patient is transferred to a skilled rehab facility following release from the hospital, a list of the current medications will be sent to the facility for the patient to continue.  When discharged from the skilled rehab facility, please have the facility set up the patient's [place] prior to being released. Also, the skilled facility will be responsible for providing the patient with their medications at time of release from the facility to include their pain medication, the muscle relaxants, and their blood thinner medication. If the patient is still at the rehab facility at time of the two week follow up appointment, the skilled rehab facility will also need to assist the patient in arranging follow up appointment in our office and any transportation needs.  MAKE SURE YOU:  Understand these instructions.  Get help right away if you are not doing well or get worse.    Pick up stool softner and laxative for home use following surgery while on pain medications. Do not submerge incision under water. Please use good hand washing techniques while changing dressing each day. May shower starting three days after surgery. Please use a clean towel to pat the incision dry following showers. Continue to use ice for pain and swelling after surgery. Do not use any lotions or creams on the incision until instructed by your surgeon.

## 2018-01-01 ENCOUNTER — Encounter (HOSPITAL_COMMUNITY): Payer: Self-pay | Admitting: Orthopedic Surgery

## 2018-01-01 LAB — BASIC METABOLIC PANEL
ANION GAP: 7 (ref 5–15)
BUN: 9 mg/dL (ref 8–23)
CALCIUM: 8.5 mg/dL — AB (ref 8.9–10.3)
CO2: 26 mmol/L (ref 22–32)
Chloride: 108 mmol/L (ref 98–111)
Creatinine, Ser: 0.67 mg/dL (ref 0.44–1.00)
GFR calc Af Amer: >60 mL/min (ref >60)
GFR calc non Af Amer: >60 mL/min (ref >60)
GLUCOSE: 100 mg/dL — AB (ref 70–99)
Potassium: 3.4 mmol/L — ABNORMAL LOW (ref 3.5–5.1)
Sodium: 141 mmol/L (ref 135–145)

## 2018-01-01 LAB — CBC
HCT: 33.5 % — ABNORMAL LOW (ref 36.0–46.0)
Hemoglobin: 11.1 g/dL — ABNORMAL LOW (ref 12.0–15.0)
MCH: 30.2 pg (ref 26.0–34.0)
MCHC: 33.1 g/dL (ref 30.0–36.0)
MCV: 91.3 fL (ref 78.0–100.0)
Platelets: 271 10*3/uL (ref 150–400)
RBC: 3.67 MIL/uL — AB (ref 3.87–5.11)
RDW: 11.6 % (ref 11.5–15.5)
WBC: 15.6 10*3/uL — ABNORMAL HIGH (ref 4.0–10.5)

## 2018-01-01 MED ORDER — HYDROCODONE-ACETAMINOPHEN 5-325 MG PO TABS: 1.0000 | ORAL_TABLET | Freq: Four times a day (QID) | ORAL | 0 refills | Status: DC | PRN

## 2018-01-01 MED ORDER — POTASSIUM CHLORIDE CRYS ER 20 MEQ PO TBCR
40.0000 meq | EXTENDED_RELEASE_TABLET | Freq: Two times a day (BID) | ORAL | Status: DC
  Administered 2018-01-01: 40 meq via ORAL
  Filled 2018-01-01: qty 2

## 2018-01-01 MED ORDER — TRAMADOL HCL 50 MG PO TABS: 50.0000 mg | ORAL_TABLET | Freq: Four times a day (QID) | ORAL | 0 refills | Status: DC | PRN

## 2018-01-01 MED ORDER — METHOCARBAMOL 500 MG PO TABS: 500.0000 mg | ORAL_TABLET | Freq: Four times a day (QID) | ORAL | 0 refills | Status: DC | PRN

## 2018-01-01 MED ORDER — ASPIRIN 325 MG PO TBEC: 325.0000 mg | DELAYED_RELEASE_TABLET | Freq: Two times a day (BID) | ORAL | 0 refills | Status: AC

## 2018-01-01 NOTE — Progress Notes (Signed)
Discharge planning, spoke with patient at bedside. Have chosen [facility], evaluate and treat. Contacted [facility] for referral. Has DME. [phone]

## 2018-01-01 NOTE — Progress Notes (Signed)
RN reviewed instructions with patient and family. All questions answered.   Paperwork and prescriptions given.   NT rolled patient down with all belongings to family car.

## 2018-01-01 NOTE — Progress Notes (Signed)
Physical Therapy Treatment Patient Details Name: Gloria Frazier MRN: [id number] DOB: 02-18-1956 Today's Date: 01/01/2018    History of Present Illness Pt s/p R THR and with hx of ADHD    PT Comments    Pt motivated and progressing well with mobility.  Spouse present to review home therex, stairs, and car transfers.   Follow Up Recommendations  Follow surgeon's recommendation for DC plan and follow-up therapies     Equipment Recommendations  None recommended by PT    Recommendations for Other Services       Precautions / Restrictions Precautions Precautions: Fall Restrictions Weight Bearing Restrictions: No RLE Weight Bearing: Weight bearing as tolerated    Mobility  Bed Mobility               General bed mobility comments: Up in chair and requests back to same  Transfers Overall transfer level: Needs assistance Equipment used: Rolling walker (2 wheeled) Transfers: Sit to/from Stand Sit to Stand: Supervision         General transfer comment: min cues for LE managment and use of UEs to self assist  Ambulation/Gait Ambulation/Gait assistance: Min guard;Supervision Gait Distance (Feet): 240 Feet(and 15' twice to/from bathroom) Assistive device: Rolling walker (2 wheeled) Gait Pattern/deviations: Step-to pattern;Step-through pattern;Decreased step length - right;Decreased step length - left;Shuffle;Trunk flexed Gait velocity: decr   General Gait Details: cues for posture, position from RW and ER on R   Stairs Stairs: Yes Stairs assistance: Min assist Stair Management: No rails;Step to pattern;Backwards;Forwards;With walker Number of Stairs: 3 General stair comments: single step twice fwd and once bkwd   Wheelchair Mobility    Modified Rankin (Stroke Patients Only)       Balance Overall balance assessment: No apparent balance deficits (not formally assessed)                                          Cognition  Arousal/Alertness: Awake/alert Behavior During Therapy: WFL for tasks assessed/performed Overall Cognitive Status: Within Functional Limits for tasks assessed                                        Exercises      General Comments        Pertinent Vitals/Pain Pain Assessment: 0-10 Pain Score: 4  Pain Location: R hip Pain Descriptors / Indicators: Aching;Sore Pain Intervention(s): Limited activity within patient's tolerance;Monitored during session;Premedicated before session    Home Living                      Prior Function            PT Goals (current goals can now be found in the care plan section) Acute Rehab PT Goals Patient Stated Goal: Regain IND PT Goal Formulation: With patient Time For Goal Achievement: 01/07/18 Potential to Achieve Goals: Good Progress towards PT goals: Progressing toward goals    Frequency    7X/week      PT Plan Current plan remains appropriate    Co-evaluation              AM-PAC PT "6 Clicks" Daily Activity  Outcome Measure  Difficulty turning over in bed (including adjusting bedclothes, sheets and blankets)?: A Little Difficulty moving from lying on back to sitting on the  side of the bed? : A Little Difficulty sitting down on and standing up from a chair with arms (e.g., wheelchair, bedside commode, etc,.)?: A Little Help needed moving to and from a bed to chair (including a wheelchair)?: A Little Help needed walking in hospital room?: A Little Help needed climbing 3-5 steps with a railing? : A Little 6 Click Score: 18    End of Session Equipment Utilized During Treatment: Gait belt Activity Tolerance: Patient tolerated treatment well Patient left: in chair;with call bell/phone within reach;with family/visitor present Nurse Communication: Mobility status PT Visit Diagnosis: Difficulty in walking, not elsewhere classified (R26.2)     Time: 4627-0350 PT Time Calculation (min) (ACUTE ONLY):  32 min  Charges:  $Gait Training: 8-22 mins $Therapeutic Activity: 8-22 mins                    G Codes:       [id number] [phone]    Zeina Akkerman 01/01/2018, 12:06 PM
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
## 2018-01-01 NOTE — Progress Notes (Signed)
Physical Therapy Treatment Patient Details Name: Gloria Frazier MRN: [id number] DOB: [birth year] Today's Date: 01/01/2018    History of Present Illness Pt s/p R THR and with hx of ADHD    PT Comments    Pt motivated and progressing well with mobility.   Follow Up Recommendations  Follow surgeon's recommendation for DC plan and follow-up therapies     Equipment Recommendations  None recommended by PT    Recommendations for Other Services       Precautions / Restrictions Precautions Precautions: Fall Restrictions Weight Bearing Restrictions: No RLE Weight Bearing: Weight bearing as tolerated    Mobility  Bed Mobility Overal bed mobility: Needs Assistance Bed Mobility: Supine to Sit     Supine to sit: Min guard     General bed mobility comments: cues for sequence  Transfers Overall transfer level: Needs assistance Equipment used: Rolling walker (2 wheeled) Transfers: Sit to/from Stand Sit to Stand: Min guard         General transfer comment: min cues for LE managment and use of UEs to self assist  Ambulation/Gait Ambulation/Gait assistance: Min assist;Min guard Gait Distance (Feet): 240 Feet(and 15' back from bathroom) Assistive device: Rolling walker (2 wheeled) Gait Pattern/deviations: Step-to pattern;Step-through pattern;Decreased step length - right;Decreased step length - left;Shuffle;Trunk flexed Gait velocity: decr   General Gait Details: cues for posture, position from RW and ER on R   Stairs             Wheelchair Mobility    Modified Rankin (Stroke Patients Only)       Balance Overall balance assessment: No apparent balance deficits (not formally assessed)                                          Cognition Arousal/Alertness: Awake/alert Behavior During Therapy: WFL for tasks assessed/performed Overall Cognitive Status: Within Functional Limits for tasks assessed                                         Exercises Total Joint Exercises Ankle Circles/Pumps: AROM;Both;15 reps;Supine Quad Sets: AROM;Both;10 reps;Supine Heel Slides: AAROM;Right;20 reps;Supine Hip ABduction/ADduction: AAROM;Right;15 reps;Supine Long Arc Quad: AROM;Right;10 reps;Seated    General Comments        Pertinent Vitals/Pain Pain Assessment: 0-10 Pain Score: 4  Pain Location: R hip Pain Descriptors / Indicators: Aching;Sore Pain Intervention(s): Limited activity within patient's tolerance;Monitored during session;Premedicated before session;Ice applied    Home Living                      Prior Function            PT Goals (current goals can now be found in the care plan section) Acute Rehab PT Goals Patient Stated Goal: Regain IND PT Goal Formulation: With patient Time For Goal Achievement: 01/07/18 Potential to Achieve Goals: Good Progress towards PT goals: Progressing toward goals    Frequency    7X/week      PT Plan Current plan remains appropriate    Co-evaluation              AM-PAC PT "6 Clicks" Daily Activity  Outcome Measure  Difficulty turning over in bed (including adjusting bedclothes, sheets and blankets)?: A Little Difficulty moving from lying on back to sitting on  the side of the bed? : A Little Difficulty sitting down on and standing up from a chair with arms (e.g., wheelchair, bedside commode, etc,.)?: A Little Help needed moving to and from a bed to chair (including a wheelchair)?: A Little Help needed walking in hospital room?: A Little Help needed climbing 3-5 steps with a railing? : A Little 6 Click Score: 18    End of Session Equipment Utilized During Treatment: Gait belt Activity Tolerance: Patient tolerated treatment well Patient left: in chair;with call bell/phone within reach;with family/visitor present Nurse Communication: Mobility status PT Visit Diagnosis: Difficulty in walking, not elsewhere classified (R26.2)     Time:  5681-2751 PT Time Calculation (min) (ACUTE ONLY): 35 min  Charges:  $Gait Training: 8-22 mins $Therapeutic Exercise: 8-22 mins                    G Codes:       [id number] [phone]    Domino Holten 01/01/2018, 8:48 AM
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
## 2018-01-03 DIAGNOSIS — M1612 Unilateral primary osteoarthritis, left hip: Secondary | ICD-10-CM | POA: Diagnosis not present

## 2018-01-03 DIAGNOSIS — M19029 Primary osteoarthritis, unspecified elbow: Secondary | ICD-10-CM | POA: Diagnosis not present

## 2018-01-03 DIAGNOSIS — Z471 Aftercare following joint replacement surgery: Secondary | ICD-10-CM | POA: Diagnosis not present

## 2018-01-05 DIAGNOSIS — M19029 Primary osteoarthritis, unspecified elbow: Secondary | ICD-10-CM | POA: Diagnosis not present

## 2018-01-05 DIAGNOSIS — M1612 Unilateral primary osteoarthritis, left hip: Secondary | ICD-10-CM | POA: Diagnosis not present

## 2018-01-05 DIAGNOSIS — Z471 Aftercare following joint replacement surgery: Secondary | ICD-10-CM | POA: Diagnosis not present

## 2018-01-06 DIAGNOSIS — M19029 Primary osteoarthritis, unspecified elbow: Secondary | ICD-10-CM | POA: Diagnosis not present

## 2018-01-06 DIAGNOSIS — M1612 Unilateral primary osteoarthritis, left hip: Secondary | ICD-10-CM | POA: Diagnosis not present

## 2018-01-06 DIAGNOSIS — Z471 Aftercare following joint replacement surgery: Secondary | ICD-10-CM | POA: Diagnosis not present

## 2018-01-08 DIAGNOSIS — M19029 Primary osteoarthritis, unspecified elbow: Secondary | ICD-10-CM | POA: Diagnosis not present

## 2018-01-08 DIAGNOSIS — M1612 Unilateral primary osteoarthritis, left hip: Secondary | ICD-10-CM | POA: Diagnosis not present

## 2018-01-08 DIAGNOSIS — Z471 Aftercare following joint replacement surgery: Secondary | ICD-10-CM | POA: Diagnosis not present

## 2018-01-21 DIAGNOSIS — M1612 Unilateral primary osteoarthritis, left hip: Secondary | ICD-10-CM | POA: Diagnosis not present

## 2018-01-21 DIAGNOSIS — Z471 Aftercare following joint replacement surgery: Secondary | ICD-10-CM | POA: Diagnosis not present

## 2018-01-21 DIAGNOSIS — M19029 Primary osteoarthritis, unspecified elbow: Secondary | ICD-10-CM | POA: Diagnosis not present

## 2018-02-03 DIAGNOSIS — Z96641 Presence of right artificial hip joint: Secondary | ICD-10-CM | POA: Diagnosis not present
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
## 2018-02-17 ENCOUNTER — Ambulatory Visit: Payer: 59 | Admitting: Physician Assistant

## 2018-02-17 ENCOUNTER — Encounter: Payer: Self-pay | Admitting: Physician Assistant

## 2018-02-17 VITALS — BP 126/69 | HR 59 | Resp 16 | Ht 65.0 in | Wt 150.4 lb

## 2018-02-17 DIAGNOSIS — M1612 Unilateral primary osteoarthritis, left hip: Secondary | ICD-10-CM | POA: Diagnosis not present

## 2018-02-17 DIAGNOSIS — M19042 Primary osteoarthritis, left hand: Secondary | ICD-10-CM

## 2018-02-17 DIAGNOSIS — M7711 Lateral epicondylitis, right elbow: Secondary | ICD-10-CM

## 2018-02-17 DIAGNOSIS — M19041 Primary osteoarthritis, right hand: Secondary | ICD-10-CM | POA: Diagnosis not present

## 2018-02-17 DIAGNOSIS — M2141 Flat foot [pes planus] (acquired), right foot: Secondary | ICD-10-CM

## 2018-02-17 DIAGNOSIS — Z96641 Presence of right artificial hip joint: Secondary | ICD-10-CM

## 2018-02-17 DIAGNOSIS — M2142 Flat foot [pes planus] (acquired), left foot: Secondary | ICD-10-CM

## 2018-02-17 DIAGNOSIS — M21619 Bunion of unspecified foot: Secondary | ICD-10-CM

## 2018-02-17 DIAGNOSIS — M2480 Other specific joint derangements of unspecified joint, not elsewhere classified: Secondary | ICD-10-CM | POA: Diagnosis not present

## 2018-02-17 MED ORDER — MELOXICAM 7.5 MG PO TABS: ORAL_TABLET | ORAL | 2 refills | Status: DC

## 2018-02-17 NOTE — Patient Instructions (Signed)
Natural anti-inflammatories  You can purchase these at [employer], [employer] or online.  . Turmeric (capsules)  . Ginger (ginger root or capsules)  . Omega 3 (Fish, flax seeds, chia seeds, walnuts, almonds)  . Tart cherry (dried or extract)   Patient should be under the care of a physician while taking these supplements. This may not be reproduced without the permission of Dr. Shaili Deveshwar.  

## 2018-04-21 ENCOUNTER — Ambulatory Visit (INDEPENDENT_AMBULATORY_CARE_PROVIDER_SITE_OTHER): Payer: 59 | Admitting: Behavioral Health

## 2018-04-21 DIAGNOSIS — Z23 Encounter for immunization: Secondary | ICD-10-CM

## 2018-04-21 NOTE — Progress Notes (Signed)
Pt came into clinic to receive an Influenza Vaccination. Pt received vaccination in left arm which is documented. Pt tolerated vaccination well, no signs or symptoms after vaccination.

## 2018-06-24 DIAGNOSIS — C50919 Malignant neoplasm of unspecified site of unspecified female breast: Secondary | ICD-10-CM

## 2018-06-24 DIAGNOSIS — Z923 Personal history of irradiation: Secondary | ICD-10-CM

## 2018-06-24 HISTORY — DX: Malignant neoplasm of unspecified site of unspecified female breast: C50.919

## 2018-06-24 HISTORY — DX: Personal history of irradiation: Z92.3

## 2018-07-03 ENCOUNTER — Encounter: Payer: Self-pay | Admitting: General Practice

## 2018-07-03 ENCOUNTER — Ambulatory Visit (INDEPENDENT_AMBULATORY_CARE_PROVIDER_SITE_OTHER): Payer: 59 | Admitting: Family Medicine

## 2018-07-03 ENCOUNTER — Other Ambulatory Visit: Payer: Self-pay

## 2018-07-03 ENCOUNTER — Encounter: Payer: Self-pay | Admitting: Family Medicine

## 2018-07-03 VITALS — BP 122/78 | HR 72 | Temp 98.2°F | Resp 16 | Ht 65.0 in | Wt 152.1 lb

## 2018-07-03 DIAGNOSIS — Z0184 Encounter for antibody response examination: Secondary | ICD-10-CM | POA: Diagnosis not present

## 2018-07-03 DIAGNOSIS — E663 Overweight: Secondary | ICD-10-CM

## 2018-07-03 DIAGNOSIS — Z0000 Encounter for general adult medical examination without abnormal findings: Secondary | ICD-10-CM

## 2018-07-03 DIAGNOSIS — Z23 Encounter for immunization: Secondary | ICD-10-CM

## 2018-07-03 LAB — HEPATIC FUNCTION PANEL
ALBUMIN: 4.1 g/dL (ref 3.5–5.2)
ALT: 27 U/L (ref 0–35)
AST: 27 U/L (ref 0–37)
Alkaline Phosphatase: 61 U/L (ref 39–117)
BILIRUBIN TOTAL: 0.5 mg/dL (ref 0.2–1.2)
Bilirubin, Direct: 0.1 mg/dL (ref 0.0–0.3)
TOTAL PROTEIN: 6.7 g/dL (ref 6.0–8.3)

## 2018-07-03 LAB — LIPID PANEL
CHOL/HDL RATIO: 2
Cholesterol: 188 mg/dL (ref 0–200)
HDL: 86.1 mg/dL (ref >39.00)
LDL CALC: 79 mg/dL (ref 0–99)
NONHDL: 102.24
Triglycerides: 114 mg/dL (ref 0.0–149.0)
VLDL: 22.8 mg/dL (ref 0.0–40.0)

## 2018-07-03 LAB — CBC WITH DIFFERENTIAL/PLATELET
BASOS ABS: 0.1 10*3/uL (ref 0.0–0.1)
Basophils Relative: 0.8 % (ref 0.0–3.0)
EOS ABS: 0.2 10*3/uL (ref 0.0–0.7)
EOS PCT: 2.2 % (ref 0.0–5.0)
HCT: 39.7 % (ref 36.0–46.0)
HEMOGLOBIN: 13.2 g/dL (ref 12.0–15.0)
Lymphocytes Relative: 17 % (ref 12.0–46.0)
Lymphs Abs: 1.2 10*3/uL (ref 0.7–4.0)
MCHC: 33.3 g/dL (ref 30.0–36.0)
MCV: 92 fl (ref 78.0–100.0)
Monocytes Absolute: 0.5 10*3/uL (ref 0.1–1.0)
Monocytes Relative: 7 % (ref 3.0–12.0)
Neutro Abs: 5.3 10*3/uL (ref 1.4–7.7)
Neutrophils Relative %: 73 % (ref 43.0–77.0)
Platelets: 302 10*3/uL (ref 150.0–400.0)
RBC: 4.31 Mil/uL (ref 3.87–5.11)
RDW: 12.6 % (ref 11.5–15.5)
WBC: 7.3 10*3/uL (ref 4.0–10.5)

## 2018-07-03 LAB — BASIC METABOLIC PANEL
BUN: 12 mg/dL (ref 6–23)
CALCIUM: 9.3 mg/dL (ref 8.4–10.5)
CO2: 28 mEq/L (ref 19–32)
Chloride: 103 mEq/L (ref 96–112)
Creatinine, Ser: 0.81 mg/dL (ref 0.40–1.20)
GFR: 75.67 mL/min (ref >60.00)
Glucose, Bld: 99 mg/dL (ref 70–99)
Potassium: 3.9 mEq/L (ref 3.5–5.1)
Sodium: 139 mEq/L (ref 135–145)

## 2018-07-03 LAB — TSH: TSH: 1.79 u[IU]/mL (ref 0.35–4.50)

## 2018-07-03 MED ORDER — METHYLPHENIDATE HCL ER (OSM) 18 MG PO TBCR: 18.0000 mg | EXTENDED_RELEASE_TABLET | Freq: Every day | ORAL | 0 refills | Status: DC

## 2018-07-03 MED ORDER — METHYLPHENIDATE HCL 10 MG PO TABS: ORAL_TABLET | ORAL | 0 refills | Status: DC

## 2018-07-03 NOTE — Patient Instructions (Addendum)
Follow up in 1 year or as needed Schedule a nurse visit for your 2nd Shingrix in 2-4 months We'll notify you of your lab results and make any changes if needed Continue to work on healthy diet and regular exercise- you can do it! Call with any questions or concerns Happy New Year!!

## 2018-07-03 NOTE — Addendum Note (Signed)
Addended by: Davis Gourd on: 07/03/2018 08:42 AM   Modules accepted: Orders

## 2018-07-03 NOTE — Assessment & Plan Note (Signed)
Pt's PE WNL.  UTD on pap, mammo, colonoscopy, Tdap, flu.  Pt desires Shingrix today- first shot given.  Check labs.  Anticipatory guidance provided.

## 2018-07-03 NOTE — Progress Notes (Signed)
   Subjective:    Patient ID: Gloria Frazier, female    DOB: 04-05-1956, 63 y.o.   MRN: [id number]  HPI CPE- UTD on pap, mammo, immunizations.   Review of Systems Patient reports no vision/ hearing changes, adenopathy,fever, weight change,  persistant/recurrent hoarseness , swallowing issues, chest pain, palpitations, edema, persistant/recurrent cough, hemoptysis, dyspnea (rest/exertional/paroxysmal nocturnal), gastrointestinal bleeding (melena, rectal bleeding), abdominal pain, significant heartburn, bowel changes, GU symptoms (dysuria, hematuria, incontinence), Gyn symptoms (abnormal  bleeding, pain),  syncope, focal weakness, memory loss, numbness & tingling, skin/hair/nail changes, abnormal bruising or bleeding, anxiety, or depression.     Objective:   Physical Exam General Appearance:    Alert, cooperative, no distress, appears stated age  Head:    Normocephalic, without obvious abnormality, atraumatic  Eyes:    PERRL, conjunctiva/corneas clear, EOM's intact, fundi    benign, both eyes  Ears:    Normal TM's and external ear canals, both ears  Nose:   Nares normal, septum midline, mucosa normal, no drainage    or sinus tenderness  Throat:   Lips, mucosa, and tongue normal; teeth and gums normal  Neck:   Supple, symmetrical, trachea midline, no adenopathy;    Thyroid: no enlargement/tenderness/nodules  Back:     Symmetric, no curvature, ROM normal, no CVA tenderness  Lungs:     Clear to auscultation bilaterally, respirations unlabored  Chest Wall:    No tenderness or deformity   Heart:    Regular rate and rhythm, S1 and S2 normal, no murmur, rub   or gallop  Breast Exam:    Deferred to GYN  Abdomen:     Soft, non-tender, bowel sounds active all four quadrants,    no masses, no organomegaly  Genitalia:    Deferred to GYN  Rectal:    Extremities:   Extremities normal, atraumatic, no cyanosis or edema  Pulses:   2+ and symmetric all extremities  Skin:   Skin color, texture, turgor  normal, no rashes or lesions  Lymph nodes:   Cervical, supraclavicular, and axillary nodes normal  Neurologic:   CNII-XII intact, normal strength, sensation and reflexes    throughout          Assessment & Plan:

## 2018-07-06 LAB — MEASLES/MUMPS/RUBELLA IMMUNITY
Mumps IgG: 174 AU/mL
Rubeola IgG: >300 AU/mL

## 2018-07-10 DIAGNOSIS — Z96641 Presence of right artificial hip joint: Secondary | ICD-10-CM | POA: Diagnosis not present

## 2018-07-10 DIAGNOSIS — Z471 Aftercare following joint replacement surgery: Secondary | ICD-10-CM | POA: Diagnosis not present

## 2018-07-17 ENCOUNTER — Encounter: Payer: Self-pay | Admitting: Family Medicine

## 2018-07-17 ENCOUNTER — Ambulatory Visit: Payer: 59 | Admitting: Family Medicine

## 2018-07-17 ENCOUNTER — Other Ambulatory Visit: Payer: Self-pay

## 2018-07-17 VITALS — BP 110/82 | HR 78 | Temp 98.2°F | Resp 16 | Ht 65.0 in | Wt 149.4 lb

## 2018-07-17 DIAGNOSIS — R319 Hematuria, unspecified: Secondary | ICD-10-CM | POA: Diagnosis not present

## 2018-07-17 DIAGNOSIS — R3915 Urgency of urination: Secondary | ICD-10-CM | POA: Diagnosis not present

## 2018-07-17 DIAGNOSIS — R82998 Other abnormal findings in urine: Secondary | ICD-10-CM | POA: Diagnosis not present

## 2018-07-17 LAB — POCT URINALYSIS DIPSTICK
Glucose, UA: NEGATIVE
NITRITE UA: NEGATIVE
PROTEIN UA: POSITIVE — AB
Urobilinogen, UA: 0.2 E.U./dL
pH, UA: 7.5 (ref 5.0–8.0)

## 2018-07-17 NOTE — Addendum Note (Signed)
Addended by: Lynnea Ferrier on: 07/17/2018 02:27 PM   Modules accepted: Orders

## 2018-07-17 NOTE — Progress Notes (Signed)
   Subjective:    Patient ID: Gloria Frazier, female    DOB: Aug 26, 1955, 63 y.o.   MRN: [id number]  HPI Urinary urgency- sxs started 2 days ago w/ 'continual urge to urinate'.  No dysuria, no back pain.  Some suprapubic pressure.  Pt woke up multiple times during the night.  No fevers/chills.   Review of Systems For ROS see HPI     Objective:   Physical Exam Vitals signs reviewed.  Constitutional:      General: She is not in acute distress.    Appearance: She is well-developed.  Abdominal:     General: There is no distension.     Palpations: Abdomen is soft.     Tenderness: There is no abdominal tenderness (no suprapubic or CVA tenderness).  Neurological:     General: No focal deficit present.     Mental Status: She is oriented to person, place, and time.  Psychiatric:        Mood and Affect: Mood normal.        Behavior: Behavior normal.        Thought Content: Thought content normal.           Assessment & Plan:  Urinary urgency- new.  Pt's sxs were better yesterday and today than they were on the first day.  UA is not highly suspicious for infxn but will send for culture to be sure.  No abx until cx results available.  Pt expressed understanding and is in agreement w/ plan.

## 2018-07-17 NOTE — Patient Instructions (Signed)
Follow up as needed or as scheduled We'll notify you of your culture results and start antibiotics if needed Drink LOTS of water! Call with any questions or concerns Have a great weekend!

## 2018-07-18 LAB — URINE CULTURE
MICRO NUMBER: 102496
RESULT: NO GROWTH
SPECIMEN QUALITY:: ADEQUATE
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
## 2018-08-20 ENCOUNTER — Ambulatory Visit (INDEPENDENT_AMBULATORY_CARE_PROVIDER_SITE_OTHER): Payer: Self-pay

## 2018-08-20 ENCOUNTER — Ambulatory Visit: Payer: 59 | Admitting: Physician Assistant

## 2018-08-20 ENCOUNTER — Encounter: Payer: Self-pay | Admitting: Physician Assistant

## 2018-08-20 VITALS — BP 118/70 | HR 60 | Resp 13 | Ht 65.0 in | Wt 151.4 lb

## 2018-08-20 DIAGNOSIS — M21619 Bunion of unspecified foot: Secondary | ICD-10-CM

## 2018-08-20 DIAGNOSIS — M2480 Other specific joint derangements of unspecified joint, not elsewhere classified: Secondary | ICD-10-CM | POA: Diagnosis not present

## 2018-08-20 DIAGNOSIS — Z96641 Presence of right artificial hip joint: Secondary | ICD-10-CM

## 2018-08-20 DIAGNOSIS — M79672 Pain in left foot: Secondary | ICD-10-CM

## 2018-08-20 DIAGNOSIS — M19041 Primary osteoarthritis, right hand: Secondary | ICD-10-CM | POA: Diagnosis not present

## 2018-08-20 DIAGNOSIS — M2141 Flat foot [pes planus] (acquired), right foot: Secondary | ICD-10-CM

## 2018-08-20 DIAGNOSIS — M1612 Unilateral primary osteoarthritis, left hip: Secondary | ICD-10-CM | POA: Diagnosis not present

## 2018-08-20 DIAGNOSIS — M7711 Lateral epicondylitis, right elbow: Secondary | ICD-10-CM

## 2018-08-20 DIAGNOSIS — M2142 Flat foot [pes planus] (acquired), left foot: Secondary | ICD-10-CM

## 2018-08-20 DIAGNOSIS — M19042 Primary osteoarthritis, left hand: Secondary | ICD-10-CM

## 2018-08-20 MED ORDER — DICLOFENAC SODIUM 1 % TD GEL: TRANSDERMAL | 2 refills | Status: AC

## 2018-08-20 MED ORDER — MELOXICAM 7.5 MG PO TABS
ORAL_TABLET | ORAL | 2 refills | Status: DC
Start: 2018-08-20 — End: 2019-02-18

## 2018-09-09 ENCOUNTER — Telehealth: Payer: Self-pay | Admitting: Family Medicine

## 2018-09-09 NOTE — Telephone Encounter (Signed)
Last OV 07/17/18 Ritalin last filled 07/03/18 #30 with 0

## 2018-09-09 NOTE — Telephone Encounter (Signed)
Copied from [place] [id number]. Topic: Quick Communication - Rx Refill/Question >> Sep 09, 2018  8:31 AM Burchel, Abbi R wrote: Medication:  methylphenidate (RITALIN) 10 MG tablet,  methylphenidate 18 MG PO CR tablet   Preferred Pharmacy: [id number] - [place], [place] [address] [place] [address] Phone: [phone] Fax: [phone]    Pt was  advised that RX refills may take up to 3 business days. We ask that you follow-up with your pharmacy.

## 2018-09-10 MED ORDER — METHYLPHENIDATE HCL ER (OSM) 18 MG PO TBCR: 18.0000 mg | EXTENDED_RELEASE_TABLET | Freq: Every day | ORAL | 0 refills | Status: DC

## 2018-09-10 NOTE — Telephone Encounter (Signed)
Prescription filled.

## 2018-09-15 ENCOUNTER — Other Ambulatory Visit: Payer: Self-pay | Admitting: Family Medicine

## 2018-09-15 NOTE — Telephone Encounter (Signed)
Last refill:07/03/18 #30, 0 Last OV:07/03/18

## 2018-09-15 NOTE — Telephone Encounter (Signed)
Last filled 07-03-2018 and takes this in addition to the 18mg  that was filled on 09/10/2018.

## 2018-09-16 MED ORDER — METHYLPHENIDATE HCL 10 MG PO TABS: ORAL_TABLET | ORAL | 0 refills | Status: DC

## 2018-10-01 ENCOUNTER — Ambulatory Visit: Payer: 59

## 2018-10-02 ENCOUNTER — Ambulatory Visit: Payer: 59

## 2018-10-06 ENCOUNTER — Ambulatory Visit (INDEPENDENT_AMBULATORY_CARE_PROVIDER_SITE_OTHER): Payer: 59

## 2018-10-06 DIAGNOSIS — Z23 Encounter for immunization: Secondary | ICD-10-CM | POA: Diagnosis not present

## 2018-10-06 NOTE — Progress Notes (Addendum)
Administered 2nd shingles vaccine IM left arm. Patient tolerated well.   Above order is mine.  Annye Asa, MD

## 2018-10-06 NOTE — Addendum Note (Signed)
Addended by: Midge Minium on: 10/06/2018 09:07 AM   Modules accepted: Level of Service

## 2018-10-28 ENCOUNTER — Other Ambulatory Visit: Payer: Self-pay | Admitting: Obstetrics & Gynecology

## 2018-10-28 DIAGNOSIS — Z1231 Encounter for screening mammogram for malignant neoplasm of breast: Secondary | ICD-10-CM

## 2018-12-29 ENCOUNTER — Ambulatory Visit
Admission: RE | Admit: 2018-12-29 | Discharge: 2018-12-29 | Disposition: A | Discharge status: Home / Self Care | Payer: 59 | Source: Ambulatory Visit | Attending: Obstetrics & Gynecology | Admitting: Obstetrics & Gynecology

## 2018-12-29 ENCOUNTER — Other Ambulatory Visit: Payer: Self-pay

## 2018-12-29 DIAGNOSIS — Z1231 Encounter for screening mammogram for malignant neoplasm of breast: Secondary | ICD-10-CM

## 2018-12-30 ENCOUNTER — Other Ambulatory Visit: Payer: Self-pay | Admitting: Obstetrics & Gynecology

## 2018-12-30 DIAGNOSIS — R928 Other abnormal and inconclusive findings on diagnostic imaging of breast: Secondary | ICD-10-CM

## 2019-01-04 LAB — HM PAP SMEAR

## 2019-01-06 ENCOUNTER — Ambulatory Visit
Admission: RE | Admit: 2019-01-06 | Discharge: 2019-01-06 | Disposition: A | Discharge status: Home / Self Care | Payer: 59 | Source: Ambulatory Visit | Attending: Obstetrics & Gynecology | Admitting: Obstetrics & Gynecology

## 2019-01-06 ENCOUNTER — Other Ambulatory Visit: Payer: Self-pay | Admitting: Obstetrics & Gynecology

## 2019-01-06 ENCOUNTER — Other Ambulatory Visit: Payer: Self-pay

## 2019-01-06 DIAGNOSIS — N6310 Unspecified lump in the right breast, unspecified quadrant: Secondary | ICD-10-CM

## 2019-01-06 DIAGNOSIS — R928 Other abnormal and inconclusive findings on diagnostic imaging of breast: Secondary | ICD-10-CM

## 2019-01-07 ENCOUNTER — Telehealth: Payer: Self-pay | Admitting: Hematology and Oncology

## 2019-01-07 ENCOUNTER — Encounter: Payer: Self-pay | Admitting: *Deleted

## 2019-01-07 NOTE — Telephone Encounter (Signed)
Spoke with patient to confirm morning [facility] appointment for 7/22, packet mailed and emailed

## 2019-01-08 ENCOUNTER — Other Ambulatory Visit: Payer: Self-pay | Admitting: *Deleted

## 2019-01-08 DIAGNOSIS — C50411 Malignant neoplasm of upper-outer quadrant of right female breast: Secondary | ICD-10-CM | POA: Insufficient documentation

## 2019-01-08 DIAGNOSIS — Z170 Estrogen receptor positive status [ER+]: Secondary | ICD-10-CM | POA: Insufficient documentation

## 2019-01-09 ENCOUNTER — Encounter: Payer: Self-pay | Admitting: General Surgery

## 2019-01-13 ENCOUNTER — Inpatient Hospital Stay: Payer: 59

## 2019-01-13 ENCOUNTER — Inpatient Hospital Stay: Payer: 59 | Admitting: Hematology and Oncology

## 2019-01-13 ENCOUNTER — Telehealth: Payer: Self-pay | Admitting: *Deleted

## 2019-01-13 ENCOUNTER — Ambulatory Visit: Payer: 59 | Admitting: Radiation Oncology

## 2019-01-13 NOTE — Telephone Encounter (Signed)
Received notification that pt has decided to choose own team. Physician team notified. Appts cancelled

## 2019-01-20 ENCOUNTER — Telehealth: Payer: Self-pay | Admitting: Oncology

## 2019-01-20 NOTE — Telephone Encounter (Signed)
Patient has been cld and scheduled to see Dr. Jana Hakim on 8/6 at 4pm. Pt aware to arrive 20 minutes.

## 2019-01-21 ENCOUNTER — Other Ambulatory Visit: Payer: Self-pay | Admitting: General Surgery

## 2019-01-21 DIAGNOSIS — Z170 Estrogen receptor positive status [ER+]: Secondary | ICD-10-CM

## 2019-01-21 DIAGNOSIS — C50411 Malignant neoplasm of upper-outer quadrant of right female breast: Secondary | ICD-10-CM

## 2019-01-23 HISTORY — PX: BREAST LUMPECTOMY: SHX2

## 2019-01-27 NOTE — Pre-Procedure Instructions (Signed)
Valree R Gadberry  01/27/2019      [id number] [place] [place] - [address] [place] [address] Phone: [phone] Fax: [phone]    Your procedure is scheduled on February 04, 2019.  Report to [facility] Admitting at 530 AM.  Call this number if you have problems the morning of surgery:  [phone]   Remember:  Do not eat or drink after midnight.  You may drink clear liquids until 430 AM.  Clear liquids allowed [id number] Pre-Surgery Drink, Water, Juice (non-citric and without pulp), Clear Tea, Black Coffee only and Gatorade  Please complete your PRE-SURGERY ENSURE that was provided to you by 430 AM the morning of surgery.  Please, if able, drink it in one setting. DO NOT SIP.   Take these medicines the morning of surgery with A SIP OF WATER -None  7 days prior to surgery STOP taking any Meloxicam (Mobic), Diclofenac (Voltaren) gel, Aspirin (unless otherwise instructed by your surgeon), Aleve, Naproxen, Ibuprofen, Motrin, Advil, Goody's, BC's, all herbal medications, fish oil, and all vitamins   [place]- Preparing For Surgery  Before surgery, you can play an important role. Because skin is not sterile, your skin needs to be as free of germs as possible. You can reduce the number of germs on your skin by washing with CHG (chlorahexidine gluconate) Soap before surgery.  CHG is an antiseptic cleaner which kills germs and bonds with the skin to continue killing germs even after washing.    Oral Hygiene is also important to reduce your risk of infection.  Remember - BRUSH YOUR TEETH THE MORNING OF SURGERY WITH YOUR REGULAR TOOTHPASTE  Please do not use if you have an allergy to CHG or antibacterial soaps. If your skin becomes reddened/irritated stop using the CHG.  Do not shave (including legs and underarms) for at least 48 hours prior to first CHG shower. It is OK to shave your face.  Please follow these instructions  carefully.   1. Shower the NIGHT BEFORE SURGERY and the MORNING OF SURGERY with CHG.   2. If you chose to wash your hair, wash your hair first as usual with your normal shampoo.  3. After you shampoo, rinse your hair and body thoroughly to remove the shampoo.  4. Use CHG as you would any other liquid soap. You can apply CHG directly to the skin and wash gently with a scrungie or a clean washcloth.   5. Apply the CHG Soap to your body ONLY FROM THE NECK DOWN.  Do not use on open wounds or open sores. Avoid contact with your eyes, ears, mouth and genitals (private parts). Wash Face and genitals (private parts)  with your normal soap.  6. Wash thoroughly, paying special attention to the area where your surgery will be performed.  7. Thoroughly rinse your body with warm water from the neck down.  8. DO NOT shower/wash with your normal soap after using and rinsing off the CHG Soap.  9. Pat yourself dry with a CLEAN TOWEL.  10. Wear CLEAN PAJAMAS to bed the night before surgery, wear comfortable clothes the morning of surgery  11. Place CLEAN SHEETS on your bed the night of your first shower and DO NOT SLEEP WITH PETS.  Day of Surgery: Shower as above Do not apply any deodorants/lotions.  Please wear clean clothes to the hospital/surgery center.   Remember to brush your teeth WITH YOUR REGULAR TOOTHPASTE.   Day  of surgery:  Do not wear jewelry, make-up or nail polish.  Do not wear lotions, powders, or perfumes, or deodorant.  Do not shave 48 hours prior to surgery.   Do not bring valuables to the hospital.  [facility] is not responsible for any belongings or valuables.  IF you are a smoker, DO NOT Smoke 24 hours prior to surgery   IF you wear a CPAP at night please bring your mask, tubing, and machine the morning of surgery    Remember that you must have someone to transport you home after your surgery, and remain with you for 24 hours if you are discharged the same  day.  Contacts, dentures or bridgework may not be worn into surgery.  Leave your suitcase in the car.  After surgery it may be brought to your room.  For patients admitted to the hospital, discharge time will be determined by your treatment team.  Patients discharged the day of surgery will not be allowed to drive home.   Please read over the following fact sheets that you were given.
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
## 2019-01-28 ENCOUNTER — Other Ambulatory Visit: Payer: Self-pay

## 2019-01-28 ENCOUNTER — Inpatient Hospital Stay: Payer: 59 | Attending: Oncology | Admitting: Oncology

## 2019-01-28 VITALS — BP 120/62 | HR 62 | Temp 99.1°F | Resp 18 | Ht 65.0 in | Wt 154.7 lb

## 2019-01-28 DIAGNOSIS — Z90710 Acquired absence of both cervix and uterus: Secondary | ICD-10-CM | POA: Diagnosis not present

## 2019-01-28 DIAGNOSIS — M1990 Unspecified osteoarthritis, unspecified site: Secondary | ICD-10-CM | POA: Insufficient documentation

## 2019-01-28 DIAGNOSIS — Z79899 Other long term (current) drug therapy: Secondary | ICD-10-CM | POA: Diagnosis not present

## 2019-01-28 DIAGNOSIS — C50411 Malignant neoplasm of upper-outer quadrant of right female breast: Secondary | ICD-10-CM | POA: Insufficient documentation

## 2019-01-28 DIAGNOSIS — C170 Malignant neoplasm of duodenum: Secondary | ICD-10-CM | POA: Insufficient documentation

## 2019-01-28 DIAGNOSIS — Z170 Estrogen receptor positive status [ER+]: Secondary | ICD-10-CM

## 2019-01-28 DIAGNOSIS — K219 Gastro-esophageal reflux disease without esophagitis: Secondary | ICD-10-CM | POA: Insufficient documentation

## 2019-01-29 ENCOUNTER — Other Ambulatory Visit: Payer: Self-pay

## 2019-01-29 ENCOUNTER — Encounter: Payer: Self-pay | Admitting: *Deleted

## 2019-01-29 ENCOUNTER — Encounter (HOSPITAL_COMMUNITY)
Admission: RE | Admit: 2019-01-29 | Discharge: 2019-01-29 | Disposition: A | Discharge status: Home / Self Care | Payer: 59 | Source: Ambulatory Visit | Attending: General Surgery | Admitting: General Surgery

## 2019-01-29 ENCOUNTER — Telehealth: Payer: Self-pay | Admitting: Oncology

## 2019-01-29 ENCOUNTER — Encounter (HOSPITAL_COMMUNITY): Payer: Self-pay

## 2019-01-29 DIAGNOSIS — Z20828 Contact with and (suspected) exposure to other viral communicable diseases: Secondary | ICD-10-CM | POA: Diagnosis not present

## 2019-01-29 DIAGNOSIS — Z01812 Encounter for preprocedural laboratory examination: Secondary | ICD-10-CM | POA: Insufficient documentation

## 2019-01-29 HISTORY — DX: Malignant (primary) neoplasm, unspecified: C80.1

## 2019-01-29 LAB — BASIC METABOLIC PANEL
Anion gap: 9 (ref 5–15)
BUN: 15 mg/dL (ref 8–23)
CO2: 26 mmol/L (ref 22–32)
Calcium: 9.2 mg/dL (ref 8.9–10.3)
Chloride: 106 mmol/L (ref 98–111)
Creatinine, Ser: 0.77 mg/dL (ref 0.44–1.00)
GFR calc Af Amer: >60 mL/min (ref >60)
GFR calc non Af Amer: >60 mL/min (ref >60)
Glucose, Bld: 92 mg/dL (ref 70–99)
Potassium: 4.2 mmol/L (ref 3.5–5.1)
Sodium: 141 mmol/L (ref 135–145)

## 2019-01-29 LAB — CBC
HCT: 43.7 % (ref 36.0–46.0)
Hemoglobin: 14.1 g/dL (ref 12.0–15.0)
MCH: 30.3 pg (ref 26.0–34.0)
MCHC: 32.3 g/dL (ref 30.0–36.0)
MCV: 93.8 fL (ref 80.0–100.0)
Platelets: 318 10*3/uL (ref 150–400)
RBC: 4.66 MIL/uL (ref 3.87–5.11)
RDW: 11.8 % (ref 11.5–15.5)
WBC: 6.8 10*3/uL (ref 4.0–10.5)
nRBC: 0 % (ref 0.0–0.2)

## 2019-01-29 NOTE — Progress Notes (Addendum)
PCP - Dr. Annye Asa Cardiologist - pt denies  Chest x-ray - pt denies EKG - pt denies  Stress Test - pt denies ECHO - pt denies  Cardiac Cath - pt denies  Sleep Study - pt denies CPAP - n/a  Fasting Blood Sugar - n/a Checks Blood Sugar _____ times a day-n/a  Blood Thinner Instructions: pt denies Aspirin Instructions:pt denies  Anesthesia review: no  Patient denies shortness of breath, fever, cough and chest pain at PAT appointment  Patient verbalized understanding of instructions that were given to them at the PAT appointment. Patient was also instructed that they will need to review over the PAT instructions again at home before surgery.   Coronavirus Screening  Have you experienced the following symptoms:  Cough yes/no: No Fever (>100.53F)  yes/no: No Runny nose yes/no: No Sore throat yes/no: No Difficulty breathing/shortness of breath  yes/no: No  Have you or a family member traveled in the last 14 days and where? yes/no: No   If the patient indicates "YES" to the above questions, their PAT will be rescheduled to limit the exposure to others and, the surgeon will be notified. THE PATIENT WILL NEED TO BE ASYMPTOMATIC FOR 14 DAYS.   If the patient is not experiencing any of these symptoms, the PAT nurse will instruct them to NOT bring anyone with them to their appointment since they may have these symptoms or traveled as well.   Please remind your patients and families that hospital visitation restrictions are in effect and the importance of the restrictions.

## 2019-01-29 NOTE — Telephone Encounter (Signed)
I talk with patient regarding schedule  

## 2019-02-01 ENCOUNTER — Telehealth: Payer: Self-pay | Admitting: Radiation Oncology

## 2019-02-01 ENCOUNTER — Other Ambulatory Visit (HOSPITAL_COMMUNITY)
Admission: RE | Admit: 2019-02-01 | Discharge: 2019-02-01 | Disposition: A | Discharge status: Home / Self Care | Payer: 59 | Source: Ambulatory Visit | Attending: General Surgery | Admitting: General Surgery

## 2019-02-01 DIAGNOSIS — Z01812 Encounter for preprocedural laboratory examination: Secondary | ICD-10-CM | POA: Diagnosis not present

## 2019-02-01 LAB — SARS CORONAVIRUS 2 (TAT 6-24 HRS): SARS Coronavirus 2: NEGATIVE

## 2019-02-01 NOTE — Telephone Encounter (Signed)
New message: ° ° °LVM for patient to return call to schedule from referral received. °

## 2019-02-03 ENCOUNTER — Other Ambulatory Visit: Payer: Self-pay

## 2019-02-03 ENCOUNTER — Ambulatory Visit
Admission: RE | Admit: 2019-02-03 | Discharge: 2019-02-03 | Disposition: A | Discharge status: Home / Self Care | Payer: 59 | Source: Ambulatory Visit | Attending: General Surgery | Admitting: General Surgery

## 2019-02-03 DIAGNOSIS — Z170 Estrogen receptor positive status [ER+]: Secondary | ICD-10-CM

## 2019-02-03 DIAGNOSIS — C50411 Malignant neoplasm of upper-outer quadrant of right female breast: Secondary | ICD-10-CM

## 2019-02-04 ENCOUNTER — Ambulatory Visit (HOSPITAL_COMMUNITY)
Admission: RE | Admit: 2019-02-04 | Discharge: 2019-02-04 | Disposition: A | Discharge status: Home / Self Care | Payer: 59 | Attending: General Surgery | Admitting: General Surgery

## 2019-02-04 ENCOUNTER — Encounter (HOSPITAL_COMMUNITY): Payer: Self-pay

## 2019-02-04 ENCOUNTER — Ambulatory Visit (HOSPITAL_COMMUNITY): Payer: 59 | Admitting: Certified Registered"

## 2019-02-04 ENCOUNTER — Other Ambulatory Visit: Payer: Self-pay

## 2019-02-04 ENCOUNTER — Ambulatory Visit (HOSPITAL_COMMUNITY)
Admission: RE | Admit: 2019-02-04 | Discharge: 2019-02-04 | Disposition: A | Discharge status: Home / Self Care | Payer: 59 | Source: Ambulatory Visit | Attending: General Surgery | Admitting: General Surgery

## 2019-02-04 ENCOUNTER — Encounter (HOSPITAL_COMMUNITY)
Admission: RE | Disposition: A | Discharge status: Home / Self Care | Payer: Self-pay | Source: Home / Self Care | Attending: General Surgery

## 2019-02-04 ENCOUNTER — Ambulatory Visit
Admission: RE | Admit: 2019-02-04 | Discharge: 2019-02-04 | Disposition: A | Discharge status: Home / Self Care | Payer: 59 | Source: Ambulatory Visit | Attending: General Surgery | Admitting: General Surgery

## 2019-02-04 DIAGNOSIS — F909 Attention-deficit hyperactivity disorder, unspecified type: Secondary | ICD-10-CM | POA: Diagnosis not present

## 2019-02-04 DIAGNOSIS — Z170 Estrogen receptor positive status [ER+]: Secondary | ICD-10-CM

## 2019-02-04 DIAGNOSIS — C50411 Malignant neoplasm of upper-outer quadrant of right female breast: Secondary | ICD-10-CM

## 2019-02-04 DIAGNOSIS — F419 Anxiety disorder, unspecified: Secondary | ICD-10-CM | POA: Insufficient documentation

## 2019-02-04 DIAGNOSIS — M1990 Unspecified osteoarthritis, unspecified site: Secondary | ICD-10-CM | POA: Diagnosis not present

## 2019-02-04 DIAGNOSIS — K5790 Diverticulosis of intestine, part unspecified, without perforation or abscess without bleeding: Secondary | ICD-10-CM | POA: Insufficient documentation

## 2019-02-04 HISTORY — PX: BREAST LUMPECTOMY: SHX2

## 2019-02-04 HISTORY — PX: BREAST LUMPECTOMY WITH RADIOACTIVE SEED AND SENTINEL LYMPH NODE BIOPSY: SHX6550

## 2019-02-04 SURGERY — BREAST LUMPECTOMY WITH RADIOACTIVE SEED AND SENTINEL LYMPH NODE BIOPSY
Anesthesia: General | Site: Breast | Laterality: Right

## 2019-02-04 MED ORDER — PROPOFOL 10 MG/ML IV BOLUS
INTRAVENOUS | Status: AC
  Filled 2019-02-04: qty 20

## 2019-02-04 MED ORDER — OXYCODONE HCL 5 MG PO TABS
5.0000 mg | ORAL_TABLET | Freq: Once | ORAL | Status: AC | PRN
  Administered 2019-02-04: 5 mg via ORAL

## 2019-02-04 MED ORDER — OXYCODONE HCL 5 MG PO TABS
ORAL_TABLET | ORAL | Status: AC
  Filled 2019-02-04: qty 1

## 2019-02-04 MED ORDER — LIDOCAINE HCL (CARDIAC) PF 100 MG/5ML IV SOSY
PREFILLED_SYRINGE | INTRAVENOUS | Status: DC | PRN
  Administered 2019-02-04: 50 mg via INTRAVENOUS

## 2019-02-04 MED ORDER — BUPIVACAINE-EPINEPHRINE (PF) 0.5% -1:200000 IJ SOLN
INTRAMUSCULAR | Status: DC | PRN
  Administered 2019-02-04: 25 mL via PERINEURAL

## 2019-02-04 MED ORDER — ACETAMINOPHEN 500 MG PO TABS
1000.0000 mg | ORAL_TABLET | ORAL | Status: AC
  Administered 2019-02-04: 1000 mg via ORAL

## 2019-02-04 MED ORDER — LIDOCAINE 2% (20 MG/ML) 5 ML SYRINGE
INTRAMUSCULAR | Status: AC
  Filled 2019-02-04: qty 5

## 2019-02-04 MED ORDER — OXYCODONE HCL 5 MG/5ML PO SOLN: 5.0000 mg | Freq: Once | ORAL | Status: AC | PRN

## 2019-02-04 MED ORDER — ONDANSETRON HCL 4 MG/2ML IJ SOLN: 4.0000 mg | Freq: Four times a day (QID) | INTRAMUSCULAR | Status: DC | PRN

## 2019-02-04 MED ORDER — BUPIVACAINE-EPINEPHRINE (PF) 0.25% -1:200000 IJ SOLN
INTRAMUSCULAR | Status: AC
  Filled 2019-02-04: qty 30

## 2019-02-04 MED ORDER — TECHNETIUM TC 99M SULFUR COLLOID FILTERED
1.0000 | Freq: Once | INTRAVENOUS | Status: AC | PRN
  Administered 2019-02-04: 1 via INTRADERMAL

## 2019-02-04 MED ORDER — GABAPENTIN 100 MG PO CAPS
ORAL_CAPSULE | ORAL | Status: AC
  Administered 2019-02-04: 06:00:00 100 mg via ORAL
  Filled 2019-02-04: qty 1

## 2019-02-04 MED ORDER — CEFAZOLIN SODIUM-DEXTROSE 2-4 GM/100ML-% IV SOLN
2.0000 g | INTRAVENOUS | Status: AC
  Administered 2019-02-04: 2 g via INTRAVENOUS

## 2019-02-04 MED ORDER — METHYLENE BLUE 0.5 % INJ SOLN
INTRAVENOUS | Status: AC
  Filled 2019-02-04: qty 10

## 2019-02-04 MED ORDER — FENTANYL CITRATE (PF) 250 MCG/5ML IJ SOLN
INTRAMUSCULAR | Status: AC
  Filled 2019-02-04: qty 5

## 2019-02-04 MED ORDER — MIDAZOLAM HCL 5 MG/5ML IJ SOLN
INTRAMUSCULAR | Status: DC | PRN
  Administered 2019-02-04 (×2): 1 mg via INTRAVENOUS

## 2019-02-04 MED ORDER — GABAPENTIN 100 MG PO CAPS
100.0000 mg | ORAL_CAPSULE | ORAL | Status: AC
  Administered 2019-02-04: 100 mg via ORAL

## 2019-02-04 MED ORDER — CEFAZOLIN SODIUM-DEXTROSE 2-4 GM/100ML-% IV SOLN
INTRAVENOUS | Status: AC
  Filled 2019-02-04: qty 100

## 2019-02-04 MED ORDER — ONDANSETRON HCL 4 MG/2ML IJ SOLN
INTRAMUSCULAR | Status: AC
  Filled 2019-02-04: qty 2

## 2019-02-04 MED ORDER — PROPOFOL 10 MG/ML IV BOLUS
INTRAVENOUS | Status: DC | PRN
  Administered 2019-02-04: 160 mg via INTRAVENOUS

## 2019-02-04 MED ORDER — ACETAMINOPHEN 500 MG PO TABS
ORAL_TABLET | ORAL | Status: AC
  Administered 2019-02-04: 06:00:00 1000 mg via ORAL
  Filled 2019-02-04: qty 2

## 2019-02-04 MED ORDER — FENTANYL CITRATE (PF) 250 MCG/5ML IJ SOLN
INTRAMUSCULAR | Status: DC | PRN
  Administered 2019-02-04: 50 ug via INTRAVENOUS
  Administered 2019-02-04: 25 ug via INTRAVENOUS
  Administered 2019-02-04: 50 ug via INTRAVENOUS

## 2019-02-04 MED ORDER — 0.9 % SODIUM CHLORIDE (POUR BTL) OPTIME
TOPICAL | Status: DC | PRN
  Administered 2019-02-04: 1000 mL

## 2019-02-04 MED ORDER — DEXAMETHASONE SODIUM PHOSPHATE 10 MG/ML IJ SOLN
INTRAMUSCULAR | Status: AC
  Filled 2019-02-04: qty 1

## 2019-02-04 MED ORDER — KETOROLAC TROMETHAMINE 15 MG/ML IJ SOLN
15.0000 mg | INTRAMUSCULAR | Status: AC
  Administered 2019-02-04: 07:00:00 15 mg via INTRAVENOUS

## 2019-02-04 MED ORDER — MIDAZOLAM HCL 2 MG/2ML IJ SOLN
INTRAMUSCULAR | Status: AC
  Filled 2019-02-04: qty 2

## 2019-02-04 MED ORDER — BUPIVACAINE-EPINEPHRINE 0.25% -1:200000 IJ SOLN
INTRAMUSCULAR | Status: DC | PRN
  Administered 2019-02-04: 7 mL

## 2019-02-04 MED ORDER — FENTANYL CITRATE (PF) 100 MCG/2ML IJ SOLN: 25.0000 ug | INTRAMUSCULAR | Status: DC | PRN

## 2019-02-04 MED ORDER — PHENYLEPHRINE 40 MCG/ML (10ML) SYRINGE FOR IV PUSH (FOR BLOOD PRESSURE SUPPORT)
PREFILLED_SYRINGE | INTRAVENOUS | Status: AC
  Filled 2019-02-04: qty 10

## 2019-02-04 MED ORDER — OXYCODONE HCL 5 MG PO TABS: 5.0000 mg | ORAL_TABLET | Freq: Four times a day (QID) | ORAL | 0 refills | Status: DC | PRN

## 2019-02-04 MED ORDER — LACTATED RINGERS IV SOLN
INTRAVENOUS | Status: DC | PRN
  Administered 2019-02-04: 08:00:00 via INTRAVENOUS

## 2019-02-04 MED ORDER — PHENYLEPHRINE HCL (PRESSORS) 10 MG/ML IV SOLN
INTRAVENOUS | Status: DC | PRN
  Administered 2019-02-04 (×6): 80 ug via INTRAVENOUS

## 2019-02-04 MED ORDER — KETOROLAC TROMETHAMINE 15 MG/ML IJ SOLN
INTRAMUSCULAR | Status: AC
  Filled 2019-02-04: qty 1

## 2019-02-04 MED ORDER — SODIUM CHLORIDE (PF) 0.9 % IJ SOLN
INTRAMUSCULAR | Status: AC
  Filled 2019-02-04: qty 10

## 2019-02-04 MED ORDER — ONDANSETRON HCL 4 MG/2ML IJ SOLN
INTRAMUSCULAR | Status: DC | PRN
  Administered 2019-02-04: 4 mg via INTRAVENOUS

## 2019-02-04 SURGICAL SUPPLY — 48 items
ADH SKN CLS APL DERMABOND .7 (GAUZE/BANDAGES/DRESSINGS) ×1
APL PRP STRL LF DISP 70% ISPRP (MISCELLANEOUS) ×1
APPLIER CLIP 9.375 MED OPEN (MISCELLANEOUS) ×2
APR CLP MED 9.3 20 MLT OPN (MISCELLANEOUS) ×1
BINDER BREAST LRG (GAUZE/BANDAGES/DRESSINGS) IMPLANT
BINDER BREAST XLRG (GAUZE/BANDAGES/DRESSINGS) IMPLANT
CANISTER SUCT 3000ML PPV (MISCELLANEOUS) ×2 IMPLANT
CHLORAPREP W/TINT 26 (MISCELLANEOUS) ×2 IMPLANT
CLIP APPLIE 9.375 MED OPEN (MISCELLANEOUS) IMPLANT
CLIP VESOCCLUDE MED 6/CT (CLIP) ×2 IMPLANT
COVER PROBE W GEL 5X96 (DRAPES) ×2 IMPLANT
COVER SURGICAL LIGHT HANDLE (MISCELLANEOUS) ×2 IMPLANT
COVER WAND RF STERILE (DRAPES) ×2 IMPLANT
DERMABOND ADVANCED (GAUZE/BANDAGES/DRESSINGS) ×1
DERMABOND ADVANCED .7 DNX12 (GAUZE/BANDAGES/DRESSINGS) ×1 IMPLANT
DEVICE DUBIN SPECIMEN MAMMOGRA (MISCELLANEOUS) ×2 IMPLANT
DRAPE CHEST BREAST 15X10 FENES (DRAPES) ×2 IMPLANT
ELECT COATED BLADE 2.86 ST (ELECTRODE) ×2 IMPLANT
ELECT REM PT RETURN 9FT ADLT (ELECTROSURGICAL) ×2
ELECTRODE REM PT RTRN 9FT ADLT (ELECTROSURGICAL) ×1 IMPLANT
GLOVE BIO SURGEON STRL SZ7 (GLOVE) ×2 IMPLANT
GLOVE BIOGEL PI IND STRL 7.5 (GLOVE) ×1 IMPLANT
GLOVE BIOGEL PI INDICATOR 7.5 (GLOVE) ×1
GOWN STRL REUS W/ TWL LRG LVL3 (GOWN DISPOSABLE) ×2 IMPLANT
GOWN STRL REUS W/TWL LRG LVL3 (GOWN DISPOSABLE) ×4
ILLUMINATOR WAVEGUIDE N/F (MISCELLANEOUS) ×1 IMPLANT
KIT BASIN OR (CUSTOM PROCEDURE TRAY) ×2 IMPLANT
KIT MARKER MARGIN INK (KITS) ×2 IMPLANT
NDL 18GX1X1/2 (RX/OR ONLY) (NEEDLE) IMPLANT
NDL FILTER BLUNT 18X1 1/2 (NEEDLE) IMPLANT
NDL HYPO 25GX1X1/2 BEV (NEEDLE) ×1 IMPLANT
NEEDLE 18GX1X1/2 (RX/OR ONLY) (NEEDLE) IMPLANT
NEEDLE FILTER BLUNT 18X 1/2SAF (NEEDLE)
NEEDLE FILTER BLUNT 18X1 1/2 (NEEDLE) IMPLANT
NEEDLE HYPO 25GX1X1/2 BEV (NEEDLE) ×2 IMPLANT
NS IRRIG 1000ML POUR BTL (IV SOLUTION) ×2 IMPLANT
PACK GENERAL/GYN (CUSTOM PROCEDURE TRAY) ×2 IMPLANT
STRIP CLOSURE SKIN 1/2X4 (GAUZE/BANDAGES/DRESSINGS) ×2 IMPLANT
SUT MNCRL AB 4-0 PS2 18 (SUTURE) ×4 IMPLANT
SUT MON AB 5-0 PS2 18 (SUTURE) IMPLANT
SUT SILK 2 0 SH (SUTURE) IMPLANT
SUT VIC AB 2-0 SH 27 (SUTURE) ×4
SUT VIC AB 2-0 SH 27XBRD (SUTURE) ×2 IMPLANT
SUT VIC AB 3-0 SH 27 (SUTURE) ×4
SUT VIC AB 3-0 SH 27X BRD (SUTURE) ×2 IMPLANT
SYR CONTROL 10ML LL (SYRINGE) ×2 IMPLANT
TOWEL GREEN STERILE (TOWEL DISPOSABLE) ×2 IMPLANT
TOWEL GREEN STERILE FF (TOWEL DISPOSABLE) ×2 IMPLANT

## 2019-02-04 NOTE — H&P (View-Only) (Signed)
63 yof referred by Dr Dory Horn for new right breast cancer. she has no fh of breast or ovarian cancer. she has no prior breast history. she had no mass or dc. screening mm shows c density breasts with a right uoq mass. on [place] this is 6x5x20mm and there is no axillary adenopathy. 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 she is here with her husband to discuss options  Past Surgical History (Tanisha A. Owens Shark, [place]; 01/19/2019 3:59 PM) Breast Biopsy  Right. Colon Polyp Removal - Colonoscopy  Foot Surgery  Bilateral. Hip Surgery  Right. Hysterectomy (not due to cancer) - Complete  Oral Surgery   Diagnostic Studies History (Tanisha A. Owens Shark, [place]; 01/19/2019 3:59 PM) Colonoscopy  within last year Mammogram  within last year Pap Smear  1-5 years ago  Allergies (Tanisha A. Owens Shark, [place]; 01/19/2019 4:00 PM) No Known Drug Allergies  [01/19/2019]: Allergies Reconciled   Medication History (Tanisha A. Owens Shark, RMA; 01/19/2019 4:00 PM) Xanax (0.5MG  Tablet, Oral) Active. Calcium-Magnesium-Vitamin D (200-100-33.3MG -MG-UNIT Capsule, Oral) Active. Diclofenac Sodium (1% Gel, Transdermal) Active. Premarin (0.3MG  Tablet, Oral) Active. Mobic (7.5MG  Tablet, Oral) Active. Ritalin (10MG  Tablet, Oral) Active. Multiple Vitamin (1 (one) Oral) Active. Zolpidem Tartrate (10MG  Tablet, Oral) Active. Medications Reconciled  Social History (Tanisha A. Owens Shark, [place]; 01/19/2019 3:59 PM) Alcohol use  Occasional alcohol use. Caffeine use  Coffee. No drug use  Tobacco use  Never smoker.  Family History (Tanisha A. Owens Shark, [place]; 01/19/2019 3:59 PM) Alcohol Abuse  Father, Mother. Arthritis  Father. Cerebrovascular Accident  Mother. Depression  Mother. Diabetes Mellitus  Father. Heart Disease  Mother. Hypertension  Mother. Melanoma  Mother. Respiratory Condition  Son. Thyroid problems  Mother, Sister.  Pregnancy / Birth History  (Tanisha A. Owens Shark, [place]; 01/19/2019 3:59 PM) Age at menarche  63 years. Age of menopause  64-50 Gravida  1 Irregular periods  Length (months) of breastfeeding  >7 Maternal age  18-35 Para  1  Other Problems (Tanisha A. Owens Shark, [place]; 01/19/2019 3:59 PM) Arthritis  Diverticulosis  Hemorrhoids  Other disease, cancer, significant illness     Review of Systems (Tanisha A. Brown RMA; 01/19/2019 3:59 PM) General Not Present- Appetite Loss, Chills, Fatigue, Fever, Night Sweats, Weight Gain and Weight Loss. Skin Not Present- Change in Wart/Mole, Dryness, Hives, Jaundice, New Lesions, Non-Healing Wounds, Rash and Ulcer. HEENT Not Present- Earache, Hearing Loss, Hoarseness, Nose Bleed, Oral Ulcers, Ringing in the Ears, Seasonal Allergies, Sinus Pain, Sore Throat, Visual Disturbances, Wears glasses/contact lenses and Yellow Eyes. Respiratory Not Present- Bloody sputum, Chronic Cough, Difficulty Breathing, Snoring and Wheezing. Breast Present- Breast Mass. Not Present- Breast Pain, Nipple Discharge and Skin Changes. Cardiovascular Not Present- Chest Pain, Difficulty Breathing Lying Down, Leg Cramps, Palpitations, Rapid Heart Rate, Shortness of Breath and Swelling of Extremities. Gastrointestinal Not Present- Abdominal Pain, Bloating, Bloody Stool, Change in Bowel Habits, Chronic diarrhea, Constipation, Difficulty Swallowing, Excessive gas, Gets full quickly at meals, Hemorrhoids, Indigestion, Nausea, Rectal Pain and Vomiting. Female Genitourinary Not Present- Frequency, Nocturia, Painful Urination, Pelvic Pain and Urgency. Musculoskeletal Present- Joint Pain. Not Present- Back Pain, Joint Stiffness, Muscle Pain, Muscle Weakness and Swelling of Extremities. Neurological Not Present- Decreased Memory, Fainting, Headaches, Numbness, Seizures, Tingling, Tremor, Trouble walking and Weakness. Psychiatric Not Present- Anxiety, Bipolar, Change in Sleep Pattern, Depression, Fearful and Frequent  crying. Endocrine Not Present- Cold Intolerance, Excessive Hunger, Hair Changes, Heat Intolerance, Hot flashes and New Diabetes. Hematology Present-  Blood Thinners. Not Present- Easy Bruising, Excessive bleeding, Gland problems, HIV and Persistent Infections.  Vitals (Tanisha A. Brown RMA; 01/19/2019 4:00 PM) 01/19/2019 3:59 PM Weight: 152.6 lb Height: 64in Body Surface Area: 1.74 m Body Mass Index: 26.19 kg/m  Temp.: 97.106F  Pulse: 97 (Regular)  BP: 128/84(Sitting, Left Arm, Standard) Physical Exam Rolm Bookbinder MD; 01/19/2019 4:40 PM) General Mental Status-Alert. Orientation-Oriented X3. Head and Neck Trachea-midline. Thyroid Gland Characteristics - normal size and consistency. Eye Sclera/Conjunctiva - Bilateral-No scleral icterus. Chest and Lung Exam Chest and lung exam reveals -quiet, even and easy respiratory effort with no use of accessory muscles. Breast Nipples-No Discharge. Breast - Bilateral-Symmetric, Non Tender. Breast Lump-No Palpable Breast Mass. Cardiovascular Cardiovascular examination reveals -normal heart sounds, regular rate and rhythm with no murmurs. Abdomen Note: soft no hepatomegaly Neurologic Neurologic evaluation reveals -alert and oriented x 3 with no impairment of recent or remote memory. Lymphatic Head & Neck General Head & Neck Lymphatics: Bilateral - Description - Normal. Axillary General Axillary Region: Bilateral - Description - Normal. 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 we discussed all available treatment options. she appears to have stage I disease that is biologically favorable. I provided her path report and discussed it in its enirety today I think she  needs sn biopsy due to age < 64. we discussed performance with radioactivity and removing the sentinel nodes (on average 2-3). discussed risk of shoulder dysfunction and lymphedema postop. discussed pt/clt visit postop as well. we also discussed lumpectomy vs mastectomy. there is no medical benefit to mastectomy and she does not desire mastectomy. discussed lumpectomy with seed guidance. she will need radiotherapy. should be good candidate for hypofractionation. will refer when ready. she understands 5% risk of positive margins with lumpectomy requiring another surgery. risks of surgery and recovery discussed. will proceed in next couple weeks.

## 2019-02-04 NOTE — Discharge Instructions (Signed)
[facility] °Office Phone Number [phone] ° °POST OP INSTRUCTIONS °Take 400 mg of ibuprofen every 8 hours or 650 mg tylenol every 6 hours for next 72 hours then as needed. Use ice several times daily also. °Always review your discharge instruction sheet given to you by the facility where your surgery was performed. ° °IF YOU HAVE DISABILITY OR FAMILY LEAVE FORMS, YOU MUST BRING THEM TO THE OFFICE FOR PROCESSING.  DO NOT GIVE THEM TO YOUR DOCTOR. ° °1. A prescription for pain medication may be given to you upon discharge.  Take your pain medication as prescribed, if needed.  If narcotic pain medicine is not needed, then you may take acetaminophen (Tylenol), naprosyn (Alleve) or ibuprofen (Advil) as needed. °2. Take your usually prescribed medications unless otherwise directed °3. If you need a refill on your pain medication, please contact your pharmacy.  They will contact our office to request authorization.  Prescriptions will not be filled after 5pm or on week-ends. °4. You should eat very light the first 24 hours after surgery, such as soup, crackers, pudding, etc.  Resume your normal diet the day after surgery. °5. Most patients will experience some swelling and bruising in the breast.  Ice packs and a good support bra will help.  Wear the breast binder provided or a sports bra for 72 hours day and night.  After that wear a sports bra during the day until you return to the office. Swelling and bruising can take several days to resolve.  °6. It is common to experience some constipation if taking pain medication after surgery.  Increasing fluid intake and taking a stool softener will usually help or prevent this problem from occurring.  A mild laxative (Milk of Magnesia or Miralax) should be taken according to package directions if there are no bowel movements after 48 hours. °7. Unless discharge instructions indicate otherwise, you may remove your bandages 48 hours after surgery and you may  shower at that time.  You may have steri-strips (small skin tapes) in place directly over the incision.  These strips should be left on the skin for 7-10 days and will come off on their own.  If your surgeon used skin glue on the incision, you may shower in 24 hours.  The glue will flake off over the next 2-3 weeks.  Any sutures or staples will be removed at the office during your follow-up visit. °8. ACTIVITIES:  You may resume regular daily activities (gradually increasing) beginning the next day.  Wearing a good support bra or sports bra minimizes pain and swelling.  You may have sexual intercourse when it is comfortable. °a. You may drive when you no longer are taking prescription pain medication, you can comfortably wear a seatbelt, and you can safely maneuver your car and apply brakes. °b. RETURN TO WORK:  ______________________________________________________________________________________ °9. You should see your doctor in the office for a follow-up appointment approximately two weeks after your surgery.  Your doctor’s nurse will typically make your follow-up appointment when she calls you with your pathology report.  Expect your pathology report 3-4 business days after your surgery.  You may call to check if you do not hear from us after three days. °10. OTHER INSTRUCTIONS: _______________________________________________________________________________________________ _____________________________________________________________________________________________________________________________________ °_____________________________________________________________________________________________________________________________________ °_____________________________________________________________________________________________________________________________________ ° °WHEN TO CALL DR Emmajo Bennette: °1. Fever over 101.0 °2. Nausea and/or vomiting. °3. Extreme swelling or bruising. °4. Continued bleeding from  incision. °5. Increased pain, redness, or drainage from the incision. ° °The clinic staff is available to   answer your questions during regular business hours.  Please don’t hesitate to call and ask to speak to one of the nurses for clinical concerns.  If you have a medical emergency, go to the nearest emergency room or call 911.  A surgeon from [facility] is always on call at the hospital. ° °For further questions, please visit [email or web address] ° °

## 2019-02-04 NOTE — Transfer of Care (Signed)
Immediate Anesthesia Transfer of Care Note  Patient: Neera R Skidgel  Procedure(s) Performed: RIGHT BREAST LUMPECTOMY WITH RADIOACTIVE SEED AND RIGHT AXILLARY SENTINEL LYMPH NODE BIOPSY (Right Breast)  Patient Location: PACU  Anesthesia Type:GA combined with regional for post-op pain  Level of Consciousness: awake, alert  and sedated  Airway & Oxygen Therapy: Patient Spontanous Breathing and Patient connected to face mask oxygen  Post-op Assessment: Report given to RN, Post -op Vital signs reviewed and stable and Patient moving all extremities X 4  Post vital signs: Reviewed and stable  Last Vitals:  Vitals Value Taken Time  BP    Temp    Pulse 64 02/04/19 0902  Resp 11 02/04/19 0902  SpO2 100 % 02/04/19 0902  Vitals shown include unvalidated device data.  Last Pain:  Vitals:   02/04/19 0607  TempSrc:   PainSc: 0-No pain      Patients Stated Pain Goal: 3 (95/63/87 [id number])  Complications: No apparent anesthesia complications
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
## 2019-02-04 NOTE — Op Note (Addendum)
Preoperative diagnosis:Right breast cancer, clinical stage I Postoperative diagnosis: Same as above Procedure: 1. Rightbreast radioactive seed guided lumpectomy 2. Right deep axillary sentinel lymph node biopsy Surgeon: Dr. Serita Grammes Anesthesia: General with a pectoral block Specimens: 1.Rightbreast tissue marked with paint 2.medial, lateral and inferior margins of right breastlumpectomy marked short superior, long lateral, double deep 3.Right axillary sentinel nodes Estimated blood WPYK:99 cc Complications: None Drains: None Sponge and count was correct at completion Disposition to recovery in stable condition  Indications: This is a77 yof with newly diagnosed right breast cancer. This is IDC.  Nodes are negative. We discussed options and have elected to proceed with lumpectomy and sentinel node biopsy.  Procedure: She first had the seed placed.I had these mammograms available for my review in the operating room. After for informed consent was obtained she first underwent a pectoral block and injection of technetium in the standard periareolar fashion. She was then placed under general anesthesia without complication. She was given antibiotics. SCDs were in place. She was then prepped and draped in the standard sterile surgical fashion. A surgical timeout was then performed.  I then identified the seed in theupper outer quadrant of the right breast.I elected to make a periareolar incision to hide the scar. Iinfiltrated Marcaine and made an incision.I then removedthe seed and the surrounding tissue with an attempt to get a clear margin. Hemostasis was observed. I placed some clips. I did a mammogram in the operating room confirming removal of the clip and the seed. It looked closeto the above marginsso I removed these. The posterior margin is the muscle. I eventually mobilized the breast tissue and closed this with 2-0 Vicryl, 3-0 Vicryl, and 5-0  Monocryl. I then infiltrated marcaine in axilla and made an incision below the axillary hairline.  Ithenentered into the axilla. I divided the fascia and entered into the axilla.I then identified radioactive nodes and excised these. There was no more radioactivity. I the obtained hemostasis.  I closed this with 2-0 Vicryl, 3-0 Vicryl, and 4-0 Monocryl. Glue and Steri-Strips were applied.  She tolerated this well was extubated and transferred to the recovery room stable.

## 2019-02-04 NOTE — H&P (Signed)
63 yof referred by Dr Dory Horn for new right breast cancer. she has no fh of breast or ovarian cancer. she has no prior breast history. she had no mass or dc. screening mm shows c density breasts with a right uoq mass. on [place] this is 6x5x76mm and there is no axillary adenopathy. 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 she is here with her husband to discuss options  Past Surgical History (Tanisha A. Owens Shark, [place]; 01/19/2019 3:59 PM) Breast Biopsy  Right. Colon Polyp Removal - Colonoscopy  Foot Surgery  Bilateral. Hip Surgery  Right. Hysterectomy (not due to cancer) - Complete  Oral Surgery   Diagnostic Studies History (Tanisha A. Owens Shark, [place]; 01/19/2019 3:59 PM) Colonoscopy  within last year Mammogram  within last year Pap Smear  1-5 years ago  Allergies (Tanisha A. Owens Shark, [place]; 01/19/2019 4:00 PM) No Known Drug Allergies  [01/19/2019]: Allergies Reconciled   Medication History (Tanisha A. Owens Shark, RMA; 01/19/2019 4:00 PM) Xanax (0.5MG  Tablet, Oral) Active. Calcium-Magnesium-Vitamin D (200-100-33.3MG -MG-UNIT Capsule, Oral) Active. Diclofenac Sodium (1% Gel, Transdermal) Active. Premarin (0.3MG  Tablet, Oral) Active. Mobic (7.5MG  Tablet, Oral) Active. Ritalin (10MG  Tablet, Oral) Active. Multiple Vitamin (1 (one) Oral) Active. Zolpidem Tartrate (10MG  Tablet, Oral) Active. Medications Reconciled  Social History (Tanisha A. Owens Shark, [place]; 01/19/2019 3:59 PM) Alcohol use  Occasional alcohol use. Caffeine use  Coffee. No drug use  Tobacco use  Never smoker.  Family History (Tanisha A. Owens Shark, [employer]; 01/19/2019 3:59 PM) Alcohol Abuse  Father, Mother. Arthritis  Father. Cerebrovascular Accident  Mother. Depression  Mother. Diabetes Mellitus  Father. Heart Disease  Mother. Hypertension  Mother. Melanoma  Mother. Respiratory Condition  Son. Thyroid problems  Mother, Sister.  Pregnancy / Birth History  (Tanisha A. Owens Shark, [place]; 01/19/2019 3:59 PM) Age at menarche  63 years. Age of menopause  28-50 Gravida  1 Irregular periods  Length (months) of breastfeeding  >16 Maternal age  48-35 Para  1  Other Problems (Tanisha A. Owens Shark, [place]; 01/19/2019 3:59 PM) Arthritis  Diverticulosis  Hemorrhoids  Other disease, cancer, significant illness     Review of Systems (Tanisha A. Brown RMA; 01/19/2019 3:59 PM) General Not Present- Appetite Loss, Chills, Fatigue, Fever, Night Sweats, Weight Gain and Weight Loss. Skin Not Present- Change in Wart/Mole, Dryness, Hives, Jaundice, New Lesions, Non-Healing Wounds, Rash and Ulcer. HEENT Not Present- Earache, Hearing Loss, Hoarseness, Nose Bleed, Oral Ulcers, Ringing in the Ears, Seasonal Allergies, Sinus Pain, Sore Throat, Visual Disturbances, Wears glasses/contact lenses and Yellow Eyes. Respiratory Not Present- Bloody sputum, Chronic Cough, Difficulty Breathing, Snoring and Wheezing. Breast Present- Breast Mass. Not Present- Breast Pain, Nipple Discharge and Skin Changes. Cardiovascular Not Present- Chest Pain, Difficulty Breathing Lying Down, Leg Cramps, Palpitations, Rapid Heart Rate, Shortness of Breath and Swelling of Extremities. Gastrointestinal Not Present- Abdominal Pain, Bloating, Bloody Stool, Change in Bowel Habits, Chronic diarrhea, Constipation, Difficulty Swallowing, Excessive gas, Gets full quickly at meals, Hemorrhoids, Indigestion, Nausea, Rectal Pain and Vomiting. Female Genitourinary Not Present- Frequency, Nocturia, Painful Urination, Pelvic Pain and Urgency. Musculoskeletal Present- Joint Pain. Not Present- Back Pain, Joint Stiffness, Muscle Pain, Muscle Weakness and Swelling of Extremities. Neurological Not Present- Decreased Memory, Fainting, Headaches, Numbness, Seizures, Tingling, Tremor, Trouble walking and Weakness. Psychiatric Not Present- Anxiety, Bipolar, Change in Sleep Pattern, Depression, Fearful and Frequent  crying. Endocrine Not Present- Cold Intolerance, Excessive Hunger, Hair Changes, Heat Intolerance, Hot flashes and New Diabetes. Hematology Present-  Blood Thinners. Not Present- Easy Bruising, Excessive bleeding, Gland problems, HIV and Persistent Infections.  Vitals (Tanisha A. Brown RMA; 01/19/2019 4:00 PM) 01/19/2019 3:59 PM Weight: 152.6 lb Height: 64in Body Surface Area: 1.74 m Body Mass Index: 26.19 kg/m  Temp.: 97.67F  Pulse: 97 (Regular)  BP: 128/84(Sitting, Left Arm, Standard) Physical Exam Rolm Bookbinder MD; 01/19/2019 4:40 PM) General Mental Status-Alert. Orientation-Oriented X3. Head and Neck Trachea-midline. Thyroid Gland Characteristics - normal size and consistency. Eye Sclera/Conjunctiva - Bilateral-No scleral icterus. Chest and Lung Exam Chest and lung exam reveals -quiet, even and easy respiratory effort with no use of accessory muscles. Breast Nipples-No Discharge. Breast - Bilateral-Symmetric, Non Tender. Breast Lump-No Palpable Breast Mass. Cardiovascular Cardiovascular examination reveals -normal heart sounds, regular rate and rhythm with no murmurs. Abdomen Note: soft no hepatomegaly Neurologic Neurologic evaluation reveals -alert and oriented x 3 with no impairment of recent or remote memory. Lymphatic Head & Neck General Head & Neck Lymphatics: Bilateral - Description - Normal. Axillary General Axillary Region: Bilateral - Description - Normal. Note: no [place] adenopathy   Assessment & Plan Rolm Bookbinder MD; 01/19/2019 4:47 PM)  BREAST CANCER OF UPPER-OUTER QUADRANT OF RIGHT FEMALE BREAST (C50.411) Story: Right breast seed guided lumpectomy, right axillary sn biopsy We discussed staging and pathphysiology of breast cancer. we discussed all available treatment options. she appears to have stage I disease that is biologically favorable. I provided her path report and discussed it in its enirety today I think she  needs sn biopsy due to age < 64. we discussed performance with radioactivity and removing the sentinel nodes (on average 2-3). discussed risk of shoulder dysfunction and lymphedema postop. discussed pt/clt visit postop as well. we also discussed lumpectomy vs mastectomy. there is no medical benefit to mastectomy and she does not desire mastectomy. discussed lumpectomy with seed guidance. she will need radiotherapy. should be good candidate for hypofractionation. will refer when ready. she understands 5% risk of positive margins with lumpectomy requiring another surgery. risks of surgery and recovery discussed. will proceed in next couple weeks.

## 2019-02-04 NOTE — Anesthesia Procedure Notes (Signed)
Procedure Name: LMA Insertion Date/Time: 02/04/2019 7:41 AM Performed by: Gaylene Brooks, CRNA Pre-anesthesia Checklist: Patient identified, Emergency Drugs available, Suction available and Patient being monitored Patient Re-evaluated:Patient Re-evaluated prior to induction Oxygen Delivery Method: Circle System Utilized Preoxygenation: Pre-oxygenation with 100% oxygen Induction Type: IV induction Ventilation: Mask ventilation without difficulty LMA: LMA inserted LMA Size: 4.0 Number of attempts: 1 Airway Equipment and Method: Bite block Placement Confirmation: positive ETCO2 Tube secured with: Tape Dental Injury: Teeth and Oropharynx as per pre-operative assessment

## 2019-02-04 NOTE — Anesthesia Preprocedure Evaluation (Signed)
Anesthesia Evaluation  Patient identified by MRN, date of birth, ID band Patient awake    Reviewed: Allergy & Precautions, H&P , NPO status , Patient's Chart, lab work & pertinent test results  Airway Mallampati: II   Neck ROM: full    Dental   Pulmonary neg pulmonary ROS,    breath sounds clear to auscultation       Cardiovascular negative cardio ROS   Rhythm:regular Rate:Normal     Neuro/Psych PSYCHIATRIC DISORDERS Anxiety ADHD   GI/Hepatic hiatal hernia, GERD  ,  Endo/Other    Renal/GU      Musculoskeletal  (+) Arthritis ,   Abdominal   Peds  Hematology   Anesthesia Other Findings   Reproductive/Obstetrics                             Anesthesia Physical Anesthesia Plan  ASA: II  Anesthesia Plan: General   Post-op Pain Management:  Regional for Post-op pain   Induction: Intravenous  PONV Risk Score and Plan: 3 and Ondansetron, Dexamethasone, Treatment may vary due to age or medical condition and Midazolam  Airway Management Planned: LMA  Additional Equipment:   Intra-op Plan:   Post-operative Plan:   Informed Consent: I have reviewed the patients History and Physical, chart, labs and discussed the procedure including the risks, benefits and alternatives for the proposed anesthesia with the patient or authorized representative who has indicated his/her understanding and acceptance.       Plan Discussed with: CRNA, Anesthesiologist and Surgeon  Anesthesia Plan Comments:         Anesthesia Quick Evaluation

## 2019-02-04 NOTE — Anesthesia Procedure Notes (Signed)
Anesthesia Regional Block: Pectoralis block   Pre-Anesthetic Checklist: ,, timeout performed, Correct Patient, Correct Site, Correct Laterality, Correct Procedure, Correct Position, site marked, Risks and benefits discussed,  Surgical consent,  Pre-op evaluation,  At surgeon's request and post-op pain management  Laterality: Right  Prep: chloraprep       Needles:  Injection technique: Single-shot  Needle Type: Echogenic Needle     Needle Length: 9cm  Needle Gauge: 21     Additional Needles:   Narrative:  Start time: 02/04/2019 7:12 AM End time: 02/04/2019 7:19 AM Injection made incrementally with aspirations every 5 mL.  Performed by: Personally  Anesthesiologist: Albertha Ghee, MD  Additional Notes: Pt tolerated the procedure well.

## 2019-02-05 ENCOUNTER — Encounter (HOSPITAL_COMMUNITY): Payer: Self-pay | Admitting: General Surgery

## 2019-02-05 NOTE — Anesthesia Postprocedure Evaluation (Signed)
Anesthesia Post Note  Patient: Gloria Frazier  Procedure(s) Performed: RIGHT BREAST LUMPECTOMY WITH RADIOACTIVE SEED AND RIGHT AXILLARY SENTINEL LYMPH NODE BIOPSY (Right Breast)     Patient location during evaluation: PACU Anesthesia Type: General and Regional Level of consciousness: awake and alert Pain management: pain level controlled Vital Signs Assessment: post-procedure vital signs reviewed and stable Respiratory status: spontaneous breathing, nonlabored ventilation, respiratory function stable and patient connected to nasal cannula oxygen Cardiovascular status: blood pressure returned to baseline and stable Postop Assessment: no apparent nausea or vomiting Anesthetic complications: no    Last Vitals:  Vitals:   02/04/19 0917 02/04/19 0932  BP: 135/75 (!) 144/81  Pulse: (!) 59 (!) 56  Resp: 16 13  Temp:  (!) 36.4 C  SpO2: 99% 100%    Last Pain:  Vitals:   02/04/19 0932  TempSrc:   PainSc: [place]

## 2019-02-10 ENCOUNTER — Encounter: Payer: Self-pay | Admitting: *Deleted

## 2019-02-11 ENCOUNTER — Other Ambulatory Visit: Payer: Self-pay | Admitting: General Surgery

## 2019-02-16 ENCOUNTER — Encounter (HOSPITAL_BASED_OUTPATIENT_CLINIC_OR_DEPARTMENT_OTHER): Payer: Self-pay | Admitting: *Deleted

## 2019-02-16 ENCOUNTER — Other Ambulatory Visit: Payer: Self-pay

## 2019-02-18 ENCOUNTER — Other Ambulatory Visit: Payer: Self-pay

## 2019-02-18 ENCOUNTER — Ambulatory Visit: Payer: 59 | Admitting: Physician Assistant

## 2019-02-18 ENCOUNTER — Encounter: Payer: Self-pay | Admitting: Physician Assistant

## 2019-02-18 VITALS — BP 118/73 | HR 59 | Resp 13 | Ht 65.0 in | Wt 157.6 lb

## 2019-02-18 DIAGNOSIS — M2142 Flat foot [pes planus] (acquired), left foot: Secondary | ICD-10-CM

## 2019-02-18 DIAGNOSIS — M1612 Unilateral primary osteoarthritis, left hip: Secondary | ICD-10-CM | POA: Diagnosis not present

## 2019-02-18 DIAGNOSIS — M2480 Other specific joint derangements of unspecified joint, not elsewhere classified: Secondary | ICD-10-CM

## 2019-02-18 DIAGNOSIS — M25562 Pain in left knee: Secondary | ICD-10-CM

## 2019-02-18 DIAGNOSIS — M2141 Flat foot [pes planus] (acquired), right foot: Secondary | ICD-10-CM

## 2019-02-18 DIAGNOSIS — G8929 Other chronic pain: Secondary | ICD-10-CM

## 2019-02-18 DIAGNOSIS — M19041 Primary osteoarthritis, right hand: Secondary | ICD-10-CM | POA: Diagnosis not present

## 2019-02-18 DIAGNOSIS — M7711 Lateral epicondylitis, right elbow: Secondary | ICD-10-CM

## 2019-02-18 DIAGNOSIS — Z96641 Presence of right artificial hip joint: Secondary | ICD-10-CM

## 2019-02-18 DIAGNOSIS — M21619 Bunion of unspecified foot: Secondary | ICD-10-CM

## 2019-02-18 DIAGNOSIS — M19042 Primary osteoarthritis, left hand: Secondary | ICD-10-CM

## 2019-02-18 MED ORDER — MELOXICAM 7.5 MG PO TABS: ORAL_TABLET | ORAL | 2 refills | Status: DC

## 2019-02-18 NOTE — Patient Instructions (Signed)
Journal for Nurse Practitioners, 15(4), 263-267. Retrieved March 30, 2018 from [email or web address].com/[email or web address]">  Knee Exercises Ask your health care provider which exercises are safe for you. Do exercises exactly as told by your health care provider and adjust them as directed. It is normal to feel mild stretching, pulling, tightness, or discomfort as you do these exercises. Stop right away if you feel sudden pain or your pain gets worse. Do not begin these exercises until told by your health care provider. Stretching and range-of-motion exercises These exercises warm up your muscles and joints and improve the movement and flexibility of your knee. These exercises also help to relieve pain and swelling. Knee extension, prone 1. Lie on your abdomen (prone position) on a bed. 2. Place your left / right knee just beyond the edge of the surface so your knee is not on the bed. You can put a towel under your left / right thigh just above your kneecap for comfort. 3. Relax your leg muscles and allow gravity to straighten your knee (extension). You should feel a stretch behind your left / right knee. 4. Hold this position for __________ seconds. 5. Scoot up so your knee is supported between repetitions. Repeat __________ times. Complete this exercise __________ times a day. Knee flexion, active  1. Lie on your back with both legs straight. If this causes back discomfort, bend your left / right knee so your foot is flat on the floor. 2. Slowly slide your left / right heel back toward your buttocks. Stop when you feel a gentle stretch in the front of your knee or thigh (flexion). 3. Hold this position for __________ seconds. 4. Slowly slide your left / right heel back to the starting position. Repeat __________ times. Complete this exercise __________ times a day. Quadriceps stretch, prone  1. Lie on your abdomen on a firm surface, such as a bed or padded floor. 2. Bend your left / right knee and hold  your ankle. If you cannot reach your ankle or pant leg, loop a belt around your foot and grab the belt instead. 3. Gently pull your heel toward your buttocks. Your knee should not slide out to the side. You should feel a stretch in the front of your thigh and knee (quadriceps). 4. Hold this position for __________ seconds. Repeat __________ times. Complete this exercise __________ times a day. Hamstring, supine 1. Lie on your back (supine position). 2. Loop a belt or towel over the ball of your left / right foot. The ball of your foot is on the walking surface, right under your toes. 3. Straighten your left / right knee and slowly pull on the belt to raise your leg until you feel a gentle stretch behind your knee (hamstring). ? Do not let your knee bend while you do this. ? Keep your other leg flat on the floor. 4. Hold this position for __________ seconds. Repeat __________ times. Complete this exercise __________ times a day. Strengthening exercises These exercises build strength and endurance in your knee. Endurance is the ability to use your muscles for a long time, even after they get tired. Quadriceps, isometric This exercise stretches the muscles in front of your thigh (quadriceps) without moving your knee joint (isometric). 1. Lie on your back with your left / right leg extended and your other knee bent. Put a rolled towel or small pillow under your knee if told by your health care provider. 2. Slowly tense the muscles in the front of your left /   right thigh. You should see your kneecap slide up toward your hip or see increased dimpling just above the knee. This motion will push the back of the knee toward the floor. 3. For __________ seconds, hold the muscle as tight as you can without increasing your pain. 4. Relax the muscles slowly and completely. Repeat __________ times. Complete this exercise __________ times a day. Straight leg raises This exercise stretches the muscles in front  of your thigh (quadriceps) and the muscles that move your hips (hip flexors). 1. Lie on your back with your left / right leg extended and your other knee bent. 2. Tense the muscles in the front of your left / right thigh. You should see your kneecap slide up or see increased dimpling just above the knee. Your thigh may even shake a bit. 3. Keep these muscles tight as you raise your leg 4-6 inches (10-15 cm) off the floor. Do not let your knee bend. 4. Hold this position for __________ seconds. 5. Keep these muscles tense as you lower your leg. 6. Relax your muscles slowly and completely after each repetition. Repeat __________ times. Complete this exercise __________ times a day. Hamstring, isometric 1. Lie on your back on a firm surface. 2. Bend your left / right knee about __________ degrees. 3. Dig your left / right heel into the surface as if you are trying to pull it toward your buttocks. Tighten the muscles in the back of your thighs (hamstring) to "dig" as hard as you can without increasing any pain. 4. Hold this position for __________ seconds. 5. Release the tension gradually and allow your muscles to relax completely for __________ seconds after each repetition. Repeat __________ times. Complete this exercise __________ times a day. Hamstring curls If told by your health care provider, do this exercise while wearing ankle weights. Begin with __________ lb weights. Then increase the weight by 1 lb (0.5 kg) increments. Do not wear ankle weights that are more than __________ lb. 1. Lie on your abdomen with your legs straight. 2. Bend your left / right knee as far as you can without feeling pain. Keep your hips flat against the floor. 3. Hold this position for __________ seconds. 4. Slowly lower your leg to the starting position. Repeat __________ times. Complete this exercise __________ times a day. Squats This exercise strengthens the muscles in front of your thigh and knee  (quadriceps). 1. Stand in front of a table, with your feet and knees pointing straight ahead. You may rest your hands on the table for balance but not for support. 2. Slowly bend your knees and lower your hips like you are going to sit in a chair. ? Keep your weight over your heels, not over your toes. ? Keep your lower legs upright so they are parallel with the table legs. ? Do not let your hips go lower than your knees. ? Do not bend lower than told by your health care provider. ? If your knee pain increases, do not bend as low. 3. Hold the squat position for __________ seconds. 4. Slowly push with your legs to return to standing. Do not use your hands to pull yourself to standing. Repeat __________ times. Complete this exercise __________ times a day. Wall slides This exercise strengthens the muscles in front of your thigh and knee (quadriceps). 1. Lean your back against a smooth wall or door, and walk your feet out 18-24 inches (46-61 cm) from it. 2. Place your feet hip-width apart. 3.   Slowly slide down the wall or door until your knees bend __________ degrees. Keep your knees over your heels, not over your toes. Keep your knees in line with your hips. 4. Hold this position for __________ seconds. Repeat __________ times. Complete this exercise __________ times a day. Straight leg raises This exercise strengthens the muscles that rotate the leg at the hip and move it away from your body (hip abductors). 1. Lie on your side with your left / right leg in the top position. Lie so your head, shoulder, knee, and hip line up. You may bend your bottom knee to help you keep your balance. 2. Roll your hips slightly forward so your hips are stacked directly over each other and your left / right knee is facing forward. 3. Leading with your heel, lift your top leg 4-6 inches (10-15 cm). You should feel the muscles in your outer hip lifting. ? Do not let your foot drift forward. ? Do not let your knee  roll toward the ceiling. 4. Hold this position for __________ seconds. 5. Slowly return your leg to the starting position. 6. Let your muscles relax completely after each repetition. Repeat __________ times. Complete this exercise __________ times a day. Straight leg raises This exercise stretches the muscles that move your hips away from the front of the pelvis (hip extensors). 1. Lie on your abdomen on a firm surface. You can put a pillow under your hips if that is more comfortable. 2. Tense the muscles in your buttocks and lift your left / right leg about 4-6 inches (10-15 cm). Keep your knee straight as you lift your leg. 3. Hold this position for __________ seconds. 4. Slowly lower your leg to the starting position. 5. Let your leg relax completely after each repetition. Repeat __________ times. Complete this exercise __________ times a day. This information is not intended to replace advice given to you by your health care provider. Make sure you discuss any questions you have with your health care provider. Document Released: 04/24/2005 Document Revised: 03/31/2018 Document Reviewed: 03/31/2018 [employer] Patient Education  2020 [employer] Hand Exercises Hand exercises can be helpful for almost anyone. These exercises can strengthen the hands, improve flexibility and movement, and increase blood flow to the hands. These results can make work and daily tasks easier. Hand exercises can be especially helpful for people who have joint pain from arthritis or have nerve damage from overuse (carpal tunnel syndrome). These exercises can also help people who have injured a hand. Exercises Most of these hand exercises are gentle stretching and motion exercises. It is usually safe to do them often throughout the day. Warming up your hands before exercise may help to reduce stiffness. You can do this with gentle massage or by placing your hands in warm water for 10-15 minutes. It is normal to feel  some stretching, pulling, tightness, or mild discomfort as you begin new exercises. This will gradually improve. Stop an exercise right away if you feel sudden, severe pain or your pain gets worse. Ask your health care provider which exercises are best for you. Knuckle bend or "claw" fist 6. Stand or sit with your arm, hand, and all five fingers pointed straight up. Make sure to keep your wrist straight during the exercise. 7. Gently bend your fingers down toward your palm until the tips of your fingers are touching the top of your palm. Keep your big knuckle straight and just bend the small knuckles in your fingers. 8. Hold   this position for __________ seconds. 9. Straighten (extend) your fingers back to the starting position. Repeat this exercise 5-10 times with each hand. Full finger fist 5. Stand or sit with your arm, hand, and all five fingers pointed straight up. Make sure to keep your wrist straight during the exercise. 6. Gently bend your fingers into your palm until the tips of your fingers are touching the middle of your palm. 7. Hold this position for __________ seconds. 8. Extend your fingers back to the starting position, stretching every joint fully. Repeat this exercise 5-10 times with each hand. Straight fist 5. Stand or sit with your arm, hand, and all five fingers pointed straight up. Make sure to keep your wrist straight during the exercise. 6. Gently bend your fingers at the big knuckle, where your fingers meet your hand, and the middle knuckle. Keep the knuckle at the tips of your fingers straight and try to touch the bottom of your palm. 7. Hold this position for __________ seconds. 8. Extend your fingers back to the starting position, stretching every joint fully. Repeat this exercise 5-10 times with each hand. Tabletop 1. Stand or sit with your arm, hand, and all five fingers pointed straight up. Make sure to keep your wrist straight during the exercise. 2. Gently bend  your fingers at the big knuckle, where your fingers meet your hand, as far down as you can while keeping the small knuckles in your fingers straight. Think of forming a tabletop with your fingers. 3. Hold this position for __________ seconds. 4. Extend your fingers back to the starting position, stretching every joint fully. Repeat this exercise 5-10 times with each hand. Finger spread 5. Place your hand flat on a table with your palm facing down. Make sure your wrist stays straight as you do this exercise. 6. Spread your fingers and thumb apart from each other as far as you can until you feel a gentle stretch. Hold this position for __________ seconds. 7. Bring your fingers and thumb tight together again. Hold this position for __________ seconds. Repeat this exercise 5-10 times with each hand. Making circles 7. Stand or sit with your arm, hand, and all five fingers pointed straight up. Make sure to keep your wrist straight during the exercise. 8. Make a circle by touching the tip of your thumb to the tip of your index finger. 9. Hold for __________ seconds. Then open your hand wide. 10. Repeat this motion with your thumb and each finger on your hand. Repeat this exercise 5-10 times with each hand. Thumb motion 6. Sit with your forearm resting on a table and your wrist straight. Your thumb should be facing up toward the ceiling. Keep your fingers relaxed as you move your thumb. 7. Lift your thumb up as high as you can toward the ceiling. Hold for __________ seconds. 8. Bend your thumb across your palm as far as you can, reaching the tip of your thumb for the small finger (pinkie) side of your palm. Hold for __________ seconds. Repeat this exercise 5-10 times with each hand. Grip strengthening  5. Hold a stress ball or other soft ball in the middle of your hand. 6. Slowly increase the pressure, squeezing the ball as much as you can without causing pain. Think of bringing the tips of your  fingers into the middle of your palm. All of your finger joints should bend when doing this exercise. 7. Hold your squeeze for __________ seconds, then relax. Repeat this exercise 5-10   times with each hand. Contact a health care provider if:  Your hand pain or discomfort gets much worse when you do an exercise.  Your hand pain or discomfort does not improve within 2 hours after you exercise. If you have any of these problems, stop doing these exercises right away. Do not do them again unless your health care provider says that you can. Get help right away if:  You develop sudden, severe hand pain or swelling. If this happens, stop doing these exercises right away. Do not do them again unless your health care provider says that you can. This information is not intended to replace advice given to you by your health care provider. Make sure you discuss any questions you have with your health care provider. Document Released: 05/22/2015 Document Revised: 10/01/2018 Document Reviewed: 06/11/2018 Else[employer] Patient Education  2020 [employer]  

## 2019-02-18 NOTE — Progress Notes (Signed)

## 2019-02-18 NOTE — Progress Notes (Signed)
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 - SEE MICROSCOPIC DESCRIPTION  Receptor Status: ER(100%), PR (90%), Her2-neu (NEG), Ki-(10%)  02/04/19 Diagnosis 1. Breast, lumpectomy, Right w/seed - INVASIVE DUCTAL CARCINOMA GRADE I/III, SPANNING 0.6 CM. - DUCTAL CARCINOMA IN SITU, LOW GRADE. - INVASIVE CARCINOMA IS BROADLY PRESENT AT THE ANTERIOR MARGIN OF SPECIMEN 1. - SEE ONCOLOGY TABLE BELOW. 2. Breast, excision, Right Medial Margin - BENIGN BREAST PARENCHYMA. - THERE IS NO EVIDENCE OF MALIGNANCY. - SEE COMMENT. 3. Breast, excision, Right Inferior Margin - BENIGN BREAST PARENCHYMA. - THERE IS NO EVIDENCE OF MALIGNANCY. - SEE COMMENT. 4. Breast, excision, Right Lateral Margin - BENIGN BREAST PARENCHYMA. - THERE IS NO EVIDENCE OF MALIGNANCY. - SEE COMMENT. 5. Lymph node, sentinel, biopsy, Right Axillary - THERE IS NO EVIDENCE OF CARCINOMA IN 1 OF 1 LYMPH NODE (0/1). 6. Lymph node, sentinel, biopsy, Right Axillary - THERE IS NO EVIDENCE OF CARCINOMA IN 1 OF 1 LYMPH NODE (0/1). 7. Lymph node, sentinel, biopsy, Right Axillary - THERE IS NO EVIDENCE OF CARCINOMA IN 1 OF 1 LYMPH NODE (0/1).  02/23/19 Procedure: Rightbreast re-excision lumpectomy Surgeon: Dr. Serita Grammes  Did patient present with symptoms or was this found on screening mammography?: It was found on a screening mammogram.   Past/Anticipated interventions by surgeon, if any: 02/04/19 Procedure: 1. Rightbreast radioactive seed guided lumpectomy 2. Right deepaxillary sentinel lymph node biopsy Surgeon: Dr. Serita Grammes  Past/Anticipated interventions by medical oncology, if any:  01/28/19 Dr. Jana Hakim (1) definitive surgery pending  ++ completed 02/04/19 (2) no Oncotype planned unless tumor greater than 1.0 cm (3) adjuvant radiation to  follow (4) to start antiestrogens at the completion of local treatment  Lymphedema issues, if any:  She did have a seroma drained yesterday by Dr. Donne Hazel  Pain issues, if any:  She denies.   SAFETY ISSUES:  Prior radiation? No  Pacemaker/ICD? No  Possible current pregnancy? No  Is the patient on methotrexate? No  Current Complaints / other details:      Gloria Frazier, Stephani Police, RN 02/18/2019,8:15 AM

## 2019-02-19 ENCOUNTER — Other Ambulatory Visit (HOSPITAL_COMMUNITY)
Admission: RE | Admit: 2019-02-19 | Discharge: 2019-02-19 | Disposition: A | Discharge status: Home / Self Care | Payer: 59 | Source: Ambulatory Visit | Attending: General Surgery | Admitting: General Surgery

## 2019-02-19 DIAGNOSIS — Z01812 Encounter for preprocedural laboratory examination: Secondary | ICD-10-CM | POA: Insufficient documentation

## 2019-02-19 DIAGNOSIS — Z20828 Contact with and (suspected) exposure to other viral communicable diseases: Secondary | ICD-10-CM | POA: Diagnosis not present

## 2019-02-19 LAB — SARS CORONAVIRUS 2 (TAT 6-24 HRS): SARS Coronavirus 2: NEGATIVE

## 2019-02-22 ENCOUNTER — Other Ambulatory Visit: Payer: Self-pay | Admitting: General Surgery

## 2019-02-23 ENCOUNTER — Ambulatory Visit (HOSPITAL_BASED_OUTPATIENT_CLINIC_OR_DEPARTMENT_OTHER): Payer: 59 | Admitting: Certified Registered"

## 2019-02-23 ENCOUNTER — Encounter (HOSPITAL_BASED_OUTPATIENT_CLINIC_OR_DEPARTMENT_OTHER)
Admission: RE | Disposition: A | Discharge status: Home / Self Care | Payer: Self-pay | Source: Home / Self Care | Attending: General Surgery

## 2019-02-23 ENCOUNTER — Ambulatory Visit (HOSPITAL_BASED_OUTPATIENT_CLINIC_OR_DEPARTMENT_OTHER)
Admission: RE | Admit: 2019-02-23 | Discharge: 2019-02-23 | Disposition: A | Discharge status: Home / Self Care | Payer: 59 | Attending: General Surgery | Admitting: General Surgery

## 2019-02-23 ENCOUNTER — Telehealth: Payer: Self-pay | Admitting: Radiation Oncology

## 2019-02-23 ENCOUNTER — Other Ambulatory Visit: Payer: Self-pay

## 2019-02-23 ENCOUNTER — Encounter (HOSPITAL_BASED_OUTPATIENT_CLINIC_OR_DEPARTMENT_OTHER): Payer: Self-pay

## 2019-02-23 DIAGNOSIS — Z818 Family history of other mental and behavioral disorders: Secondary | ICD-10-CM | POA: Insufficient documentation

## 2019-02-23 DIAGNOSIS — Z8349 Family history of other endocrine, nutritional and metabolic diseases: Secondary | ICD-10-CM | POA: Insufficient documentation

## 2019-02-23 DIAGNOSIS — C50411 Malignant neoplasm of upper-outer quadrant of right female breast: Secondary | ICD-10-CM | POA: Diagnosis not present

## 2019-02-23 DIAGNOSIS — Z86010 Personal history of colonic polyps: Secondary | ICD-10-CM | POA: Insufficient documentation

## 2019-02-23 DIAGNOSIS — K449 Diaphragmatic hernia without obstruction or gangrene: Secondary | ICD-10-CM | POA: Insufficient documentation

## 2019-02-23 DIAGNOSIS — Z79899 Other long term (current) drug therapy: Secondary | ICD-10-CM | POA: Diagnosis not present

## 2019-02-23 DIAGNOSIS — Z833 Family history of diabetes mellitus: Secondary | ICD-10-CM | POA: Diagnosis not present

## 2019-02-23 DIAGNOSIS — K5790 Diverticulosis of intestine, part unspecified, without perforation or abscess without bleeding: Secondary | ICD-10-CM | POA: Diagnosis not present

## 2019-02-23 DIAGNOSIS — M1990 Unspecified osteoarthritis, unspecified site: Secondary | ICD-10-CM | POA: Insufficient documentation

## 2019-02-23 DIAGNOSIS — Z809 Family history of malignant neoplasm, unspecified: Secondary | ICD-10-CM | POA: Insufficient documentation

## 2019-02-23 DIAGNOSIS — K219 Gastro-esophageal reflux disease without esophagitis: Secondary | ICD-10-CM | POA: Insufficient documentation

## 2019-02-23 DIAGNOSIS — K649 Unspecified hemorrhoids: Secondary | ICD-10-CM | POA: Diagnosis not present

## 2019-02-23 DIAGNOSIS — Z823 Family history of stroke: Secondary | ICD-10-CM | POA: Insufficient documentation

## 2019-02-23 DIAGNOSIS — Z811 Family history of alcohol abuse and dependence: Secondary | ICD-10-CM | POA: Insufficient documentation

## 2019-02-23 DIAGNOSIS — Z8261 Family history of arthritis: Secondary | ICD-10-CM | POA: Diagnosis not present

## 2019-02-23 DIAGNOSIS — Z836 Family history of other diseases of the respiratory system: Secondary | ICD-10-CM | POA: Diagnosis not present

## 2019-02-23 DIAGNOSIS — F909 Attention-deficit hyperactivity disorder, unspecified type: Secondary | ICD-10-CM | POA: Insufficient documentation

## 2019-02-23 DIAGNOSIS — Z90710 Acquired absence of both cervix and uterus: Secondary | ICD-10-CM | POA: Diagnosis not present

## 2019-02-23 DIAGNOSIS — C50911 Malignant neoplasm of unspecified site of right female breast: Secondary | ICD-10-CM | POA: Diagnosis present

## 2019-02-23 DIAGNOSIS — Z8249 Family history of ischemic heart disease and other diseases of the circulatory system: Secondary | ICD-10-CM | POA: Diagnosis not present

## 2019-02-23 DIAGNOSIS — F419 Anxiety disorder, unspecified: Secondary | ICD-10-CM | POA: Insufficient documentation

## 2019-02-23 HISTORY — PX: RE-EXCISION OF BREAST LUMPECTOMY: SHX6048

## 2019-02-23 HISTORY — PX: INCISION AND DRAINAGE: SHX5863

## 2019-02-23 SURGERY — EXCISION, LESION, BREAST
Anesthesia: General | Site: Axilla | Laterality: Right

## 2019-02-23 MED ORDER — CHLORHEXIDINE GLUCONATE CLOTH 2 % EX PADS: 6.0000 | MEDICATED_PAD | Freq: Once | CUTANEOUS | Status: DC

## 2019-02-23 MED ORDER — LACTATED RINGERS IV SOLN: INTRAVENOUS | Status: DC

## 2019-02-23 MED ORDER — LIDOCAINE 2% (20 MG/ML) 5 ML SYRINGE
INTRAMUSCULAR | Status: AC
  Filled 2019-02-23: qty 5

## 2019-02-23 MED ORDER — ACETAMINOPHEN 500 MG PO TABS
ORAL_TABLET | ORAL | Status: AC
  Filled 2019-02-23: qty 2

## 2019-02-23 MED ORDER — MEPERIDINE HCL 25 MG/ML IJ SOLN: 6.2500 mg | INTRAMUSCULAR | Status: DC | PRN

## 2019-02-23 MED ORDER — CEFAZOLIN SODIUM-DEXTROSE 2-4 GM/100ML-% IV SOLN
2.0000 g | INTRAVENOUS | Status: AC
  Administered 2019-02-23 (×2): 2 g via INTRAVENOUS

## 2019-02-23 MED ORDER — LACTATED RINGERS IV SOLN
INTRAVENOUS | Status: DC
  Administered 2019-02-23: 14:00:00 via INTRAVENOUS

## 2019-02-23 MED ORDER — LIDOCAINE 2% (20 MG/ML) 5 ML SYRINGE
INTRAMUSCULAR | Status: DC | PRN
  Administered 2019-02-23: 50 mg via INTRAVENOUS

## 2019-02-23 MED ORDER — DEXAMETHASONE SODIUM PHOSPHATE 10 MG/ML IJ SOLN
INTRAMUSCULAR | Status: AC
  Filled 2019-02-23: qty 1

## 2019-02-23 MED ORDER — CEFAZOLIN SODIUM-DEXTROSE 2-4 GM/100ML-% IV SOLN
INTRAVENOUS | Status: AC
  Filled 2019-02-23: qty 100

## 2019-02-23 MED ORDER — FENTANYL CITRATE (PF) 100 MCG/2ML IJ SOLN
INTRAMUSCULAR | Status: AC
  Filled 2019-02-23: qty 2

## 2019-02-23 MED ORDER — METOCLOPRAMIDE HCL 5 MG/ML IJ SOLN: 10.0000 mg | Freq: Once | INTRAMUSCULAR | Status: DC | PRN

## 2019-02-23 MED ORDER — GABAPENTIN 100 MG PO CAPS
100.0000 mg | ORAL_CAPSULE | ORAL | Status: AC
  Administered 2019-02-23: 100 mg via ORAL

## 2019-02-23 MED ORDER — FENTANYL CITRATE (PF) 100 MCG/2ML IJ SOLN: 25.0000 ug | INTRAMUSCULAR | Status: DC | PRN

## 2019-02-23 MED ORDER — BUPIVACAINE HCL (PF) 0.25 % IJ SOLN
INTRAMUSCULAR | Status: DC | PRN
  Administered 2019-02-23: 10 mL

## 2019-02-23 MED ORDER — MIDAZOLAM HCL 2 MG/2ML IJ SOLN
1.0000 mg | INTRAMUSCULAR | Status: DC | PRN
  Administered 2019-02-23: 14:00:00 2 mg via INTRAVENOUS

## 2019-02-23 MED ORDER — OXYCODONE HCL 5 MG PO TABS
ORAL_TABLET | ORAL | Status: AC
  Filled 2019-02-23: qty 1

## 2019-02-23 MED ORDER — GABAPENTIN 100 MG PO CAPS
ORAL_CAPSULE | ORAL | Status: AC
  Filled 2019-02-23: qty 1

## 2019-02-23 MED ORDER — PROPOFOL 10 MG/ML IV BOLUS
INTRAVENOUS | Status: DC | PRN
  Administered 2019-02-23: 150 mg via INTRAVENOUS

## 2019-02-23 MED ORDER — PROPOFOL 500 MG/50ML IV EMUL
INTRAVENOUS | Status: AC
  Filled 2019-02-23: qty 50

## 2019-02-23 MED ORDER — ONDANSETRON HCL 4 MG/2ML IJ SOLN
INTRAMUSCULAR | Status: AC
  Filled 2019-02-23: qty 2

## 2019-02-23 MED ORDER — OXYCODONE HCL 5 MG PO TABS
5.0000 mg | ORAL_TABLET | Freq: Once | ORAL | Status: AC
  Administered 2019-02-23: 16:00:00 5 mg via ORAL

## 2019-02-23 MED ORDER — FENTANYL CITRATE (PF) 100 MCG/2ML IJ SOLN
100.0000 ug | INTRAMUSCULAR | Status: DC | PRN
  Administered 2019-02-23 (×2): 50 ug via INTRAVENOUS

## 2019-02-23 MED ORDER — PHENYLEPHRINE 40 MCG/ML (10ML) SYRINGE FOR IV PUSH (FOR BLOOD PRESSURE SUPPORT)
PREFILLED_SYRINGE | INTRAVENOUS | Status: AC
  Filled 2019-02-23: qty 10

## 2019-02-23 MED ORDER — DEXAMETHASONE SODIUM PHOSPHATE 4 MG/ML IJ SOLN
INTRAMUSCULAR | Status: DC | PRN
  Administered 2019-02-23: 10 mg via INTRAVENOUS

## 2019-02-23 MED ORDER — ACETAMINOPHEN 500 MG PO TABS
1000.0000 mg | ORAL_TABLET | ORAL | Status: AC
  Administered 2019-02-23: 1000 mg via ORAL

## 2019-02-23 MED ORDER — EPHEDRINE 5 MG/ML INJ
INTRAVENOUS | Status: AC
  Filled 2019-02-23: qty 10

## 2019-02-23 MED ORDER — ONDANSETRON HCL 4 MG/2ML IJ SOLN
INTRAMUSCULAR | Status: DC | PRN
  Administered 2019-02-23: 4 mg via INTRAVENOUS

## 2019-02-23 MED ORDER — MIDAZOLAM HCL 2 MG/2ML IJ SOLN
INTRAMUSCULAR | Status: AC
  Filled 2019-02-23: qty 2

## 2019-02-23 MED ORDER — SUCCINYLCHOLINE CHLORIDE 200 MG/10ML IV SOSY
PREFILLED_SYRINGE | INTRAVENOUS | Status: AC
  Filled 2019-02-23: qty 10

## 2019-02-23 SURGICAL SUPPLY — 55 items
ADH SKN CLS APL DERMABOND .7 (GAUZE/BANDAGES/DRESSINGS)
APL PRP STRL LF DISP 70% ISPRP (MISCELLANEOUS) ×2
BINDER BREAST LRG (GAUZE/BANDAGES/DRESSINGS) IMPLANT
BINDER BREAST MEDIUM (GAUZE/BANDAGES/DRESSINGS) IMPLANT
BINDER BREAST XLRG (GAUZE/BANDAGES/DRESSINGS) IMPLANT
BINDER BREAST XXLRG (GAUZE/BANDAGES/DRESSINGS) IMPLANT
BLADE SURG 15 STRL LF DISP TIS (BLADE) ×2 IMPLANT
BLADE SURG 15 STRL SS (BLADE) ×3
CANISTER SUCT 1200ML W/VALVE (MISCELLANEOUS) ×3 IMPLANT
CHLORAPREP W/TINT 26 (MISCELLANEOUS) ×3 IMPLANT
CLIP VESOCCLUDE SM WIDE 6/CT (CLIP) IMPLANT
COVER BACK TABLE REUSABLE LG (DRAPES) ×3 IMPLANT
COVER MAYO STAND REUSABLE (DRAPES) ×3 IMPLANT
COVER WAND RF STERILE (DRAPES) IMPLANT
DECANTER SPIKE VIAL GLASS SM (MISCELLANEOUS) IMPLANT
DERMABOND ADVANCED (GAUZE/BANDAGES/DRESSINGS)
DERMABOND ADVANCED .7 DNX12 (GAUZE/BANDAGES/DRESSINGS) IMPLANT
DRAPE LAPAROSCOPIC ABDOMINAL (DRAPES) ×3 IMPLANT
DRAPE UTILITY XL STRL (DRAPES) ×3 IMPLANT
DRSG TEGADERM 4X4.75 (GAUZE/BANDAGES/DRESSINGS) ×3 IMPLANT
ELECT COATED BLADE 2.86 ST (ELECTRODE) ×3 IMPLANT
ELECT REM PT RETURN 9FT ADLT (ELECTROSURGICAL) ×3
ELECTRODE REM PT RTRN 9FT ADLT (ELECTROSURGICAL) ×2 IMPLANT
GAUZE SPONGE 4X4 12PLY STRL LF (GAUZE/BANDAGES/DRESSINGS) ×3 IMPLANT
GLOVE BIO SURGEON STRL SZ7 (GLOVE) ×3 IMPLANT
GLOVE BIOGEL PI IND STRL 7.5 (GLOVE) ×2 IMPLANT
GLOVE BIOGEL PI INDICATOR 7.5 (GLOVE) ×1
GOWN STRL REUS W/ TWL LRG LVL3 (GOWN DISPOSABLE) ×6 IMPLANT
GOWN STRL REUS W/TWL LRG LVL3 (GOWN DISPOSABLE) ×9
HEMOSTAT ARISTA ABSORB 3G PWDR (HEMOSTASIS) IMPLANT
ILLUMINATOR WAVEGUIDE N/F (MISCELLANEOUS) IMPLANT
KIT MARKER MARGIN INK (KITS) IMPLANT
LIGHT WAVEGUIDE WIDE FLAT (MISCELLANEOUS) IMPLANT
NDL HYPO 25X1 1.5 SAFETY (NEEDLE) ×2 IMPLANT
NEEDLE HYPO 25X1 1.5 SAFETY (NEEDLE) ×3 IMPLANT
NS IRRIG 1000ML POUR BTL (IV SOLUTION) IMPLANT
PACK BASIN DAY SURGERY FS (CUSTOM PROCEDURE TRAY) ×3 IMPLANT
PENCIL SMOKE EVACUATOR (MISCELLANEOUS) ×3 IMPLANT
SLEEVE SCD COMPRESS KNEE MED (MISCELLANEOUS) ×3 IMPLANT
SPONGE LAP 4X18 RFD (DISPOSABLE) ×3 IMPLANT
STRIP CLOSURE SKIN 1/2X4 (GAUZE/BANDAGES/DRESSINGS) IMPLANT
SUT MNCRL AB 3-0 PS2 18 (SUTURE) IMPLANT
SUT MNCRL AB 4-0 PS2 18 (SUTURE) IMPLANT
SUT MON AB 5-0 PS2 18 (SUTURE) IMPLANT
SUT SILK 2 0 SH (SUTURE) IMPLANT
SUT VIC AB 2-0 SH 27 (SUTURE) ×3
SUT VIC AB 2-0 SH 27XBRD (SUTURE) ×2 IMPLANT
SUT VIC AB 3-0 SH 27 (SUTURE) ×3
SUT VIC AB 3-0 SH 27X BRD (SUTURE) ×2 IMPLANT
SUT VIC AB 5-0 PS2 18 (SUTURE) IMPLANT
SUT VICRYL AB 3 0 TIES (SUTURE) IMPLANT
SYR CONTROL 10ML LL (SYRINGE) ×3 IMPLANT
TOWEL GREEN STERILE FF (TOWEL DISPOSABLE) ×3 IMPLANT
TUBE CONNECTING 20X1/4 (TUBING) ×3 IMPLANT
YANKAUER SUCT BULB TIP NO VENT (SUCTIONS) ×3 IMPLANT

## 2019-02-23 NOTE — Transfer of Care (Signed)
Immediate Anesthesia Transfer of Care Note  Patient: Gloria Frazier  Procedure(s) Performed: RE-EXCISION OF RIGHT BREAST MARGIN (N/A ) INCISION AND DRAINAGE (Right Axilla)  Patient Location: PACU  Anesthesia Type:General  Level of Consciousness: awake, alert  and oriented  Airway & Oxygen Therapy: Patient Spontanous Breathing and Patient connected to face mask oxygen  Post-op Assessment: Report given to RN and Post -op Vital signs reviewed and stable  Post vital signs: Reviewed and stable  Last Vitals:  Vitals Value Taken Time  BP    Temp    Pulse    Resp    SpO2      Last Pain:  Vitals:   02/23/19 1338  TempSrc: Oral  PainSc: 0-No pain      Patients Stated Pain Goal: 4 ([id number])  Complications: No apparent anesthesia complications

## 2019-02-23 NOTE — Anesthesia Preprocedure Evaluation (Signed)
Anesthesia Evaluation  Patient identified by MRN, date of birth, ID band Patient awake    Reviewed: Allergy & Precautions, H&P , NPO status , Patient's Chart, lab work & pertinent test results  Airway Mallampati: II   Neck ROM: full    Dental   Pulmonary neg pulmonary ROS,    breath sounds clear to auscultation       Cardiovascular negative cardio ROS   Rhythm:regular Rate:Normal     Neuro/Psych PSYCHIATRIC DISORDERS Anxiety ADHD   GI/Hepatic hiatal hernia, GERD  ,  Endo/Other    Renal/GU      Musculoskeletal  (+) Arthritis ,   Abdominal   Peds  Hematology   Anesthesia Other Findings   Reproductive/Obstetrics                             Anesthesia Physical  Anesthesia Plan  ASA: II  Anesthesia Plan: General   Post-op Pain Management:    Induction: Intravenous  PONV Risk Score and Plan: 3 and Ondansetron, Dexamethasone, Treatment may vary due to age or medical condition and Midazolam  Airway Management Planned: LMA  Additional Equipment:   Intra-op Plan:   Post-operative Plan:   Informed Consent: I have reviewed the patients History and Physical, chart, labs and discussed the procedure including the risks, benefits and alternatives for the proposed anesthesia with the patient or authorized representative who has indicated his/her understanding and acceptance.     Dental advisory given  Plan Discussed with: CRNA, Anesthesiologist and Surgeon  Anesthesia Plan Comments:         Anesthesia Quick Evaluation

## 2019-02-23 NOTE — Discharge Instructions (Signed)
[place] Office Phone Number [phone]  BREAST BIOPSY/ PARTIAL MASTECTOMY: POST OP INSTRUCTIONS Take 400 mg of ibuprofen every 8 hours or 650 mg tylenol every 6 hours for next 72 hours then as needed. Use ice several times daily also. Always review your discharge instruction sheet given to you by the facility where your surgery was performed.  IF YOU HAVE DISABILITY OR FAMILY LEAVE FORMS, YOU MUST BRING THEM TO THE OFFICE FOR PROCESSING.  DO NOT GIVE THEM TO YOUR DOCTOR.  1. A prescription for pain medication may be given to you upon discharge.  Take your pain medication as prescribed, if needed.  If narcotic pain medicine is not needed, then you may take acetaminophen (Tylenol), naprosyn (Alleve) or ibuprofen (Advil) as needed. 2. Take your usually prescribed medications unless otherwise directed 3. If you need a refill on your pain medication, please contact your pharmacy.  They will contact our office to request authorization.  Prescriptions will not be filled after 5pm or on week-ends. 4. You should eat very light the first 24 hours after surgery, such as soup, crackers, pudding, etc.  Resume your normal diet the day after surgery. 5. Most patients will experience some swelling and bruising in the breast.  Ice packs and a good support bra will help.  Wear the breast binder provided or a sports bra for 72 hours day and night.  After that wear a sports bra during the day until you return to the office. Swelling and bruising can take several days to resolve.  6. It is common to experience some constipation if taking pain medication after surgery.  Increasing fluid intake and taking a stool softener will usually help or prevent this problem from occurring.  A mild laxative (Milk of Magnesia or Miralax) should be taken according to package directions if there are no bowel movements after 48 hours. 7. Unless discharge instructions indicate otherwise, you may remove your bandages 48  hours after surgery and you may shower at that time.  You may have steri-strips (small skin tapes) in place directly over the incision.  These strips should be left on the skin for 7-10 days and will come off on their own.  If your surgeon used skin glue on the incision, you may shower in 24 hours.  The glue will flake off over the next 2-3 weeks.  Any sutures or staples will be removed at the office during your follow-up visit. 8. ACTIVITIES:  You may resume regular daily activities (gradually increasing) beginning the next day.  Wearing a good support bra or sports bra minimizes pain and swelling.  You may have sexual intercourse when it is comfortable. a. You may drive when you no longer are taking prescription pain medication, you can comfortably wear a seatbelt, and you can safely maneuver your car and apply brakes. b. RETURN TO WORK:  ______________________________________________________________________________________ 9. You should see your doctor in the office for a follow-up appointment approximately two weeks after your surgery.  Your doctors nurse will typically make your follow-up appointment when she calls you with your pathology report.  Expect your pathology report 3-4 business days after your surgery.  You may call to check if you do not hear from [place] after three days. 10. OTHER INSTRUCTIONS: _______________________________________________________________________________________________ _____________________________________________________________________________________________________________________________________ _____________________________________________________________________________________________________________________________________ _____________________________________________________________________________________________________________________________________  WHEN TO CALL DR WAKEFIELD: 1. Fever over 101.0 2. Nausea and/or vomiting. 3. Extreme swelling or  bruising. 4. Continued bleeding from incision. 5. Increased pain, redness, or drainage from the incision.  The clinic  staff is available to answer your questions during regular business hours.  Please don't hesitate to call and ask to speak to one of the nurses for clinical concerns.  If you have a medical emergency, go to the nearest emergency room or call 911.  A surgeon from [facility] is always on call at the hospital.  For further questions, please visit [email or web address]   Post Anesthesia Home Care Instructions  Activity: Get plenty of rest for the remainder of the day. A responsible individual must stay with you for 24 hours following the procedure.  For the next 24 hours, DO NOT: -Drive a car -Operate machinery -Drink alcoholic beverages -Take any medication unless instructed by your physician -Make any legal decisions or sign important papers.  Meals: Start with liquid foods such as gelatin or soup. Progress to regular foods as tolerated. Avoid greasy, spicy, heavy foods. If nausea and/or vomiting occur, drink only clear liquids until the nausea and/or vomiting subsides. Call your physician if vomiting continues.  Special Instructions/Symptoms: Your throat may feel dry or sore from the anesthesia or the breathing tube placed in your throat during surgery. If this causes discomfort, gargle with warm salt water. The discomfort should disappear within 24 hours.  If you had a scopolamine patch placed behind your ear for the management of post- operative nausea and/or vomiting:  1. The medication in the patch is effective for 72 hours, after which it should be removed.  Wrap patch in a tissue and discard in the trash. Wash hands thoroughly with soap and water. 2. You may remove the patch earlier than 72 hours if you experience unpleasant side effects which may include dry mouth, dizziness or visual disturbances. 3. Avoid touching the patch. Wash your hands  with soap and water after contact with the patch.      

## 2019-02-23 NOTE — Anesthesia Procedure Notes (Signed)
Procedure Name: LMA Insertion Date/Time: 02/23/2019 2:15 PM Performed by: Willa Frater, CRNA Pre-anesthesia Checklist: Patient identified, Emergency Drugs available, Suction available and Patient being monitored Patient Re-evaluated:Patient Re-evaluated prior to induction Oxygen Delivery Method: Circle system utilized Preoxygenation: Pre-oxygenation with 100% oxygen Induction Type: IV induction Ventilation: Mask ventilation without difficulty LMA: LMA inserted LMA Size: 4.0 Number of attempts: 1 Airway Equipment and Method: Bite block Placement Confirmation: positive ETCO2 Tube secured with: Tape Dental Injury: Teeth and Oropharynx as per pre-operative assessment

## 2019-02-23 NOTE — Anesthesia Postprocedure Evaluation (Signed)
Anesthesia Post Note  Patient: Gloria Frazier  Procedure(s) Performed: RE-EXCISION OF RIGHT BREAST MARGIN (N/A ) INCISION AND DRAINAGE (Right Axilla)     Patient location during evaluation: PACU Anesthesia Type: General Level of consciousness: awake and alert Pain management: pain level controlled Vital Signs Assessment: post-procedure vital signs reviewed and stable Respiratory status: spontaneous breathing, nonlabored ventilation, respiratory function stable and patient connected to nasal cannula oxygen Cardiovascular status: blood pressure returned to baseline and stable Postop Assessment: no apparent nausea or vomiting Anesthetic complications: no    Last Vitals:  Vitals:   02/23/19 1500 02/23/19 1515  BP: 122/70 133/60  Pulse: 69 64  Resp: 12 15  Temp: 36.6 C   SpO2: 100% 100%    Last Pain:  Vitals:   02/23/19 1515  TempSrc:   PainSc: 3                  Montez Hageman

## 2019-02-23 NOTE — Interval H&P Note (Signed)
History and Physical Interval Note:  02/23/2019 2:08 PM  Gloria Frazier  has presented today for surgery, with the diagnosis of breast cancer.  The various methods of treatment have been discussed with the patient and family. After consideration of risks, benefits and other options for treatment, the patient has consented to  Procedure(s): RE-EXCISION OF RIGHT BREAST MARGIN (N/A) as a surgical intervention.  The patient's history has been reviewed, patient examined, no change in status, stable for surgery.  I have reviewed the patient's chart and labs.  Questions were answered to the patient's satisfaction.     Rolm Bookbinder

## 2019-02-23 NOTE — Progress Notes (Signed)
Radiation Oncology         [phone] ________________________________  Initial outpatient WebEx Consultation due to pandemic precautions   Name: Gloria Frazier MRN: [id number]  Date: 02/24/2019  DOB: 1955-07-12  [name], [name], MD  Magrinat, [name], MD   REFERRING PHYSICIAN: Magrinat, [name], MD  DIAGNOSIS:    ICD-10-CM   1. Malignant neoplasm of upper-outer quadrant of right breast in female, estrogen receptor positive ([place])  C50.411    Z17.0    Cancer Staging Malignant neoplasm of upper-outer quadrant of right breast in female, estrogen receptor positive ([place]) Staging form: Breast, AJCC 8th Edition - Pathologic stage from 02/04/2019: Stage IA (pT1b, pN0, cM0, G1, ER+, PR+, HER2-) - Signed by Gardenia Phlegm, NP on 02/24/2019    CHIEF COMPLAINT: Here to discuss management of right breast cancer  HISTORY OF PRESENT ILLNESS::Gloria Frazier is a 63 y.o. female who presented with breast abnormality on the following imaging: routine screening mammogram on the date of 12/29/2018.  Symptoms, if any, at that time, were: none.   Ultrasound of breast on 01/06/2019 revealed suspicious 6 mm mass at 11:30 in the right breast, with no suspicious lymphadenopathy in the right axilla.   Biopsy on date of 01/06/2019 showed invasive ductal carcinoma and DCIS.  ER status: 100% positive; PR status 90% positive, Her2 status negative; Grade 1-2.  Her case was presented at the multidisciplinary breast cancer conference on 01/13/2019. At the time, Oncotype was recommended on the final surgical sample. Per Dr. [name] note on 01/28/2019, she will not need an Oncotype unless the tumor ends up being larger than 1 cm.  She proceeded to right breast lumpectomy with sentinel lymph node biopsy on 02/04/2019 with pathology report revealing: tumor size of 0.6 cm; histology of ductal carcinoma; margin status to invasive disease of positive broadly to anterior margin and margin status to in situ disease  of greater than 0.2 cm; nodal status of negative (0/3); grade 1. Prognostic panel was not repeated.  She also underwent further re-excision of the positive margin yesterday, 02/23/2019, with pathology pending.  We discussed her tumor board this morning and Dr. Donne Hazel is confident that her margin is clear now.  She lives in [place].  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   PREVIOUS RADIATION THERAPY: No  PAST MEDICAL HISTORY:  has a past medical history of ADHD (attention deficit hyperactivity disorder), Anxiety, Cancer ([place]), Chicken pox, Complication of anesthesia, Diverticula of colon, GERD (gastroesophageal reflux disease), History of colon polyps, History of hiatal hernia (06/04/2005), History of nasal polyp, Internal hemorrhoids, and Osteoarthritis.    PAST SURGICAL HISTORY: Past Surgical History:  Procedure Laterality Date   ABDOMINAL HYSTERECTOMY     BREAST LUMPECTOMY WITH RADIOACTIVE SEED AND SENTINEL LYMPH NODE BIOPSY Right 02/04/2019   Procedure: RIGHT BREAST LUMPECTOMY WITH RADIOACTIVE SEED AND RIGHT AXILLARY SENTINEL LYMPH NODE BIOPSY;  Surgeon: Rolm Bookbinder, MD;  Location: [facility];  Service: General;  Laterality: Right;   BUNIONECTOMY Bilateral aug 2013, dec 2013   COLONOSCOPY  2009   Medoff- tics and hems    COLONOSCOPY W/ POLYPECTOMY  10/28/2017   INCISION AND DRAINAGE Right 02/23/2019   Procedure: INCISION AND DRAINAGE;  Surgeon: Rolm Bookbinder, MD;  Location: [facility];  Service: General;  Laterality: Right;  Drainage of seroma   RE-EXCISION OF BREAST LUMPECTOMY N/A 02/23/2019   Procedure: RE-EXCISION OF RIGHT BREAST MARGIN;  Surgeon: Rolm Bookbinder, MD;  Location: [facility];  Service:  General;  Laterality: N/A;   TOTAL HIP ARTHROPLASTY Right 12/31/2017   Procedure: RIGHT TOTAL HIP ARTHROPLASTY ANTERIOR APPROACH;  Surgeon: Gaynelle Arabian, MD;  Location: [facility];  Service: Orthopedics;  Laterality: Right;    FAMILY  HISTORY: family history is not on file.  SOCIAL HISTORY:  reports that she has never smoked. She has never used smokeless tobacco. She reports previous alcohol use. She reports that she does not use drugs.  ALLERGIES: Patient has no known allergies.  MEDICATIONS:  Current Outpatient Medications  Medication Sig Dispense Refill   diclofenac sodium (VOLTAREN) 1 % GEL Apply 2 g to 4 g to affected joint up to 4 times daily PRN (Patient taking differently: Apply 1 application topically daily. ) 4 Tube 2   meloxicam (MOBIC) 7.5 MG tablet Take 1 tablet (7.5 mg) by mouth BID PRN. 60 tablet 2   Multiple Vitamin (MULTIVITAMIN) tablet Take 5 tablets by mouth daily.      ALPRAZolam (XANAX) 0.25 MG tablet Take 0.5 mg by mouth at bedtime as needed for anxiety.     methylphenidate (RITALIN) 10 MG tablet Take 10 mg by mouth 2 (two) times daily.     methylphenidate 18 MG PO CR tablet Take 18 mg by mouth daily.     zolpidem (AMBIEN) 5 MG tablet Take 5 mg by mouth at bedtime as needed for sleep.     No current facility-administered medications for this encounter.     REVIEW OF SYSTEMS: As above  PHYSICAL EXAM:  vitals were not taken for this visit.   NAD    LABORATORY DATA:  Lab Results  Component Value Date   WBC 6.8 01/29/2019   HGB 14.1 01/29/2019   HCT 43.7 01/29/2019   MCV 93.8 01/29/2019   PLT 318 01/29/2019   CMP     Component Value Date/Time   NA 141 01/29/2019 0813   K 4.2 01/29/2019 0813   CL 106 01/29/2019 0813   CO2 26 01/29/2019 0813   GLUCOSE 92 01/29/2019 0813   BUN 15 01/29/2019 0813   CREATININE 0.77 01/29/2019 0813   CREATININE 0.80 02/03/2017 0850   CALCIUM 9.2 01/29/2019 0813   PROT 6.7 07/03/2018 0831   ALBUMIN 4.1 07/03/2018 0831   AST 27 07/03/2018 0831   ALT 27 07/03/2018 0831   ALKPHOS 61 07/03/2018 0831   BILITOT 0.5 07/03/2018 0831   GFRNONAA >60 01/29/2019 0813   GFRNONAA 79 02/03/2017 0850   GFRAA >60 01/29/2019 0813   GFRAA >89 02/03/2017 0850          RADIOGRAPHY: Nm Sentinel Node Inj-no Rpt (breast)  Result Date: 02/04/2019 Sulfur colloid was injected by the nuclear medicine technologist for melanoma sentinel node.   Mm Breast Surgical Specimen  Result Date: 02/04/2019 CLINICAL DATA:  Status post surgical excision of a right breast lesion following radioactive seed localization. EXAM: SPECIMEN RADIOGRAPH OF THE RIGHT BREAST COMPARISON:  Previous exam(s). FINDINGS: Status post excision of the right breast. The radioactive seed and biopsy marker clip are present, completely intact, and were marked for pathology. IMPRESSION: Specimen radiograph of the right breast. Electronically Signed   By: Lajean Manes M.D.   On: 02/04/2019 08:20   Mm Rt Radioactive Seed Loc Mammo Guide  Result Date: 02/03/2019 CLINICAL DATA:  Localization of the patient's known right breast cancer. EXAM: MAMMOGRAPHIC GUIDED RADIOACTIVE SEED LOCALIZATION OF THE RIGHT BREAST COMPARISON:  Previous exam(s). FINDINGS: Patient presents for radioactive seed localization prior to surgery. I met with the patient and we  discussed the procedure of seed localization including benefits and alternatives. We discussed the high likelihood of a successful procedure. We discussed the risks of the procedure including infection, bleeding, tissue injury and further surgery. We discussed the low dose of radioactivity involved in the procedure. Informed, written consent was given. The usual time-out protocol was performed immediately prior to the procedure. Using mammographic guidance, sterile technique, 1% lidocaine and an I-125 radioactive seed, the known mass/biopsy clip was localized using a superior approach. The follow-up mammogram images confirm the seed in the expected location and were marked for Dr. Donne Hazel. Follow-up survey of the patient confirms presence of the radioactive seed. Order number of I-125 seed:  [id number]. Total activity:  4.709 millicuries reference Date: January 22, 2019 The patient tolerated the procedure well and was released from the [place]. She was given instructions regarding seed removal. IMPRESSION: Radioactive seed localization right breast. No apparent complications. 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 We discussed the risks, benefits, and side effects of radiotherapy. I recommend radiotherapy to the right breast to reduce her risk of locoregional recurrence by 2/3.  We discussed that radiation would take approximately 4 weeks to complete and that I would give the patient a few weeks to heal following surgery before starting treatment planning. We spoke about acute effects including skin irritation and fatigue as well as much less common late effects including internal organ injury or irritation. We spoke about the latest technology that is used to minimize the risk of late effects for patients undergoing radiotherapy to the breast or chest wall. No guarantees of treatment were given. The patient is enthusiastic about proceeding with treatment. I look forward to participating in the patient's care. Will simulate at the end of September to allow more time for healing from reexcision.  Of note the patient mentions that getting her treatment in [facility] may be more convenient to her daily work travel.  She is exploring that option though she feels there is a good chance that she may still pursue her treatment here in [place].  I encouraged her to do what she needs to do to explore her options and that she should feel no pressure to get treatment here if ultimately getting treatment in [facility] is better for her.  She appreciated our discussion and knows to call if she needs to cancel her simulation later this month.  This encounter was provided by  telemedicine platform Webex.  The patient has given verbal consent for this type of encounter and has been advised to only accept a meeting of this type in a secure network environment. The time spent during this encounter was OVER 20 minutes. The attendants for this meeting include Eppie Gibson  and Lowell Guitar.  During the encounter, Eppie Gibson was located at [facility] Radiation Oncology Department.  Gloria Frazier was located at home.     __________________________________________   Eppie Gibson, MD   This document serves as a record of services personally performed by Eppie Gibson, MD. It was created on her behalf by Wilburn Mylar, a trained medical scribe. The creation of this record is based on the scribe's personal observations and the provider's statements to them. This document has been checked  and approved by the attending provider.

## 2019-02-23 NOTE — Op Note (Signed)
Preoperative diagnosis:Right breast cancer, clinical stage I with positive margin  Postoperative diagnosis: Same as above Procedure: Rightbreast re-excision lumpectomy Surgeon: Dr. Serita Grammes Anesthesia: General  Specimens: right breast superior anterior and inferior anterior margins marked short superior, long lateral, double deep Estimated blood [id number] cc Complications: None Drains: None Sponge and count was correct at completion Disposition to recovery in stable condition  Indications: This is [phone] with newly diagnosed right breast cancer. This is IDC.  Nodes are negative. She had lumpectomy and sn biopsy with a positive anterior margin. We discussed excision of this margin  Procedure:  After  informed consent was obtained shewas placed under general anesthesia without complication. She was given antibiotics. SCDs were in place. She was then prepped and draped in the standard sterile surgical fashion. A surgical timeout was then performed.  I then identified the seed in theupperouter quadrant of therightbreast.I elected to make a periareolar incision to hide the scar.Iinfiltrated Marcaine. I then reentered the old incision. I released all the sutures and then removed the entire anterior margin.  I  mobilized the breast tissue and closed this with 2-0 Vicryl, 3-0 Vicryl, and5-0 Monocryl.She tolerated this well was extubated and transferred to the recovery room stable.

## 2019-02-24 ENCOUNTER — Ambulatory Visit
Admission: RE | Admit: 2019-02-24 | Discharge: 2019-02-24 | Disposition: A | Discharge status: Home / Self Care | Payer: 59 | Source: Ambulatory Visit | Attending: Radiation Oncology | Admitting: Radiation Oncology

## 2019-02-24 ENCOUNTER — Encounter: Payer: Self-pay | Admitting: Radiation Oncology

## 2019-02-24 ENCOUNTER — Other Ambulatory Visit: Payer: Self-pay

## 2019-02-24 DIAGNOSIS — C50411 Malignant neoplasm of upper-outer quadrant of right female breast: Secondary | ICD-10-CM

## 2019-02-24 DIAGNOSIS — Z170 Estrogen receptor positive status [ER+]: Secondary | ICD-10-CM

## 2019-03-15 ENCOUNTER — Other Ambulatory Visit: Payer: Self-pay | Admitting: Oncology

## 2019-03-17 ENCOUNTER — Ambulatory Visit: Payer: 59 | Admitting: Radiation Oncology

## 2019-03-22 ENCOUNTER — Ambulatory Visit: Payer: 59 | Admitting: Radiation Oncology

## 2019-03-24 ENCOUNTER — Ambulatory Visit: Payer: 59

## 2019-03-25 ENCOUNTER — Ambulatory Visit: Payer: 59

## 2019-03-25 ENCOUNTER — Telehealth: Payer: Self-pay | Admitting: Oncology

## 2019-03-25 NOTE — Telephone Encounter (Signed)
[facility] 10/29 moved appointments from 10/29 to 11/13. Left message for patient. Schedule mailed.

## 2019-03-26 ENCOUNTER — Ambulatory Visit: Payer: 59

## 2019-03-29 ENCOUNTER — Ambulatory Visit: Payer: 59

## 2019-03-30 ENCOUNTER — Ambulatory Visit: Payer: 59

## 2019-03-31 ENCOUNTER — Ambulatory Visit: Payer: 59

## 2019-04-01 ENCOUNTER — Ambulatory Visit: Payer: 59

## 2019-04-02 ENCOUNTER — Ambulatory Visit: Payer: 59

## 2019-04-05 ENCOUNTER — Ambulatory Visit: Payer: 59

## 2019-04-06 ENCOUNTER — Ambulatory Visit: Payer: 59

## 2019-04-07 ENCOUNTER — Telehealth: Payer: Self-pay | Admitting: Family Medicine

## 2019-04-07 ENCOUNTER — Ambulatory Visit: Payer: 59

## 2019-04-07 ENCOUNTER — Other Ambulatory Visit: Payer: Self-pay

## 2019-04-07 NOTE — Telephone Encounter (Signed)
[facility] for HD

## 2019-04-07 NOTE — Telephone Encounter (Signed)
Noted! Thank you

## 2019-04-07 NOTE — Telephone Encounter (Signed)
Pt has an appt with the nurse at [facility] at [facility] location for flu shot tomorrow 04/08/2019.  Pt request high dose flu shot, Dr. Birdie Riddle please advise ok for this since the pt is 63 years old.

## 2019-04-07 NOTE — Telephone Encounter (Signed)
Ok for high dose b/c she has breast cancer

## 2019-04-08 ENCOUNTER — Ambulatory Visit: Payer: 59

## 2019-04-08 ENCOUNTER — Ambulatory Visit (INDEPENDENT_AMBULATORY_CARE_PROVIDER_SITE_OTHER): Payer: 59

## 2019-04-08 ENCOUNTER — Encounter: Payer: Self-pay | Admitting: Family Medicine

## 2019-04-08 DIAGNOSIS — Z23 Encounter for immunization: Secondary | ICD-10-CM

## 2019-04-08 NOTE — Progress Notes (Signed)
Pt came into the office for high dose flu shot, injection given on the left deltoid, pt tolerated injection well, information gave to the pt.

## 2019-04-09 ENCOUNTER — Other Ambulatory Visit: Payer: Self-pay | Admitting: Family Medicine

## 2019-04-09 ENCOUNTER — Ambulatory Visit: Payer: 59

## 2019-04-09 NOTE — Telephone Encounter (Signed)
Please advise, these that are on current med list do not have your name on them.

## 2019-04-09 NOTE — Telephone Encounter (Signed)
Pt called in asking for a refill on the concerta XR 18 MGs and the Methylphenidate 10mg . Pt uses the [facility] on [address]

## 2019-04-12 ENCOUNTER — Ambulatory Visit: Payer: 59

## 2019-04-12 NOTE — Telephone Encounter (Signed)
Spoke with pt. She sates that she barely has them filled. IF she needs it she takes 1 extended release and then 1 of the other if needed. On her bottle the SIG is to take 1 tablet daily at lunch if needed for additional concentration.   Per patient they were last filled in March 2020

## 2019-04-12 NOTE — Telephone Encounter (Signed)
Please verify with pt how she is taking this.  She has a long acting and also has twice daily short acting.  Typically it's long acting w/ 1 short acting if needed for extra sxs control

## 2019-04-13 ENCOUNTER — Ambulatory Visit: Payer: 59

## 2019-04-13 MED ORDER — METHYLPHENIDATE HCL ER (OSM) 18 MG PO TBCR: 18.0000 mg | EXTENDED_RELEASE_TABLET | Freq: Every day | ORAL | 0 refills | Status: DC

## 2019-04-13 MED ORDER — METHYLPHENIDATE HCL 10 MG PO TABS: ORAL_TABLET | ORAL | 0 refills | Status: DC

## 2019-04-14 ENCOUNTER — Ambulatory Visit: Payer: 59

## 2019-04-15 ENCOUNTER — Ambulatory Visit: Payer: 59

## 2019-04-16 ENCOUNTER — Ambulatory Visit: Payer: 59

## 2019-04-19 ENCOUNTER — Ambulatory Visit: Payer: 59

## 2019-04-20 ENCOUNTER — Ambulatory Visit: Payer: 59

## 2019-04-22 ENCOUNTER — Encounter: Payer: Self-pay | Admitting: *Deleted

## 2019-04-22 ENCOUNTER — Ambulatory Visit: Payer: 59 | Admitting: Oncology
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
## 2019-05-07 ENCOUNTER — Other Ambulatory Visit: Payer: Self-pay

## 2019-05-07 ENCOUNTER — Inpatient Hospital Stay: Payer: 59 | Attending: Oncology | Admitting: Oncology

## 2019-05-07 VITALS — BP 126/74 | HR 59 | Temp 98.0°F | Resp 18 | Ht 65.0 in | Wt 160.0 lb

## 2019-05-07 DIAGNOSIS — F329 Major depressive disorder, single episode, unspecified: Secondary | ICD-10-CM | POA: Insufficient documentation

## 2019-05-07 DIAGNOSIS — Z90710 Acquired absence of both cervix and uterus: Secondary | ICD-10-CM | POA: Insufficient documentation

## 2019-05-07 DIAGNOSIS — C50411 Malignant neoplasm of upper-outer quadrant of right female breast: Secondary | ICD-10-CM | POA: Insufficient documentation

## 2019-05-07 DIAGNOSIS — Z9079 Acquired absence of other genital organ(s): Secondary | ICD-10-CM | POA: Insufficient documentation

## 2019-05-07 DIAGNOSIS — Z923 Personal history of irradiation: Secondary | ICD-10-CM | POA: Diagnosis not present

## 2019-05-07 DIAGNOSIS — F419 Anxiety disorder, unspecified: Secondary | ICD-10-CM | POA: Diagnosis not present

## 2019-05-07 DIAGNOSIS — Z90722 Acquired absence of ovaries, bilateral: Secondary | ICD-10-CM | POA: Diagnosis not present

## 2019-05-07 DIAGNOSIS — Z170 Estrogen receptor positive status [ER+]: Secondary | ICD-10-CM | POA: Insufficient documentation

## 2019-05-07 DIAGNOSIS — F909 Attention-deficit hyperactivity disorder, unspecified type: Secondary | ICD-10-CM | POA: Diagnosis not present

## 2019-05-07 DIAGNOSIS — Z79899 Other long term (current) drug therapy: Secondary | ICD-10-CM | POA: Diagnosis not present

## 2019-05-07 DIAGNOSIS — Z79810 Long term (current) use of selective estrogen receptor modulators (SERMs): Secondary | ICD-10-CM | POA: Insufficient documentation

## 2019-05-07 MED ORDER — TAMOXIFEN CITRATE 20 MG PO TABS: 20.0000 mg | ORAL_TABLET | Freq: Every day | ORAL | 4 refills | Status: AC

## 2019-05-10 ENCOUNTER — Telehealth: Payer: Self-pay | Admitting: Oncology

## 2019-05-10 NOTE — Telephone Encounter (Signed)
I left a message regarding schedule  

## 2019-06-22 ENCOUNTER — Encounter: Payer: Self-pay | Admitting: *Deleted

## 2019-07-07 ENCOUNTER — Ambulatory Visit (INDEPENDENT_AMBULATORY_CARE_PROVIDER_SITE_OTHER): Payer: Medicare Other | Admitting: Family Medicine

## 2019-07-07 ENCOUNTER — Other Ambulatory Visit: Payer: Self-pay

## 2019-07-07 ENCOUNTER — Encounter: Payer: Self-pay | Admitting: Family Medicine

## 2019-07-07 VITALS — BP 121/81 | HR 76 | Temp 97.9°F | Resp 16 | Ht 65.0 in | Wt 157.5 lb

## 2019-07-07 DIAGNOSIS — C50411 Malignant neoplasm of upper-outer quadrant of right female breast: Secondary | ICD-10-CM | POA: Diagnosis not present

## 2019-07-07 DIAGNOSIS — E663 Overweight: Secondary | ICD-10-CM

## 2019-07-07 DIAGNOSIS — Z0000 Encounter for general adult medical examination without abnormal findings: Secondary | ICD-10-CM | POA: Diagnosis not present

## 2019-07-07 DIAGNOSIS — Z170 Estrogen receptor positive status [ER+]: Secondary | ICD-10-CM

## 2019-07-07 LAB — BASIC METABOLIC PANEL
BUN: 21 mg/dL (ref 6–23)
CO2: 30 mEq/L (ref 19–32)
Calcium: 9.6 mg/dL (ref 8.4–10.5)
Chloride: 104 mEq/L (ref 96–112)
Creatinine, Ser: 0.81 mg/dL (ref 0.40–1.20)
GFR: 70.97 mL/min (ref >60.00)
Glucose, Bld: 97 mg/dL (ref 70–99)
Potassium: 4.5 mEq/L (ref 3.5–5.1)
Sodium: 142 mEq/L (ref 135–145)

## 2019-07-07 LAB — CBC WITH DIFFERENTIAL/PLATELET
Basophils Absolute: 0 10*3/uL (ref 0.0–0.1)
Basophils Relative: 0.7 % (ref 0.0–3.0)
Eosinophils Absolute: 0.1 10*3/uL (ref 0.0–0.7)
Eosinophils Relative: 1.9 % (ref 0.0–5.0)
HCT: 42.4 % (ref 36.0–46.0)
Hemoglobin: 14.1 g/dL (ref 12.0–15.0)
Lymphocytes Relative: 20.8 % (ref 12.0–46.0)
Lymphs Abs: 1.1 10*3/uL (ref 0.7–4.0)
MCHC: 33.3 g/dL (ref 30.0–36.0)
MCV: 92.1 fl (ref 78.0–100.0)
Monocytes Absolute: 0.4 10*3/uL (ref 0.1–1.0)
Monocytes Relative: 8.6 % (ref 3.0–12.0)
Neutro Abs: 3.4 10*3/uL (ref 1.4–7.7)
Neutrophils Relative %: 68 % (ref 43.0–77.0)
Platelets: 288 10*3/uL (ref 150.0–400.0)
RBC: 4.61 Mil/uL (ref 3.87–5.11)
RDW: 12.2 % (ref 11.5–15.5)
WBC: 5.1 10*3/uL (ref 4.0–10.5)

## 2019-07-07 LAB — LIPID PANEL
Cholesterol: 200 mg/dL (ref 0–200)
HDL: 72.8 mg/dL (ref >39.00)
LDL Cholesterol: 112 mg/dL — ABNORMAL HIGH (ref 0–99)
NonHDL: 127.04
Total CHOL/HDL Ratio: 3
Triglycerides: 74 mg/dL (ref 0.0–149.0)
VLDL: 14.8 mg/dL (ref 0.0–40.0)

## 2019-07-07 LAB — HEPATIC FUNCTION PANEL
ALT: 51 U/L — ABNORMAL HIGH (ref 0–35)
AST: 32 U/L (ref 0–37)
Albumin: 4.4 g/dL (ref 3.5–5.2)
Alkaline Phosphatase: 83 U/L (ref 39–117)
Bilirubin, Direct: 0.1 mg/dL (ref 0.0–0.3)
Total Bilirubin: 0.6 mg/dL (ref 0.2–1.2)
Total Protein: 6.9 g/dL (ref 6.0–8.3)

## 2019-07-07 MED ORDER — METHYLPHENIDATE HCL 10 MG PO TABS: ORAL_TABLET | ORAL | 0 refills | Status: DC

## 2019-07-07 MED ORDER — METHYLPHENIDATE HCL ER (OSM) 18 MG PO TBCR: 18.0000 mg | EXTENDED_RELEASE_TABLET | Freq: Every day | ORAL | 0 refills | Status: DC

## 2019-07-07 NOTE — Patient Instructions (Signed)
Follow up in 1 year or as needed We'll notify you of your lab results and make any changes if needed Continue to work on healthy diet and regular exercise- you're doing great! Call with any questions or concerns Stay Safe!  Stay Healthy!   Preventive Care 4-65 Years Old, Female Preventive care refers to visits with your health care provider and lifestyle choices that can promote health and wellness. This includes:  A yearly physical exam. This may also be called an annual well check.  Regular dental visits and eye exams.  Immunizations.  Screening for certain conditions.  Healthy lifestyle choices, such as eating a healthy diet, getting regular exercise, not using drugs or products that contain nicotine and tobacco, and limiting alcohol use. What can I expect for my preventive care visit? Physical exam Your health care provider will check your:  Height and weight. This may be used to calculate body mass index (BMI), which tells if you are at a healthy weight.  Heart rate and blood pressure.  Skin for abnormal spots. Counseling Your health care provider may ask you questions about your:  Alcohol, tobacco, and drug use.  Emotional well-being.  Home and relationship well-being.  Sexual activity.  Eating habits.  Work and work Statistician.  Method of birth control.  Menstrual cycle.  Pregnancy history. What immunizations do I need?  Influenza (flu) vaccine  This is recommended every year. Tetanus, diphtheria, and pertussis (Tdap) vaccine  You may need a Td booster every 10 years. Varicella (chickenpox) vaccine  You may need this if you have not been vaccinated. Zoster (shingles) vaccine  You may need this after age 33. Measles, mumps, and rubella (MMR) vaccine  You may need at least one dose of MMR if you were born in 1957 or later. You may also need a second dose. Pneumococcal conjugate (PCV13) vaccine  You may need this if you have certain conditions  and were not previously vaccinated. Pneumococcal polysaccharide (PPSV23) vaccine  You may need one or two doses if you smoke cigarettes or if you have certain conditions. Meningococcal conjugate (MenACWY) vaccine  You may need this if you have certain conditions. Hepatitis A vaccine  You may need this if you have certain conditions or if you travel or work in places where you may be exposed to hepatitis A. Hepatitis B vaccine  You may need this if you have certain conditions or if you travel or work in places where you may be exposed to hepatitis B. Haemophilus influenzae type b (Hib) vaccine  You may need this if you have certain conditions. Human papillomavirus (HPV) vaccine  If recommended by your health care provider, you may need three doses over 6 months. You may receive vaccines as individual doses or as more than one vaccine together in one shot (combination vaccines). Talk with your health care provider about the risks and benefits of combination vaccines. What tests do I need? Blood tests  Lipid and cholesterol levels. These may be checked every 5 years, or more frequently if you are over 7 years old.  Hepatitis C test.  Hepatitis B test. Screening  Lung cancer screening. You may have this screening every year starting at age 7 if you have a 30-pack-year history of smoking and currently smoke or have quit within the past 15 years.  Colorectal cancer screening. All adults should have this screening starting at age 37 and continuing until age 82. Your health care provider may recommend screening at age 23 if you  at increased risk. You will have tests every 1-10 years, depending on your results and the type of screening test.  Diabetes screening. This is done by checking your blood sugar (glucose) after you have not eaten for a while (fasting). You may have this done every 1-3 years.  Mammogram. This may be done every 1-2 years. Talk with your health care provider  about when you should start having regular mammograms. This may depend on whether you have a family history of breast cancer.  BRCA-related cancer screening. This may be done if you have a family history of breast, ovarian, tubal, or peritoneal cancers.  Pelvic exam and Pap test. This may be done every 3 years starting at age 21. Starting at age 30, this may be done every 5 years if you have a Pap test in combination with an HPV test. Other tests  Sexually transmitted disease (STD) testing.  Bone density scan. This is done to screen for osteoporosis. You may have this scan if you are at high risk for osteoporosis. Follow these instructions at home: Eating and drinking  Eat a diet that includes fresh fruits and vegetables, whole grains, lean protein, and low-fat dairy.  Take vitamin and mineral supplements as recommended by your health care provider.  Do not drink alcohol if: ? Your health care provider tells you not to drink. ? You are pregnant, may be pregnant, or are planning to become pregnant.  If you drink alcohol: ? Limit how much you have to 0-1 drink a day. ? Be aware of how much alcohol is in your drink. In the [place] one drink equals one 12 oz bottle of beer (355 mL), one 5 oz glass of wine (148 mL), or one 1 oz glass of hard liquor (44 mL). Lifestyle  Take daily care of your teeth and gums.  Stay active. Exercise for at least 30 minutes on 5 or more days each week.  Do not use any products that contain nicotine or tobacco, such as cigarettes, e-cigarettes, and chewing tobacco. If you need help quitting, ask your health care provider.  If you are sexually active, practice safe sex. Use a condom or other form of birth control (contraception) in order to prevent pregnancy and STIs (sexually transmitted infections).  If told by your health care provider, take low-dose aspirin daily starting at age 50. What's next?  Visit your health care provider once a year for a well  check visit.  Ask your health care provider how often you should have your eyes and teeth checked.  Stay up to date on all vaccines. This information is not intended to replace advice given to you by your health care provider. Make sure you discuss any questions you have with your health care provider. Document Revised: 02/19/2018 Document Reviewed: 02/19/2018 Elsevier Patient Education  2020 Elsevier Inc.  

## 2019-07-07 NOTE — Assessment & Plan Note (Signed)
Pt's PE WNL.  UTD on colonoscopy, mammo, flu.  Will get prevnar once she turns 64.  Reviewed care team, health maintenance, prescriptions refilled.  Counseled on healthy diet, regular exercise.  Check labs to risk stratify.  Will follow.

## 2019-07-07 NOTE — Assessment & Plan Note (Signed)
Chronic problem.  On Tamoxifen daily

## 2019-07-07 NOTE — Progress Notes (Signed)
Subjective:    Patient ID: Gloria Frazier, female    DOB: 01/15/1956, 64 y.o.   MRN: [id number]  HPI Here today for MWV/Welcome to Medicare.  Risk Factors: Overweight- ongoing issue, BMI is 26.21 Breast Cancer- following w/ Oncology, on Tamoxifen. Physical Activity: walking 3x/week, some resistance training Fall Risk: low risk Depression: denies current sxs Hearing: normal to conversational tones, whispered voice ADL's: independent Cognitive: normal linear thought process, memory and attention intact Home Safety: safe at home Height, Weight, BMI, Visual Acuity: see vitals, vision corrected to 20/20 w/ glasses (had eye exam December 2020 and pt declined repeat exam today) Counseling: UTD on colonoscopy, mammo, pap.  UTD on flu Labs Ordered: See A&P Care Plan: See A&P   Patient Care Team    Relationship Specialty Notifications Start End  Midge Minium, MD PCP - General Family Medicine  10/01/11   Maisie Fus, MD Consulting Physician Obstetrics and Gynecology  06/29/15   Richmond Campbell, MD Consulting Physician Gastroenterology  06/29/15   Gaynelle Arabian, MD Consulting Physician Orthopedic Surgery  12/08/17   Mauro Kaufmann, RN Oncology Nurse Navigator   01/07/19   Rockwell Germany, RN Oncology Nurse Navigator   01/07/19   Eppie Gibson, MD Attending Physician Radiation Oncology  01/11/19   Rolm Bookbinder, MD Consulting Physician General Surgery  01/28/19      Health Maintenance  Topic Date Due  . TETANUS/TDAP  07/06/2020 (Originally 06/16/2019)  . HIV Screening  07/06/2020 (Originally 07/19/1969)  . MAMMOGRAM  12/28/2020  . PAP SMEAR-Modifier  01/03/2022  . COLONOSCOPY  10/29/2022  . INFLUENZA VACCINE  Completed  . Hepatitis C Screening  Completed      Review of Systems Patient reports no vision/ hearing changes, adenopathy,fever, weight change,  persistant/recurrent hoarseness , swallowing issues, chest pain, palpitations, edema, persistant/recurrent cough,  hemoptysis, dyspnea (rest/exertional/paroxysmal nocturnal), gastrointestinal bleeding (melena, rectal bleeding), abdominal pain, significant heartburn, bowel changes, GU symptoms (dysuria, hematuria, incontinence), Gyn symptoms (abnormal  bleeding, pain),  syncope, focal weakness, memory loss, numbness & tingling, skin/nail changes, abnormal bruising or bleeding, anxiety, or depression.  + hair thinning due to tamoxifen   This visit occurred during the SARS-CoV-2 public health emergency.  Safety protocols were in place, including screening questions prior to the visit, additional usage of staff PPE, and extensive cleaning of exam room while observing appropriate contact time as indicated for disinfecting solutions.      Objective:   Physical Exam General Appearance:    Alert, cooperative, no distress, appears stated age  Head:    Normocephalic, without obvious abnormality, atraumatic  Eyes:    PERRL, conjunctiva/corneas clear, EOM's intact, fundi    benign, both eyes  Ears:    Normal TM's and external ear canals, both ears  Nose:   Deferred due to COVID  Throat:   Neck:   Supple, symmetrical, trachea midline, no adenopathy;    Thyroid: no enlargement/tenderness/nodules  Back:     Symmetric, no curvature, ROM normal, no CVA tenderness  Lungs:     Clear to auscultation bilaterally, respirations unlabored  Chest Wall:    No tenderness or deformity   Heart:    Regular rate and rhythm, S1 and S2 normal, no murmur, rub   or gallop  Breast Exam:    Deferred to GYN  Abdomen:     Soft, non-tender, bowel sounds active all four quadrants,    no masses, no organomegaly  Genitalia:    Deferred to GYN  Rectal:  Extremities:   Extremities normal, atraumatic, no cyanosis or edema  Pulses:   2+ and symmetric all extremities  Skin:   Skin color, texture, turgor normal, no rashes or lesions  Lymph nodes:   Cervical, supraclavicular, and axillary nodes normal  Neurologic:   CNII-XII intact, normal  strength, sensation and reflexes    throughout          Assessment & Plan:

## 2019-07-08 ENCOUNTER — Other Ambulatory Visit: Payer: Self-pay | Admitting: Family Medicine

## 2019-07-08 DIAGNOSIS — R7989 Other specified abnormal findings of blood chemistry: Secondary | ICD-10-CM

## 2019-07-08 DIAGNOSIS — R945 Abnormal results of liver function studies: Secondary | ICD-10-CM

## 2019-07-26 ENCOUNTER — Other Ambulatory Visit: Payer: Self-pay

## 2019-07-27 ENCOUNTER — Other Ambulatory Visit (INDEPENDENT_AMBULATORY_CARE_PROVIDER_SITE_OTHER): Payer: Medicare Other

## 2019-07-27 DIAGNOSIS — R945 Abnormal results of liver function studies: Secondary | ICD-10-CM

## 2019-07-27 DIAGNOSIS — R7989 Other specified abnormal findings of blood chemistry: Secondary | ICD-10-CM

## 2019-07-27 LAB — HEPATIC FUNCTION PANEL
ALT: 67 U/L — ABNORMAL HIGH (ref 0–35)
AST: 48 U/L — ABNORMAL HIGH (ref 0–37)
Albumin: 4.2 g/dL (ref 3.5–5.2)
Alkaline Phosphatase: 89 U/L (ref 39–117)
Bilirubin, Direct: 0.1 mg/dL (ref 0.0–0.3)
Total Bilirubin: 0.7 mg/dL (ref 0.2–1.2)
Total Protein: 6.7 g/dL (ref 6.0–8.3)

## 2019-07-28 ENCOUNTER — Encounter: Payer: Self-pay | Admitting: Family Medicine

## 2019-07-28 ENCOUNTER — Other Ambulatory Visit: Payer: Self-pay | Admitting: Family Medicine

## 2019-07-28 DIAGNOSIS — R7989 Other specified abnormal findings of blood chemistry: Secondary | ICD-10-CM

## 2019-07-28 DIAGNOSIS — R945 Abnormal results of liver function studies: Secondary | ICD-10-CM

## 2019-07-30 ENCOUNTER — Telehealth: Payer: Self-pay

## 2019-07-30 NOTE — Telephone Encounter (Signed)
Pt concerned with labs from PCP - elevated AST and ALT - Pt has reported stopping her Tamoxifen since she received results of labs.  Seeking advise and recommendations.   RN reviewed with MD. MD recommendations to continue to hold Tamoxifen until follow up on 2/16 and recheck labs that day prior to follow up.  Pt verbalized understanding.  RN educated patient to avoid ETOH and Acetaminophen.

## 2019-07-31 ENCOUNTER — Other Ambulatory Visit: Payer: Self-pay | Admitting: Oncology

## 2019-08-04 ENCOUNTER — Other Ambulatory Visit: Payer: Self-pay | Admitting: Physician Assistant

## 2019-08-04 NOTE — Telephone Encounter (Signed)
Patient states she is not taking Mobic currently. Patient states she does not need a refill .

## 2019-08-04 NOTE — Telephone Encounter (Signed)
Patient had hepatic function panel checked on 07/26/18: AST 48 and ALT 67.   Please clarify how often the patient is taking meloxicam.  Has she had any other medication changes? Please advised patient to avoid taking tylenol and alcohol.  Dr. Birdie Riddle will be rechecking her labs in 2-3 weeks.   She should avoid taking meloxicam until then.

## 2019-08-04 NOTE — Telephone Encounter (Signed)
Last Visit: 02/12/19 Next Visit: 08/19/19 Labs: 07/07/19 ALT 51 all other labs WNL  Okay to refill Mobic?

## 2019-08-04 NOTE — Telephone Encounter (Signed)
Attempted to contact the patient and left message for patient to call the office.  

## 2019-08-05 ENCOUNTER — Telehealth: Payer: Self-pay

## 2019-08-05 NOTE — Telephone Encounter (Signed)
Patient called in for advice. She states that since her liver enzymes have been elevated, will it be okay for her to get her covid vaccine. She spoke with her oncologist that stated there is not much data out and stated that maybe you may have more insight. Patient is scheduled for the covid 19 vaccine Saturday and would like to have your input. Please advise.

## 2019-08-05 NOTE — Telephone Encounter (Signed)
She should get the COVID vaccine.  Her liver enzymes are only mildly elevated and I have not seen that this is a contraindication.

## 2019-08-05 NOTE — Telephone Encounter (Signed)
Patient notified of PCP recommendations and is agreement and expresses an understanding.  
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
## 2019-08-10 ENCOUNTER — Other Ambulatory Visit: Payer: 59

## 2019-08-10 ENCOUNTER — Inpatient Hospital Stay: Payer: Medicare Other | Attending: Oncology | Admitting: Oncology

## 2019-08-10 ENCOUNTER — Other Ambulatory Visit: Payer: Self-pay

## 2019-08-10 ENCOUNTER — Telehealth: Payer: Self-pay | Admitting: Oncology

## 2019-08-10 ENCOUNTER — Inpatient Hospital Stay: Payer: Medicare Other

## 2019-08-10 VITALS — BP 125/70 | HR 72 | Temp 98.2°F | Resp 18 | Ht 65.0 in | Wt 157.2 lb

## 2019-08-10 DIAGNOSIS — Z923 Personal history of irradiation: Secondary | ICD-10-CM | POA: Insufficient documentation

## 2019-08-10 DIAGNOSIS — Z170 Estrogen receptor positive status [ER+]: Secondary | ICD-10-CM | POA: Insufficient documentation

## 2019-08-10 DIAGNOSIS — Z9079 Acquired absence of other genital organ(s): Secondary | ICD-10-CM | POA: Insufficient documentation

## 2019-08-10 DIAGNOSIS — C50411 Malignant neoplasm of upper-outer quadrant of right female breast: Secondary | ICD-10-CM

## 2019-08-10 DIAGNOSIS — F909 Attention-deficit hyperactivity disorder, unspecified type: Secondary | ICD-10-CM | POA: Insufficient documentation

## 2019-08-10 DIAGNOSIS — Z79899 Other long term (current) drug therapy: Secondary | ICD-10-CM | POA: Insufficient documentation

## 2019-08-10 DIAGNOSIS — R7989 Other specified abnormal findings of blood chemistry: Secondary | ICD-10-CM | POA: Diagnosis not present

## 2019-08-10 DIAGNOSIS — Z79810 Long term (current) use of selective estrogen receptor modulators (SERMs): Secondary | ICD-10-CM | POA: Insufficient documentation

## 2019-08-10 DIAGNOSIS — M160 Bilateral primary osteoarthritis of hip: Secondary | ICD-10-CM | POA: Diagnosis not present

## 2019-08-10 DIAGNOSIS — K219 Gastro-esophageal reflux disease without esophagitis: Secondary | ICD-10-CM

## 2019-08-10 DIAGNOSIS — Z90722 Acquired absence of ovaries, bilateral: Secondary | ICD-10-CM | POA: Insufficient documentation

## 2019-08-10 DIAGNOSIS — F419 Anxiety disorder, unspecified: Secondary | ICD-10-CM | POA: Diagnosis not present

## 2019-08-10 DIAGNOSIS — Z90710 Acquired absence of both cervix and uterus: Secondary | ICD-10-CM | POA: Diagnosis not present

## 2019-08-10 LAB — CBC WITH DIFFERENTIAL (CANCER CENTER ONLY)
Abs Immature Granulocytes: 0.03 10*3/uL (ref 0.00–0.07)
Basophils Absolute: 0 10*3/uL (ref 0.0–0.1)
Basophils Relative: 1 %
Eosinophils Absolute: 0.1 10*3/uL (ref 0.0–0.5)
Eosinophils Relative: 2 %
HCT: 41.9 % (ref 36.0–46.0)
Hemoglobin: 13.8 g/dL (ref 12.0–15.0)
Immature Granulocytes: 1 %
Lymphocytes Relative: 25 %
Lymphs Abs: 1.1 10*3/uL (ref 0.7–4.0)
MCH: 30.6 pg (ref 26.0–34.0)
MCHC: 32.9 g/dL (ref 30.0–36.0)
MCV: 92.9 fL (ref 80.0–100.0)
Monocytes Absolute: 0.5 10*3/uL (ref 0.1–1.0)
Monocytes Relative: 10 %
Neutro Abs: 2.8 10*3/uL (ref 1.7–7.7)
Neutrophils Relative %: 61 %
Platelet Count: 248 10*3/uL (ref 150–400)
RBC: 4.51 MIL/uL (ref 3.87–5.11)
RDW: 11.6 % (ref 11.5–15.5)
WBC Count: 4.6 10*3/uL (ref 4.0–10.5)
nRBC: 0 % (ref 0.0–0.2)

## 2019-08-10 LAB — CMP (CANCER CENTER ONLY)
ALT: 60 U/L — ABNORMAL HIGH (ref 0–44)
AST: 38 U/L (ref 15–41)
Albumin: 3.9 g/dL (ref 3.5–5.0)
Alkaline Phosphatase: 74 U/L (ref 38–126)
Anion gap: 7 (ref 5–15)
BUN: 13 mg/dL (ref 8–23)
CO2: 28 mmol/L (ref 22–32)
Calcium: 9 mg/dL (ref 8.9–10.3)
Chloride: 107 mmol/L (ref 98–111)
Creatinine: 0.85 mg/dL (ref 0.44–1.00)
GFR, Est AFR Am: >60 mL/min (ref >60)
GFR, Estimated: >60 mL/min (ref >60)
Glucose, Bld: 93 mg/dL (ref 70–99)
Potassium: 4.2 mmol/L (ref 3.5–5.1)
Sodium: 142 mmol/L (ref 135–145)
Total Bilirubin: 0.5 mg/dL (ref 0.3–1.2)
Total Protein: 6.8 g/dL (ref 6.5–8.1)

## 2019-08-10 NOTE — Telephone Encounter (Signed)
I left a message regarding schedule  

## 2019-08-11 ENCOUNTER — Ambulatory Visit: Payer: Medicare Other | Attending: Internal Medicine

## 2019-08-11 DIAGNOSIS — Z20822 Contact with and (suspected) exposure to covid-19: Secondary | ICD-10-CM

## 2019-08-13 LAB — NOVEL CORONAVIRUS, NAA: SARS-CoV-2, NAA: NOT DETECTED

## 2019-08-19 ENCOUNTER — Ambulatory Visit: Payer: 59 | Admitting: Physician Assistant

## 2019-09-07 ENCOUNTER — Inpatient Hospital Stay: Payer: Medicare Other | Attending: Oncology

## 2019-09-07 ENCOUNTER — Other Ambulatory Visit: Payer: Self-pay

## 2019-09-07 DIAGNOSIS — Z79810 Long term (current) use of selective estrogen receptor modulators (SERMs): Secondary | ICD-10-CM | POA: Insufficient documentation

## 2019-09-07 DIAGNOSIS — Z170 Estrogen receptor positive status [ER+]: Secondary | ICD-10-CM | POA: Insufficient documentation

## 2019-09-07 DIAGNOSIS — M160 Bilateral primary osteoarthritis of hip: Secondary | ICD-10-CM

## 2019-09-07 DIAGNOSIS — C50411 Malignant neoplasm of upper-outer quadrant of right female breast: Secondary | ICD-10-CM | POA: Insufficient documentation

## 2019-09-07 DIAGNOSIS — K219 Gastro-esophageal reflux disease without esophagitis: Secondary | ICD-10-CM

## 2019-09-07 LAB — CBC WITH DIFFERENTIAL/PLATELET
Abs Immature Granulocytes: 0.02 10*3/uL (ref 0.00–0.07)
Basophils Absolute: 0.1 10*3/uL (ref 0.0–0.1)
Basophils Relative: 1 %
Eosinophils Absolute: 0.2 10*3/uL (ref 0.0–0.5)
Eosinophils Relative: 4 %
HCT: 41 % (ref 36.0–46.0)
Hemoglobin: 13.4 g/dL (ref 12.0–15.0)
Immature Granulocytes: 0 %
Lymphocytes Relative: 25 %
Lymphs Abs: 1.4 10*3/uL (ref 0.7–4.0)
MCH: 30.2 pg (ref 26.0–34.0)
MCHC: 32.7 g/dL (ref 30.0–36.0)
MCV: 92.6 fL (ref 80.0–100.0)
Monocytes Absolute: 0.5 10*3/uL (ref 0.1–1.0)
Monocytes Relative: 10 %
Neutro Abs: 3.3 10*3/uL (ref 1.7–7.7)
Neutrophils Relative %: 60 %
Platelets: 256 10*3/uL (ref 150–400)
RBC: 4.43 MIL/uL (ref 3.87–5.11)
RDW: 11.8 % (ref 11.5–15.5)
WBC: 5.5 10*3/uL (ref 4.0–10.5)
nRBC: 0 % (ref 0.0–0.2)

## 2019-09-07 LAB — COMPREHENSIVE METABOLIC PANEL
ALT: 31 U/L (ref 0–44)
AST: 28 U/L (ref 15–41)
Albumin: 3.8 g/dL (ref 3.5–5.0)
Alkaline Phosphatase: 77 U/L (ref 38–126)
Anion gap: 9 (ref 5–15)
BUN: 13 mg/dL (ref 8–23)
CO2: 27 mmol/L (ref 22–32)
Calcium: 9.1 mg/dL (ref 8.9–10.3)
Chloride: 104 mmol/L (ref 98–111)
Creatinine, Ser: 0.79 mg/dL (ref 0.44–1.00)
GFR calc Af Amer: >60 mL/min (ref >60)
GFR calc non Af Amer: >60 mL/min (ref >60)
Glucose, Bld: 87 mg/dL (ref 70–99)
Potassium: 4 mmol/L (ref 3.5–5.1)
Sodium: 140 mmol/L (ref 135–145)
Total Bilirubin: 0.6 mg/dL (ref 0.3–1.2)
Total Protein: 6.7 g/dL (ref 6.5–8.1)

## 2019-09-09 ENCOUNTER — Telehealth: Payer: Self-pay

## 2019-09-09 NOTE — Telephone Encounter (Signed)
Pt was notified of lab results from 3/16 - Per MD recommendations patient was advised to re-start Tamoxifen and keep follow up appointment with MD in 10/2019.  Pt verbalized understanding and agreement.

## 2019-09-14 ENCOUNTER — Telehealth: Payer: Self-pay

## 2019-09-14 NOTE — Telephone Encounter (Signed)
Patient called in stating she think she is overdue for her tetanus shot. Patient's last TDaP immunization was 06/15/09. Patient would like to know if she can have that done at the [place] location since she lives close to that location. Please advise.Marland Kitchen

## 2019-09-14 NOTE — Telephone Encounter (Signed)
Now that pt is 64, Medicare does not cover the Tdap in office.  It will cover a Td if she has a wound.  If she desires a Tdap, she can schedule this at a local pharmacy or check with her insurance as to how much out of pocket cost would be.  Also, she should not have vaccine within 2 weeks of either COVID shot

## 2019-09-14 NOTE — Telephone Encounter (Signed)
Called patient and informed her of PCP recommendations. Patient voiced understanding and stated she will check with the pharmacy.

## 2019-10-04 ENCOUNTER — Telehealth: Payer: Self-pay | Admitting: Family Medicine

## 2019-10-04 NOTE — Progress Notes (Signed)
°  Chronic Care Management   Outreach Note  10/04/2019 Name: NYLEEN SCHWAHN MRN: [id number] DOB: August 27, 1955  Referred by: Midge Minium, MD Reason for referral : No chief complaint on file.   An unsuccessful telephone outreach was attempted today. The patient was referred to the pharmacist for assistance with care management and care coordination.   Follow Up Plan:   Earney Hamburg Upstream Scheduler

## 2019-10-08 ENCOUNTER — Telehealth: Payer: Self-pay | Admitting: Family Medicine

## 2019-10-08 NOTE — Progress Notes (Signed)
°  Chronic Care Management   Note  10/08/2019 Name: Gloria Frazier MRN: [id number] DOB: Dec 30, 1955  Gloria Frazier is a 64 y.o. year old female who is a primary care patient of Birdie Riddle, [name], MD. I reached out to [employer] by phone today in response to a referral sent by [name]'s PCP, Midge Minium, MD.   [name] was given information about Chronic Care Management services today including:  1. CCM service includes personalized support from designated clinical staff supervised by her physician, including individualized plan of care and coordination with other care providers 2. 24/7 contact phone numbers for assistance for urgent and routine care needs. 3. Service will only be billed when office clinical staff spend 20 minutes or more in a month to coordinate care. 4. Only one practitioner may furnish and bill the service in a calendar month. 5. The patient may stop CCM services at any time (effective at the end of the month) by phone call to the office staff.   Patient agreed to services and verbal consent obtained.   Follow up plan:   Earney Hamburg Upstream Scheduler

## 2019-10-14 ENCOUNTER — Telehealth: Payer: Self-pay | Admitting: Family Medicine

## 2019-10-14 NOTE — Progress Notes (Signed)
  Chronic Care Management   Outreach Note  10/14/2019 Name: Gloria Frazier MRN: [id number] DOB: 16-Sep-1955  Referred by: Midge Minium, MD Reason for referral : No chief complaint on file.   An unsuccessful telephone outreach was attempted today. The patient was referred to the pharmacist for assistance with care management and care coordination.   Follow Up Plan:   Earney Hamburg Upstream Scheduler

## 2019-10-15 ENCOUNTER — Other Ambulatory Visit: Payer: Self-pay | Admitting: General Practice

## 2019-10-15 DIAGNOSIS — E663 Overweight: Secondary | ICD-10-CM

## 2019-10-15 DIAGNOSIS — R7989 Other specified abnormal findings of blood chemistry: Secondary | ICD-10-CM

## 2019-10-15 DIAGNOSIS — R945 Abnormal results of liver function studies: Secondary | ICD-10-CM

## 2019-11-08 ENCOUNTER — Other Ambulatory Visit: Payer: Self-pay

## 2019-11-08 ENCOUNTER — Inpatient Hospital Stay: Payer: Medicare Other | Attending: Oncology

## 2019-11-08 DIAGNOSIS — Z90722 Acquired absence of ovaries, bilateral: Secondary | ICD-10-CM | POA: Insufficient documentation

## 2019-11-08 DIAGNOSIS — Z170 Estrogen receptor positive status [ER+]: Secondary | ICD-10-CM

## 2019-11-08 DIAGNOSIS — K219 Gastro-esophageal reflux disease without esophagitis: Secondary | ICD-10-CM | POA: Diagnosis not present

## 2019-11-08 DIAGNOSIS — F419 Anxiety disorder, unspecified: Secondary | ICD-10-CM | POA: Diagnosis not present

## 2019-11-08 DIAGNOSIS — C50411 Malignant neoplasm of upper-outer quadrant of right female breast: Secondary | ICD-10-CM | POA: Diagnosis present

## 2019-11-08 DIAGNOSIS — Z90710 Acquired absence of both cervix and uterus: Secondary | ICD-10-CM | POA: Insufficient documentation

## 2019-11-08 DIAGNOSIS — Z79899 Other long term (current) drug therapy: Secondary | ICD-10-CM | POA: Insufficient documentation

## 2019-11-08 DIAGNOSIS — F909 Attention-deficit hyperactivity disorder, unspecified type: Secondary | ICD-10-CM | POA: Insufficient documentation

## 2019-11-08 DIAGNOSIS — R232 Flushing: Secondary | ICD-10-CM | POA: Insufficient documentation

## 2019-11-08 DIAGNOSIS — Z79810 Long term (current) use of selective estrogen receptor modulators (SERMs): Secondary | ICD-10-CM | POA: Insufficient documentation

## 2019-11-08 DIAGNOSIS — M160 Bilateral primary osteoarthritis of hip: Secondary | ICD-10-CM

## 2019-11-08 DIAGNOSIS — M1990 Unspecified osteoarthritis, unspecified site: Secondary | ICD-10-CM | POA: Insufficient documentation

## 2019-11-08 LAB — CBC WITH DIFFERENTIAL/PLATELET
Abs Immature Granulocytes: 0.01 10*3/uL (ref 0.00–0.07)
Basophils Absolute: 0.1 10*3/uL (ref 0.0–0.1)
Basophils Relative: 1 %
Eosinophils Absolute: 0.1 10*3/uL (ref 0.0–0.5)
Eosinophils Relative: 2 %
HCT: 43.5 % (ref 36.0–46.0)
Hemoglobin: 14 g/dL (ref 12.0–15.0)
Immature Granulocytes: 0 %
Lymphocytes Relative: 24 %
Lymphs Abs: 1.4 10*3/uL (ref 0.7–4.0)
MCH: 30.4 pg (ref 26.0–34.0)
MCHC: 32.2 g/dL (ref 30.0–36.0)
MCV: 94.4 fL (ref 80.0–100.0)
Monocytes Absolute: 0.5 10*3/uL (ref 0.1–1.0)
Monocytes Relative: 8 %
Neutro Abs: 3.8 10*3/uL (ref 1.7–7.7)
Neutrophils Relative %: 65 %
Platelets: 257 10*3/uL (ref 150–400)
RBC: 4.61 MIL/uL (ref 3.87–5.11)
RDW: 11.7 % (ref 11.5–15.5)
WBC: 6 10*3/uL (ref 4.0–10.5)
nRBC: 0 % (ref 0.0–0.2)

## 2019-11-08 LAB — COMPREHENSIVE METABOLIC PANEL
ALT: 25 U/L (ref 0–44)
AST: 24 U/L (ref 15–41)
Albumin: 3.8 g/dL (ref 3.5–5.0)
Alkaline Phosphatase: 54 U/L (ref 38–126)
Anion gap: 10 (ref 5–15)
BUN: 14 mg/dL (ref 8–23)
CO2: 27 mmol/L (ref 22–32)
Calcium: 9.3 mg/dL (ref 8.9–10.3)
Chloride: 105 mmol/L (ref 98–111)
Creatinine, Ser: 0.88 mg/dL (ref 0.44–1.00)
GFR calc Af Amer: >60 mL/min (ref >60)
GFR calc non Af Amer: >60 mL/min (ref >60)
Glucose, Bld: 91 mg/dL (ref 70–99)
Potassium: 4.3 mmol/L (ref 3.5–5.1)
Sodium: 142 mmol/L (ref 135–145)
Total Bilirubin: 0.4 mg/dL (ref 0.3–1.2)
Total Protein: 7 g/dL (ref 6.5–8.1)
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
## 2019-11-10 ENCOUNTER — Inpatient Hospital Stay (HOSPITAL_BASED_OUTPATIENT_CLINIC_OR_DEPARTMENT_OTHER): Payer: Medicare Other | Admitting: Oncology

## 2019-11-10 ENCOUNTER — Other Ambulatory Visit: Payer: Self-pay

## 2019-11-10 VITALS — BP 116/73 | HR 67 | Temp 98.5°F | Resp 18 | Ht 65.0 in | Wt 160.3 lb

## 2019-11-10 DIAGNOSIS — C50411 Malignant neoplasm of upper-outer quadrant of right female breast: Secondary | ICD-10-CM | POA: Diagnosis not present

## 2019-11-10 DIAGNOSIS — Z170 Estrogen receptor positive status [ER+]: Secondary | ICD-10-CM

## 2019-11-10 MED ORDER — GABAPENTIN 300 MG PO CAPS: 300.0000 mg | ORAL_CAPSULE | Freq: Every day | ORAL | 4 refills | Status: DC

## 2019-11-11 ENCOUNTER — Telehealth: Payer: Medicare Other

## 2019-11-11 ENCOUNTER — Ambulatory Visit: Payer: Medicare Other

## 2019-11-11 ENCOUNTER — Telehealth: Payer: Self-pay | Admitting: Oncology

## 2019-11-11 DIAGNOSIS — M160 Bilateral primary osteoarthritis of hip: Secondary | ICD-10-CM

## 2019-11-11 DIAGNOSIS — F902 Attention-deficit hyperactivity disorder, combined type: Secondary | ICD-10-CM
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
## 2019-11-11 NOTE — Patient Instructions (Addendum)
Please call me at [phone] (direct line) with any questions - thank you!  - Edyth Gunnels., Clinical Pharmacist  Goals Addressed            This Visit's Progress   . PharmD Care Plan       CARE PLAN ENTRY  Current Barriers:  . Chronic Disease Management support, education, and care coordination needs related to  ADHD, hot flashes, osteoarthritis.   ADHD . Pharmacist Clinical Goal(s) o Over the next 180 days, patient will work with PharmD and providers to minimize symptoms of ADHD if needed. . Current regimen:   Methylphenidate cr 18 mg daily  Methylphenidate ir 10 mg daily as needed for additional symptom control  . Interventions: o Continue current management . Patient self care activities - Over the next 180 days, patient will: o Continue current management  Osteoarthritis . Pharmacist Clinical Goal(s) o Over the next 180 days, patient will work with PharmD and providers to minimize pain-related symptoms as needed. . Current regimen:  o Meloxicam 7.5 mg once or twice daily as needed o Voltaren topical gel as needed  . Interventions: o Continue current management . Patient self care activities - Over the next 180 days, patient will: o Continue current management  Hot flashes . Pharmacist Clinical Goal(s) o Over the next 180 days, patient will work with PharmD and providers to minimize hot flash symptoms as needed. . Current regimen:  o Starting on gabapentin 300 mg every night as needed . Interventions: o Continue current management . Patient self care activities - Over the next 180 days, patient will: o Continue current management  Medication management . Pharmacist Clinical Goal(s): o Over the next 180 days, patient will work with PharmD and providers to maintain optimal medication adherence . [facility] . Interventions o Comprehensive medication review performed. o Continue current medication management strategy . Patient self care activities -  Over the next 180 days, patient will: o Focus on medication adherence by continuing current management o Take medications as prescribed o Report any questions or concerns to PharmD and/or provider(s)  Initial goal documentation.        Gloria Frazier was given information about Chronic Care Management services today including:  1. CCM service includes personalized support from designated clinical staff supervised by Lowell Guitar "[name]"'s physician, including individualized plan of care and coordination with other care providers 2. 24/7 contact phone numbers for assistance for urgent and routine care needs. 3. Standard insurance, coinsurance, copays and deductibles apply for chronic care management only during months in which we provide at least 20 minutes of these services. Most insurances cover these services at 100%, however patients may be responsible for any copay, coinsurance and/or deductible if applicable. This service may help you avoid the need for more expensive face-to-face services. 4. Only one practitioner may furnish and bill the service in a calendar month. 5. The patient may stop CCM services at any time (effective at the end of the month) by phone call to the office staff.  Patient agreed to services and verbal consent obtained.   The patient verbalized understanding of instructions provided today and agreed to receive a mailed copy of patient instruction and/or educational materials. Telephone follow up appointment with pharmacy team member scheduled for: See next appointment with "Care Management Staff" under "What's Next" below.   Thank you!  [name], Pharm.D., BCGP Clinical Pharmacist [facility] [phone]  Osteoarthritis  Osteoarthritis is a type of arthritis  that affects tissue that covers the ends of bones in joints (cartilage). Cartilage acts as a cushion between the bones and helps them move smoothly. Osteoarthritis results  when cartilage in the joints gets worn down. Osteoarthritis is sometimes called "wear and tear" arthritis. Osteoarthritis is the most common form of arthritis. It often occurs in older people. It is a condition that gets worse over time (a progressive condition). Joints that are most often affected by this condition are in:  Fingers.  Toes.  Hips.  Knees.  Spine, including neck and lower back. What are the causes? This condition is caused by age-related wearing down of cartilage that covers the ends of bones. What increases the risk? The following factors may make you more likely to develop this condition:  Older age.  Being overweight or obese.  Overuse of joints, such as in athletes.  Past injury of a joint.  Past surgery on a joint.  Family history of osteoarthritis. What are the signs or symptoms? The main symptoms of this condition are pain, swelling, and stiffness in the joint. The joint may lose its shape over time. Small pieces of bone or cartilage may break off and float inside of the joint, which may cause more pain and damage to the joint. Small deposits of bone (osteophytes) may grow on the edges of the joint. Other symptoms may include:  A grating or scraping feeling inside the joint when you move it.  Popping or creaking sounds when you move. Symptoms may affect one or more joints. Osteoarthritis in a major joint, such as your knee or hip, can make it painful to walk or exercise. If you have osteoarthritis in your hands, you might not be able to grip items, twist your hand, or control small movements of your hands and fingers (fine motor skills). How is this diagnosed? This condition may be diagnosed based on:  Your medical history.  A physical exam.  Your symptoms.  X-rays of the affected joint(s).  Blood tests to rule out other types of arthritis. How is this treated? There is no cure for this condition, but treatment can help to control pain and  improve joint function. Treatment plans may include:  A prescribed exercise program that allows for rest and joint relief. You may work with a physical therapist.  A weight control plan.  Pain relief techniques, such as: ? Applying heat and cold to the joint. ? Electric pulses delivered to nerve endings under the skin (transcutaneous electrical nerve stimulation, or TENS). ? Massage. ? Certain nutritional supplements.  NSAIDs or prescription medicines to help relieve pain.  Medicine to help relieve pain and inflammation (corticosteroids). This can be given by mouth (orally) or as an injection.  Assistive devices, such as a brace, wrap, splint, specialized glove, or cane.  Surgery, such as: ? An osteotomy. This is done to reposition the bones and relieve pain or to remove loose pieces of bone and cartilage. ? Joint replacement surgery. You may need this surgery if you have very bad (advanced) osteoarthritis. Follow these instructions at home: Activity  Rest your affected joints as directed by your health care provider.  Do not drive or use heavy machinery while taking prescription pain medicine.  Exercise as directed. Your health care provider or physical therapist may recommend specific types of exercise, such as: ? Strengthening exercises. These are done to strengthen the muscles that support joints that are affected by arthritis. They can be performed with weights or with exercise bands to  add resistance. ? Aerobic activities. These are exercises, such as brisk walking or water aerobics, that get your heart pumping. ? Range-of-motion activities. These keep your joints easy to move. ? Balance and agility exercises. Managing pain, stiffness, and swelling      If directed, apply heat to the affected area as often as told by your health care provider. Use the heat source that your health care provider recommends, such as a moist heat pack or a heating pad. ? If you have a  removable assistive device, remove it as told by your health care provider. ? Place a towel between your skin and the heat source. If your health care provider tells you to keep the assistive device on while you apply heat, place a towel between the assistive device and the heat source. ? Leave the heat on for 20-30 minutes. ? Remove the heat if your skin turns bright red. This is especially important if you are unable to feel pain, heat, or cold. You may have a greater risk of getting burned.  If directed, put ice on the affected joint: ? If you have a removable assistive device, remove it as told by your health care provider. ? Put ice in a plastic bag. ? Place a towel between your skin and the bag. If your health care provider tells you to keep the assistive device on during icing, place a towel between the assistive device and the bag. ? Leave the ice on for 20 minutes, 2-3 times a day. General instructions  Take over-the-counter and prescription medicines only as told by your health care provider.  Maintain a healthy weight. Follow instructions from your health care provider for weight control. These may include dietary restrictions.  Do not use any products that contain nicotine or tobacco, such as cigarettes and e-cigarettes. These can delay bone healing. If you need help quitting, ask your health care provider.  Use assistive devices as directed by your health care provider.  Keep all follow-up visits as told by your health care provider. This is important. Where to find more information  [employer] of Arthritis and Musculoskeletal and Skin Diseases: www.niams.SouthExposed.es  [employer] on Aging: http://kim-miller.com/  American College of Rheumatology: www.rheumatology.org Contact a health care provider if:  Your skin turns red.  You develop a rash.  You have pain that gets worse.  You have a fever along with joint or muscle aches. Get help right away if:  You lose a  lot of weight.  You suddenly lose your appetite.  You have night sweats. Summary  Osteoarthritis is a type of arthritis that affects tissue covering the ends of bones in joints (cartilage).  This condition is caused by age-related wearing down of cartilage that covers the ends of bones.  The main symptom of this condition is pain, swelling, and stiffness in the joint.  There is no cure for this condition, but treatment can help to control pain and improve joint function. This information is not intended to replace advice given to you by your health care provider. Make sure you discuss any questions you have with your health care provider. Document Revised: 05/23/2017 Document Reviewed: 02/12/2016 Elsevier Patient Education  2020 [employer].

## 2019-11-11 NOTE — Telephone Encounter (Signed)
Scheduled appts per 5/19 los. Pt confirmed appt date and time.  

## 2019-11-11 NOTE — Progress Notes (Signed)
Chronic Care Management Pharmacy  Name: Gloria Frazier  MRN: [id number] DOB: 12-30-1955  Chief Complaint/ HPI  Gloria Frazier,  64 y.o. , adult presents for their Initial CCM visit with the clinical pharmacist via telephone due to COVID-19 Pandemic.  PCP : Midge Minium, MD  Their chronic conditions include:  Encounter Diagnoses  Name Primary?  . Primary osteoarthritis of both hips   . ADHD (attention deficit hyperactivity disorder), combined type     Office Visits:  07/07/2019 (PCP): Will be due for prevnar at next visit.   Consult Visit: 11/10/2019 (Dr. Jana Hakim, oncology): Back on tamoxifen x 2 months, previously held due to elevated liver fxns. Gabapentin initiated for hot flashes.   Patient Active Problem List   Diagnosis Date Noted  . Malignant neoplasm of upper-outer quadrant of right breast in female, estrogen receptor positive ([place]) 01/08/2019  . Primary osteoarthritis of both hands 01/28/2017  . Primary osteoarthritis of both hips 01/28/2017  . Flat foot 01/28/2017  . Generalized hypermobility of joints 01/28/2017  . Abdominal pain, epigastric 12/13/2013  . Welcome to Medicare preventive visit 06/10/2013  . ADHD (attention deficit hyperactivity disorder), combined type 03/09/2012  . Hip arthritis 03/09/2012  . GERD (gastroesophageal reflux disease) 10/13/2011  . Bunion of great toe 10/13/2011   Social History   Socioeconomic History  . Marital status: Married    Spouse name: Not on file  . Number of children: Not on file  . Years of education: Not on file  . Highest education level: Not on file  Occupational History  . Not on file  Tobacco Use  . Smoking status: Never Smoker  . Smokeless tobacco: Never Used  Substance and Sexual Activity  . Alcohol use: Not Currently  . Drug use: No  . Sexual activity: Not on file  Other Topics Concern  . Not on file  Social History Narrative  . Not on file   Social Determinants of Health   Financial  Resource Strain:   . Difficulty of Paying Living Expenses:   Food Insecurity: No Food Insecurity  . Worried About Charity fundraiser in the Last Year: Never true  . Ran Out of Food in the Last Year: Never true  Transportation Needs: No Transportation Needs  . Lack of Transportation (Medical): No  . Lack of Transportation (Non-Medical): No  Physical Activity:   . Days of Exercise per Week:   . Minutes of Exercise per Session:   Stress:   . Feeling of Stress :   Social Connections:   . Frequency of Communication with Friends and Family:   . Frequency of Social Gatherings with Friends and Family:   . Attends Religious Services:   . Active Member of Clubs or Organizations:   . Attends Archivist Meetings:   Marland Kitchen Marital Status:    Family History  Problem Relation Age of Onset  . Colon cancer Neg Hx   . Esophageal cancer Neg Hx   . Rectal cancer Neg Hx   . Stomach cancer Neg Hx   . Colon polyps Neg Hx    No Known Allergies  Outpatient Encounter Medications as of 11/11/2019  Medication Sig Note  . ALPRAZolam (XANAX) 0.5 MG tablet Take 0.5 mg by mouth at bedtime as needed for anxiety.    . diclofenac sodium (VOLTAREN) 1 % GEL Apply 2 g to 4 g to affected joint up to 4 times daily PRN (Patient taking differently: Apply 1 application topically daily. )   .  gabapentin (NEURONTIN) 300 MG capsule Take 1 capsule (300 mg total) by mouth at bedtime.   . meloxicam (MOBIC) 7.5 MG tablet Take 1 tablet (7.5 mg) by mouth BID PRN.   . methylphenidate (RITALIN) 10 MG tablet Take 1 tab as needed for additional symptom control   . methylphenidate 18 MG PO CR tablet Take 1 tablet (18 mg total) by mouth daily.   . tamoxifen (NOLVADEX) 20 MG tablet Take 20 mg by mouth daily.   Marland Kitchen tretinoin (RETIN-A) 0.025 % cream    . Multiple Vitamin (MULTIVITAMIN) tablet Take 5 tablets by mouth daily.  01/26/2019: Packet of 5 tablets  . zolpidem (AMBIEN) 5 MG tablet Take 5 mg by mouth at bedtime as needed for  sleep.    No facility-administered encounter medications on file as of 11/11/2019.   Current Diagnosis/Assessment: Goals Addressed            This Visit's Progress   . PharmD Care Plan       CARE PLAN ENTRY  Current Barriers:  . Chronic Disease Management support, education, and care coordination needs related to  ADHD, hot flashes, osteoarthritis.   ADHD . Pharmacist Clinical Goal(s) o Over the next 180 days, patient will work with PharmD and providers to minimize symptoms of ADHD if needed. . Current regimen:   Methylphenidate cr 18 mg daily  Methylphenidate ir 10 mg daily as needed for additional symptom control  . Interventions: o Continue current management . Patient self care activities - Over the next 180 days, patient will: o Continue current management  Osteoarthritis . Pharmacist Clinical Goal(s) o Over the next 180 days, patient will work with PharmD and providers to minimize pain-related symptoms as needed. . Current regimen:  o Meloxicam 7.5 mg once or twice daily as needed o Voltaren topical gel as needed  . Interventions: o Continue current management . Patient self care activities - Over the next 180 days, patient will: o Continue current management  Hot flashes . Pharmacist Clinical Goal(s) o Over the next 180 days, patient will work with PharmD and providers to minimize hot flash symptoms as needed. . Current regimen:  o Starting on gabapentin 300 mg every night as needed . Interventions: o Continue current management . Patient self care activities - Over the next 180 days, patient will: o Continue current management  Medication management . Pharmacist Clinical Goal(s): o Over the next 180 days, patient will work with PharmD and providers to maintain optimal medication adherence . [facility] . Interventions o Comprehensive medication review performed. o Continue current medication management strategy . Patient self care activities  - Over the next 180 days, patient will: o Focus on medication adherence by continuing current management o Take medications as prescribed o Report any questions or concerns to PharmD and/or provider(s)  Initial goal documentation.      Exercise / Diet       Component Value Date/Time   CHOL 200 07/07/2019 0901   TRIG 74.0 07/07/2019 0901   HDL 72.80 07/07/2019 0901   CHOLHDL 3 07/07/2019 0901   VLDL 14.8 07/07/2019 0901   LDLCALC 112 (H) 07/07/2019 0901   LDLDIRECT 130.2 06/10/2013 1117    The 10-year ASCVD risk score Mikey Bussing DC Jr., et al., 2013) is: 4.1%   Values used to calculate the score:     Age: 64 years     Sex: Female     Is Non-Hispanic African American: No     Diabetic: No  Tobacco smoker: No     Systolic Blood Pressure: 99991111 mmHg     Is BP treated: No     HDL Cholesterol: 72.8 mg/dL     Total Cholesterol: 200 mg/dL   Walks ~3 miles most days. ASCVD risk low. Not a candidate for pharmacologic management at this time. LDL slightly elevated 06/2019, has consistently been <100 since 2017.   Plan  Continue control with diet and exercise. Will continue to monitor lipids with routine labs.   ADHD   Has not needed medications daily but uses when needing help concentration with big projects on occasion. Reports continued benefit with current regimen.  Denies any side effects at this time.  Patient is currently controlled on:  Methylphenidate cr 18 mg daily  Methylphenidate ir 10 mg daily as needed for additional symptom control   Plan  Continue current medication.   GERD   Patient denies dysphagia, heartburn or nausea. Previously tried pantoprazole 40 mg daily as very short course of therapy. Currently controlled without medication management or avoidance of triggers.   Plan   Continue current management.   Osteoarthritis   Patient has tired these meds in past: turmeric, glucosamine, methocarbamol  Pain is infrequent, reports meloxicam helps if needed.  Has not given voltaren much of a trial.  Patient is currently controlled on the following medications:   Meloxicam 7.5 mg twice daily as needed  Voltaren topical gel as needed  Plan  Continue current medications   Sleep   Does not consistently use medications to help with sleep. Occasionally will use alprazolam when traveling. Has never used zolpidem, was prescribed at time of breast cancer diagnosis.   Patient is currently controlled on the following medications:   Alprazolam 0.25 mg as needed at bedtime for anxiety   Plan  Continue current medications   Breast cancer / Hot flashes    Breast cancer currently managed with tamoxifen 20 mg daily. Previously held due to concern with liver function elevation but has since resolved. Continues with therapy.  States hot flashes have improved but are still bothersome after stopping hormone therapy. Will be starting gabapentin for first time today. Patient has failed these meds in past: n/a.  Patient will be starting on:  Gabapentin 300 mg every night as needed.   We discussed side effects and was counseled on gabapentin use.   Plan  Continue current medications.   Vaccines   Reviewed and discussed patient's vaccination history.  Due for Tdap, Prevnar.  Immunization History  Administered Date(s) Administered  . DTaP 06/24/1997  . Fluad Quad(high Dose 65+) 04/08/2019  . Influenza,inj,Quad PF,6+ Mos 04/07/2013, 04/26/2014, 05/09/2015, 05/01/2016, 04/17/2017, 04/21/2018  . Influenza-Unspecified 03/31/2017  . Td 06/15/2009  . Tdap 06/15/2009  . Zoster 05/09/2015  . Zoster Recombinat (Shingrix) 07/03/2018, 10/06/2018   Plan  Recommended patient receive Tdap vaccine at pharmacy, [place] [facility].   Medication Management   [facility]  [id number] [place] [place] [address] Phone: [phone] Fax: [phone]  Denies any issues  with medication management at this time.  Plan  Continue current medication management strategy.  Follow up: 6 month phone visit. ______________ Visit Information SDOH (Social Determinants of Health) assessments performed: Yes.    Gloria Frazier was given information about Chronic Care Management services today including:  1. CCM service includes personalized support from designated clinical staff supervised by Gloria Frazier "Gloria Frazier"'s physician, including individualized plan of care and coordination  with other care providers 2. 24/7 contact phone numbers for assistance for urgent and routine care needs. 3. Standard insurance, coinsurance, copays and deductibles apply for chronic care management only during months in which we provide at least 20 minutes of these services. Most insurances cover these services at 100%, however patients may be responsible for any copay, coinsurance and/or deductible if applicable. This service may help you avoid the need for more expensive face-to-face services. 4. Only one practitioner may furnish and bill the service in a calendar month. 5. The patient may stop CCM services at any time (effective at the end of the month) by phone call to the office staff.  Patient agreed to services and verbal consent obtained.   [name], Pharm.D., BCGP Clinical Pharmacist [facility] [phone]

## 2019-11-12 ENCOUNTER — Telehealth: Payer: Self-pay | Admitting: *Deleted

## 2019-11-12 DIAGNOSIS — N63 Unspecified lump in unspecified breast: Secondary | ICD-10-CM

## 2019-11-12 DIAGNOSIS — Z170 Estrogen receptor positive status [ER+]: Secondary | ICD-10-CM

## 2019-11-12 NOTE — Telephone Encounter (Signed)
This RN spoke with pt per her call stating she discussed area of concern in her right breast at MD visit and she understood plan was to get a dedicated scan of area.  [facility] and per above was informed she only has her yearly mammogram scheduled which is " several months away "- she would need an additional order to obtain a dedicated scan now.  This RN reviewed MD note- and above is addressed but MD expected annual mammogram to be moved up for review.  This RN discussed above with pt stating she would rather have an additional dedicated scan now and then do her yearly as expected in late July.  This RN placed order for right breast mammo/US with bx if needed.  Pt is in agreement with plan.  Order sent to MD for signiture.  No further needs at this time.

## 2019-11-15 ENCOUNTER — Other Ambulatory Visit: Payer: Self-pay | Admitting: Oncology

## 2019-11-15 DIAGNOSIS — C50411 Malignant neoplasm of upper-outer quadrant of right female breast: Secondary | ICD-10-CM

## 2019-11-15 DIAGNOSIS — Z170 Estrogen receptor positive status [ER+]: Secondary | ICD-10-CM

## 2019-11-15 DIAGNOSIS — N63 Unspecified lump in unspecified breast: Secondary | ICD-10-CM

## 2019-11-23 ENCOUNTER — Ambulatory Visit: Payer: 59 | Admitting: Rheumatology

## 2019-11-24 ENCOUNTER — Other Ambulatory Visit: Payer: Self-pay

## 2019-11-24 ENCOUNTER — Ambulatory Visit
Admission: RE | Admit: 2019-11-24 | Discharge: 2019-11-24 | Disposition: A | Discharge status: Home / Self Care | Payer: Medicare Other | Source: Ambulatory Visit | Attending: Oncology | Admitting: Oncology

## 2019-11-24 ENCOUNTER — Other Ambulatory Visit: Payer: Self-pay | Admitting: Oncology

## 2019-11-24 DIAGNOSIS — C50411 Malignant neoplasm of upper-outer quadrant of right female breast: Secondary | ICD-10-CM

## 2019-11-24 DIAGNOSIS — N63 Unspecified lump in unspecified breast: Secondary | ICD-10-CM

## 2019-11-24 DIAGNOSIS — Z170 Estrogen receptor positive status [ER+]: Secondary | ICD-10-CM

## 2019-12-01 ENCOUNTER — Ambulatory Visit (INDEPENDENT_AMBULATORY_CARE_PROVIDER_SITE_OTHER): Payer: Medicare Other | Admitting: Rheumatology

## 2019-12-01 ENCOUNTER — Encounter: Payer: Self-pay | Admitting: Rheumatology

## 2019-12-01 ENCOUNTER — Other Ambulatory Visit: Payer: Self-pay

## 2019-12-01 VITALS — BP 111/74 | HR 72 | Resp 16 | Ht 65.0 in | Wt 160.4 lb

## 2019-12-01 DIAGNOSIS — Z96641 Presence of right artificial hip joint: Secondary | ICD-10-CM

## 2019-12-01 DIAGNOSIS — M1612 Unilateral primary osteoarthritis, left hip: Secondary | ICD-10-CM

## 2019-12-01 DIAGNOSIS — M2480 Other specific joint derangements of unspecified joint, not elsewhere classified: Secondary | ICD-10-CM | POA: Diagnosis not present

## 2019-12-01 DIAGNOSIS — Z853 Personal history of malignant neoplasm of breast: Secondary | ICD-10-CM

## 2019-12-01 DIAGNOSIS — M2141 Flat foot [pes planus] (acquired), right foot: Secondary | ICD-10-CM

## 2019-12-01 DIAGNOSIS — M19041 Primary osteoarthritis, right hand: Secondary | ICD-10-CM | POA: Diagnosis not present

## 2019-12-01 DIAGNOSIS — M21619 Bunion of unspecified foot: Secondary | ICD-10-CM

## 2019-12-01 DIAGNOSIS — M19042 Primary osteoarthritis, left hand: Secondary | ICD-10-CM

## 2019-12-01 DIAGNOSIS — M2142 Flat foot [pes planus] (acquired), left foot: Secondary | ICD-10-CM
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
## 2019-12-30 ENCOUNTER — Ambulatory Visit (INDEPENDENT_AMBULATORY_CARE_PROVIDER_SITE_OTHER): Payer: Medicare Other | Admitting: Physician Assistant

## 2019-12-30 ENCOUNTER — Encounter: Payer: Self-pay | Admitting: Physician Assistant

## 2019-12-30 ENCOUNTER — Other Ambulatory Visit: Payer: Self-pay

## 2019-12-30 ENCOUNTER — Other Ambulatory Visit: Payer: Self-pay | Admitting: Physician Assistant

## 2019-12-30 VITALS — BP 108/78 | HR 62 | Temp 98.2°F | Resp 14 | Ht 65.0 in | Wt 159.0 lb

## 2019-12-30 DIAGNOSIS — R21 Rash and other nonspecific skin eruption: Secondary | ICD-10-CM | POA: Diagnosis not present

## 2019-12-30 DIAGNOSIS — S40862A Insect bite (nonvenomous) of left upper arm, initial encounter: Secondary | ICD-10-CM

## 2019-12-30 MED ORDER — DOXYCYCLINE HYCLATE 100 MG PO TABS: 100.0000 mg | ORAL_TABLET | Freq: Two times a day (BID) | ORAL | 0 refills | Status: DC
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
## 2019-12-30 NOTE — Telephone Encounter (Signed)
Last Visit: 12/01/2019 Next Visit: 06/01/2020 Labs: 11/08/2019 WNL  Okay to refill per Dr. Deveshwar  

## 2019-12-30 NOTE — Patient Instructions (Addendum)
Please keep well-hydrated Apply cold compresses to the arm. Can take a Claritin or Benadryl to help with histamine response in the area.   Please go to the lab today for blood work.  I will call you with your results. We will alter treatment regimen(s) if indicated by your results.   Again watch the area -- if much improved by tomorrow likely just histamine response. But if still having tenderness ok to go ahead and take the Doxycycline as directed.   Let me know if you note any new or worsening symptoms.  Hang in there!

## 2020-01-03 ENCOUNTER — Encounter: Payer: Self-pay | Admitting: Physician Assistant

## 2020-01-03 DIAGNOSIS — S40862A Insect bite (nonvenomous) of left upper arm, initial encounter: Secondary | ICD-10-CM

## 2020-01-03 LAB — EXTRA SPECIMEN

## 2020-01-03 LAB — B. BURGDORFI ANTIBODIES

## 2020-01-03 NOTE — Telephone Encounter (Signed)
Yes will need repeat lyme ab testing due to incorrect tube being drawn. She will not be charged for the repeat blood draw itself.

## 2020-01-06 ENCOUNTER — Ambulatory Visit (INDEPENDENT_AMBULATORY_CARE_PROVIDER_SITE_OTHER): Payer: Medicare Other

## 2020-01-06 ENCOUNTER — Other Ambulatory Visit: Payer: Self-pay

## 2020-01-06 ENCOUNTER — Ambulatory Visit: Payer: Medicare Other

## 2020-01-06 ENCOUNTER — Other Ambulatory Visit (INDEPENDENT_AMBULATORY_CARE_PROVIDER_SITE_OTHER): Payer: Medicare Other

## 2020-01-06 DIAGNOSIS — Z23 Encounter for immunization: Secondary | ICD-10-CM | POA: Diagnosis not present

## 2020-01-06 DIAGNOSIS — S40862A Insect bite (nonvenomous) of left upper arm, initial encounter: Secondary | ICD-10-CM | POA: Diagnosis not present

## 2020-01-06 DIAGNOSIS — W57XXXA Bitten or stung by nonvenomous insect and other nonvenomous arthropods, initial encounter: Secondary | ICD-10-CM

## 2020-01-06 NOTE — Progress Notes (Signed)
Pt came in for Prevnar vaccines per her Dr at [facility], pt got the injection in her left deltoid, pt tolerated injection well.

## 2020-01-07 LAB — B. BURGDORFI ANTIBODIES: B burgdorferi Ab IgG+IgM: <0.9 index

## 2020-01-12 ENCOUNTER — Encounter: Payer: Self-pay | Admitting: Physician Assistant

## 2020-01-12 ENCOUNTER — Ambulatory Visit (INDEPENDENT_AMBULATORY_CARE_PROVIDER_SITE_OTHER): Payer: Medicare Other | Admitting: Physician Assistant

## 2020-01-12 ENCOUNTER — Other Ambulatory Visit: Payer: Self-pay

## 2020-01-12 VITALS — BP 118/78 | HR 72 | Temp 98.2°F | Resp 14 | Ht 65.0 in | Wt 159.0 lb

## 2020-01-12 DIAGNOSIS — S40862D Insect bite (nonvenomous) of left upper arm, subsequent encounter: Secondary | ICD-10-CM | POA: Diagnosis not present

## 2020-01-12 DIAGNOSIS — W57XXXD Bitten or stung by nonvenomous insect and other nonvenomous arthropods, subsequent encounter: Secondary | ICD-10-CM | POA: Diagnosis not present

## 2020-01-12 DIAGNOSIS — M7989 Other specified soft tissue disorders: Secondary | ICD-10-CM

## 2020-01-12 LAB — CBC WITH DIFFERENTIAL/PLATELET
Basophils Absolute: 0.1 10*3/uL (ref 0.0–0.1)
Basophils Relative: 0.8 % (ref 0.0–3.0)
Eosinophils Absolute: 0.1 10*3/uL (ref 0.0–0.7)
Eosinophils Relative: 1.7 % (ref 0.0–5.0)
HCT: 39.3 % (ref 36.0–46.0)
Hemoglobin: 13 g/dL (ref 12.0–15.0)
Lymphocytes Relative: 20.9 % (ref 12.0–46.0)
Lymphs Abs: 1.4 10*3/uL (ref 0.7–4.0)
MCHC: 33.1 g/dL (ref 30.0–36.0)
MCV: 92.9 fl (ref 78.0–100.0)
Monocytes Absolute: 0.5 10*3/uL (ref 0.1–1.0)
Monocytes Relative: 7.4 % (ref 3.0–12.0)
Neutro Abs: 4.6 10*3/uL (ref 1.4–7.7)
Neutrophils Relative %: 69.2 % (ref 43.0–77.0)
Platelets: 257 10*3/uL (ref 150.0–400.0)
RBC: 4.23 Mil/uL (ref 3.87–5.11)
RDW: 12.8 % (ref 11.5–15.5)
WBC: 6.6 10*3/uL (ref 4.0–10.5)
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
## 2020-01-12 NOTE — Patient Instructions (Addendum)
Please keep skin clean and dry. No evidence of residual cellulitis. We are rechecking Lyme test today via Western Blot -- a very sensitive and specific test -- meaning we can be confident in the results we receive.   No indication at present for further antibiotic.  I have ordered an US of the area of the arm as discussed. We are getting certified through insurance and then you will be contacted to schedule at the [facility]. My goal will be to get this done in the next couple of days.  Hang in there!

## 2020-01-13 ENCOUNTER — Encounter: Payer: Self-pay | Admitting: Physician Assistant

## 2020-01-13 ENCOUNTER — Ambulatory Visit
Admission: RE | Admit: 2020-01-13 | Discharge: 2020-01-13 | Disposition: A | Discharge status: Home / Self Care | Payer: Medicare Other | Source: Ambulatory Visit | Attending: Oncology | Admitting: Oncology

## 2020-01-13 DIAGNOSIS — Z170 Estrogen receptor positive status [ER+]: Secondary | ICD-10-CM

## 2020-01-13 DIAGNOSIS — C50411 Malignant neoplasm of upper-outer quadrant of right female breast: Secondary | ICD-10-CM

## 2020-01-14 LAB — LYME AB/WESTERN BLOT REFLEX
LYME DISEASE AB, QUANT, IGM: <0.8 index (ref 0.00–0.79)
Lyme IgG/IgM Ab: <0.91 {ISR} (ref 0.00–0.90)

## 2020-01-19 ENCOUNTER — Other Ambulatory Visit: Payer: Self-pay

## 2020-01-19 ENCOUNTER — Ambulatory Visit (HOSPITAL_BASED_OUTPATIENT_CLINIC_OR_DEPARTMENT_OTHER)
Admission: RE | Admit: 2020-01-19 | Discharge: 2020-01-19 | Disposition: A | Discharge status: Home / Self Care | Payer: Medicare Other | Source: Ambulatory Visit | Attending: Physician Assistant | Admitting: Physician Assistant

## 2020-01-19 DIAGNOSIS — M7989 Other specified soft tissue disorders: Secondary | ICD-10-CM | POA: Diagnosis present

## 2020-02-02 ENCOUNTER — Encounter: Payer: Self-pay | Admitting: *Deleted

## 2020-03-01 ENCOUNTER — Other Ambulatory Visit: Payer: Self-pay

## 2020-03-01 ENCOUNTER — Ambulatory Visit
Admission: RE | Admit: 2020-03-01 | Discharge: 2020-03-01 | Disposition: A | Discharge status: Home / Self Care | Payer: Medicare Other | Source: Ambulatory Visit | Attending: Oncology | Admitting: Oncology

## 2020-03-01 DIAGNOSIS — N63 Unspecified lump in unspecified breast: Secondary | ICD-10-CM

## 2020-03-01 DIAGNOSIS — Z170 Estrogen receptor positive status [ER+]: Secondary | ICD-10-CM

## 2020-04-02 ENCOUNTER — Other Ambulatory Visit: Payer: Self-pay | Admitting: Rheumatology

## 2020-04-03 NOTE — Telephone Encounter (Signed)
Last Visit: 12/01/2019 Next Visit: 06/01/2020 Labs: 11/08/2019 WNL  Okay to refill per Dr. Estanislado Pandy

## 2020-04-06 ENCOUNTER — Other Ambulatory Visit: Payer: Self-pay | Admitting: Oncology

## 2020-04-27 ENCOUNTER — Telehealth: Payer: Self-pay

## 2020-04-27 NOTE — Telephone Encounter (Signed)
RN returned call, voicemail left for return call.  

## 2020-04-28 ENCOUNTER — Telehealth: Payer: Self-pay

## 2020-04-28 NOTE — Telephone Encounter (Signed)
This pt called and LVM stating she is returning Liz's call from 11/4. Pt did not answer. LVM for pt to return call.

## 2020-05-10 ENCOUNTER — Telehealth: Payer: No Typology Code available for payment source

## 2020-05-17 ENCOUNTER — Other Ambulatory Visit: Payer: Self-pay | Admitting: Oncology
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
## 2020-06-01 ENCOUNTER — Ambulatory Visit: Payer: Medicare Other | Admitting: Physician Assistant
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
## 2020-06-22 ENCOUNTER — Telehealth: Payer: Self-pay | Admitting: Family Medicine

## 2020-06-22 NOTE — Telephone Encounter (Signed)
Left message for patient to schedule Annual Wellness Visit.  Please schedule with Nurse Health Advisor Martha Stanley, RN at [facility]  

## 2020-06-29 ENCOUNTER — Ambulatory Visit (INDEPENDENT_AMBULATORY_CARE_PROVIDER_SITE_OTHER): Payer: Medicare Other | Admitting: Physician Assistant

## 2020-06-29 ENCOUNTER — Other Ambulatory Visit: Payer: Self-pay

## 2020-06-29 ENCOUNTER — Encounter: Payer: Self-pay | Admitting: Physician Assistant

## 2020-06-29 VITALS — BP 112/79 | HR 80 | Ht 65.0 in | Wt 162.0 lb

## 2020-06-29 DIAGNOSIS — M19041 Primary osteoarthritis, right hand: Secondary | ICD-10-CM

## 2020-06-29 DIAGNOSIS — M7711 Lateral epicondylitis, right elbow: Secondary | ICD-10-CM | POA: Diagnosis not present

## 2020-06-29 DIAGNOSIS — Z96641 Presence of right artificial hip joint: Secondary | ICD-10-CM | POA: Diagnosis not present

## 2020-06-29 DIAGNOSIS — M1612 Unilateral primary osteoarthritis, left hip: Secondary | ICD-10-CM | POA: Diagnosis not present

## 2020-06-29 DIAGNOSIS — M2480 Other specific joint derangements of unspecified joint, not elsewhere classified: Secondary | ICD-10-CM

## 2020-06-29 DIAGNOSIS — M21619 Bunion of unspecified foot: Secondary | ICD-10-CM

## 2020-06-29 DIAGNOSIS — M2142 Flat foot [pes planus] (acquired), left foot: Secondary | ICD-10-CM

## 2020-06-29 DIAGNOSIS — Z853 Personal history of malignant neoplasm of breast: Secondary | ICD-10-CM

## 2020-06-29 DIAGNOSIS — M2141 Flat foot [pes planus] (acquired), right foot: Secondary | ICD-10-CM

## 2020-06-29 DIAGNOSIS — M19042 Primary osteoarthritis, left hand: Secondary | ICD-10-CM
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
## 2020-06-30 ENCOUNTER — Ambulatory Visit: Payer: Medicare Other | Admitting: Physician Assistant

## 2020-07-04 ENCOUNTER — Other Ambulatory Visit: Payer: Self-pay | Admitting: Rheumatology

## 2020-07-04 NOTE — Telephone Encounter (Signed)
Labs are due now.  Please advise patient to come in for CBC and CMP.  I will give her 1 month supply.

## 2020-07-04 NOTE — Telephone Encounter (Signed)
Last Visit: 06/29/2020 Next Visit: 01/02/2021 Labs: CBC 01/12/2020 normal, 11/08/2019 CMP normal  Current Dose per office note 06/29/2020, meloxicam 7.5 mg 1 tablet twice daily as needed for pain relief DX: Primary osteoarthritis of both hands Okay to refill MOBIC?

## 2020-07-12 ENCOUNTER — Encounter: Payer: 59 | Admitting: Family Medicine
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
## 2020-07-14 ENCOUNTER — Telehealth: Payer: Self-pay

## 2020-07-14 NOTE — Chronic Care Management (AMB) (Signed)
    Chronic Care Management Pharmacy Assistant   Name: Gloria Frazier  MRN: [id number] DOB: [birth year]  Reason for Encounter: Disease State/ General Adherence Call  PCP : Midge Minium, MD  Allergies:  No Known Allergies  Medications: Outpatient Encounter Medications as of 07/14/2020  Medication Sig Note  . ALPRAZolam (XANAX) 0.5 MG tablet Take 0.5 mg by mouth at bedtime as needed for anxiety.    . diclofenac sodium (VOLTAREN) 1 % GEL Apply 2 g to 4 g to affected joint up to 4 times daily PRN (Patient taking differently: Apply 1 application topically daily.)   . gabapentin (NEURONTIN) 300 MG capsule TAKE 1 CAPSULE BY MOUTH EVERYDAY AT BEDTIME (Patient taking differently: as needed.)   . meloxicam (MOBIC) 7.5 MG tablet TAKE 1 TABLET (7.5 MG) BY MOUTH TWICE A DAY AS NEEDED FOR PAIN   . methylphenidate (RITALIN) 10 MG tablet Take 1 tab as needed for additional symptom control   . methylphenidate 18 MG PO CR tablet Take 1 tablet (18 mg total) by mouth daily.   . Multiple Vitamin (MULTIVITAMIN) tablet Take 5 tablets by mouth daily.  01/26/2019: Packet of 5 tablets  . tamoxifen (NOLVADEX) 20 MG tablet TAKE 1 TABLET BY MOUTH DAILY STARTING ON 05/25/2019   . tretinoin (RETIN-A) 0.025 % cream     No facility-administered encounter medications on file as of 07/14/2020.    Current Diagnosis: Patient Active Problem List   Diagnosis Date Noted  . Malignant neoplasm of upper-outer quadrant of right breast in female, estrogen receptor positive ([place]) 01/08/2019  . Primary osteoarthritis of both hands 01/28/2017  . Primary osteoarthritis of both hips 01/28/2017  . Flat foot 01/28/2017  . Generalized hypermobility of joints 01/28/2017  . Abdominal pain, epigastric 12/13/2013  . Welcome to Medicare preventive visit 06/10/2013  . ADHD (attention deficit hyperactivity disorder), combined type 03/09/2012  . Hip arthritis 03/09/2012  . GERD (gastroesophageal reflux disease) 10/13/2011  . Bunion  of great toe 10/13/2011    Have you seen any other providers since your last visit with [name], Pharm.D., BCGP?  06/29/2020 [facility], Ofilia Neas, PA-C  Have you had any problems recently with your health?  Patient states she has not had any problems recently with her health.  Have you had any problems with your pharmacy?  Patient states she has not had any problems recently with her pharmacy.  What issues or side effects are you having with your medications?  Patient states she is not currently having any issues or side effects from her medications.  What would you like me to pass along to [name], [place].D., BCGP for them to help you with?   Patient states she would really like to know what is the protocol for treating Covid-19. Patient states if she were to be infected with Covid-19 what therapeutic medications are being used to treat it with? Patient would like to know if monoclonal antibodies, fluvoxamine, ivermectin or hydroxychloroquine are any go to medications to actively and currently treat Covid-19?   What can we do to take care of you better?  Patient states we are doing a great job at taking care of her and she really appreciates everything we do for her.   April D Calhoun, [place] Pharmacist Assistant [phone]   Follow-Up:  Pharmacist Review

## 2020-07-17 ENCOUNTER — Ambulatory Visit: Payer: Medicare Other

## 2020-07-17 ENCOUNTER — Ambulatory Visit (INDEPENDENT_AMBULATORY_CARE_PROVIDER_SITE_OTHER): Payer: Medicare Other

## 2020-07-17 VITALS — Ht 65.0 in | Wt 162.0 lb

## 2020-07-17 DIAGNOSIS — Z0000 Encounter for general adult medical examination without abnormal findings: Secondary | ICD-10-CM

## 2020-07-17 NOTE — Patient Instructions (Signed)
Gloria Frazier , Thank you for taking time to complete your Medicare Wellness Visit. I appreciate your ongoing commitment to your health goals. Please review the following plan we discussed and let me know if I can assist you in the future.   Screening recommendations/referrals: Colonoscopy: Completed 10/28/2017-Due-10/29/2022 Mammogram: Completed 01/13/2020-Due-01/12/2021 Bone Density: Per our conversation, completed at GYN office. Please have a copy of results sent to Dr. Birdie Riddle. Recommended yearly ophthalmology/optometry visit for glaucoma screening and checkup Recommended yearly dental visit for hygiene and checkup  Vaccinations: Influenza vaccine: Declined Pneumococcal vaccine: Up to date-Pneumovax-23 due-12/2020 Tdap vaccine: Discuss with pharmacy Shingles vaccine: Completed vaccines  Covid-19:Due for booster  Advanced directives: Copy in chart  Conditions/risks identified: See problem list  Next appointment: Follow up in one year for your annual wellness visit 07/23/2021 @ 8:15   Preventive Care 65 Years and Older, Female Preventive care refers to lifestyle choices and visits with your health care provider that can promote health and wellness. What does preventive care include?  A yearly physical exam. This is also called an annual well check.  Dental exams once or twice a year.  Routine eye exams. Ask your health care provider how often you should have your eyes checked.  Personal lifestyle choices, including:  Daily care of your teeth and gums.  Regular physical activity.  Eating a healthy diet.  Avoiding tobacco and drug use.  Limiting alcohol use.  Practicing safe sex.  Taking low-dose aspirin every day.  Taking vitamin and mineral supplements as recommended by your health care provider. What happens during an annual well check? The services and screenings done by your health care provider during your annual well check will depend on your age, overall health,  lifestyle risk factors, and family history of disease. Counseling  Your health care provider may ask you questions about your:  Alcohol use.  Tobacco use.  Drug use.  Emotional well-being.  Home and relationship well-being.  Sexual activity.  Eating habits.  History of falls.  Memory and ability to understand (cognition).  Work and work Statistician.  Reproductive health. Screening  You may have the following tests or measurements:  Height, weight, and BMI.  Blood pressure.  Lipid and cholesterol levels. These may be checked every 5 years, or more frequently if you are over 23 years old.  Skin check.  Lung cancer screening. You may have this screening every year starting at age 75 if you have a 30-pack-year history of smoking and currently smoke or have quit within the past 15 years.  Fecal occult blood test (FOBT) of the stool. You may have this test every year starting at age 33.  Flexible sigmoidoscopy or colonoscopy. You may have a sigmoidoscopy every 5 years or a colonoscopy every 10 years starting at age 65.  Hepatitis C blood test.  Hepatitis B blood test.  Sexually transmitted disease (STD) testing.  Diabetes screening. This is done by checking your blood sugar (glucose) after you have not eaten for a while (fasting). You may have this done every 1-3 years.  Bone density scan. This is done to screen for osteoporosis. You may have this done starting at age 44.  Mammogram. This may be done every 1-2 years. Talk to your health care provider about how often you should have regular mammograms. Talk with your health care provider about your test results, treatment options, and if necessary, the need for more tests. Vaccines  Your health care provider may recommend certain vaccines, such as:  Influenza  vaccine. This is recommended every year.  Tetanus, diphtheria, and acellular pertussis (Tdap, Td) vaccine. You may need a Td booster every 10 years.  Zoster  vaccine. You may need this after age 57.  Pneumococcal 13-valent conjugate (PCV13) vaccine. One dose is recommended after age 13.  Pneumococcal polysaccharide (PPSV23) vaccine. One dose is recommended after age 91. Talk to your health care provider about which screenings and vaccines you need and how often you need them. This information is not intended to replace advice given to you by your health care provider. Make sure you discuss any questions you have with your health care provider. Document Released: 07/07/2015 Document Revised: 02/28/2016 Document Reviewed: 04/11/2015 Elsevier Interactive Patient Education  2017 [place] Prevention in the Home Falls can cause injuries. They can happen to people of all ages. There are many things you can do to make your home safe and to help prevent falls. What can I do on the outside of my home?  Regularly fix the edges of walkways and driveways and fix any cracks.  Remove anything that might make you trip as you walk through a door, such as a raised step or threshold.  Trim any bushes or trees on the path to your home.  Use bright outdoor lighting.  Clear any walking paths of anything that might make someone trip, such as rocks or tools.  Regularly check to see if handrails are loose or broken. Make sure that both sides of any steps have handrails.  Any raised decks and porches should have guardrails on the edges.  Have any leaves, snow, or ice cleared regularly.  Use sand or salt on walking paths during winter.  Clean up any spills in your garage right away. This includes oil or grease spills. What can I do in the bathroom?  Use night lights.  Install grab bars by the toilet and in the tub and shower. Do not use towel bars as grab bars.  Use non-skid mats or decals in the tub or shower.  If you need to sit down in the shower, use a plastic, non-slip stool.  Keep the floor dry. Clean up any water that spills on the  floor as soon as it happens.  Remove soap buildup in the tub or shower regularly.  Attach bath mats securely with double-sided non-slip rug tape.  Do not have throw rugs and other things on the floor that can make you trip. What can I do in the bedroom?  Use night lights.  Make sure that you have a light by your bed that is easy to reach.  Do not use any sheets or blankets that are too big for your bed. They should not hang down onto the floor.  Have a firm chair that has side arms. You can use this for support while you get dressed.  Do not have throw rugs and other things on the floor that can make you trip. What can I do in the kitchen?  Clean up any spills right away.  Avoid walking on wet floors.  Keep items that you use a lot in easy-to-reach places.  If you need to reach something above you, use a strong step stool that has a grab bar.  Keep electrical cords out of the way.  Do not use floor polish or wax that makes floors slippery. If you must use wax, use non-skid floor wax.  Do not have throw rugs and other things on the floor that can  make you trip. What can I do with my stairs?  Do not leave any items on the stairs.  Make sure that there are handrails on both sides of the stairs and use them. Fix handrails that are broken or loose. Make sure that handrails are as long as the stairways.  Check any carpeting to make sure that it is firmly attached to the stairs. Fix any carpet that is loose or worn.  Avoid having throw rugs at the top or bottom of the stairs. If you do have throw rugs, attach them to the floor with carpet tape.  Make sure that you have a light switch at the top of the stairs and the bottom of the stairs. If you do not have them, ask someone to add them for you. What else can I do to help prevent falls?  Wear shoes that:  Do not have high heels.  Have rubber bottoms.  Are comfortable and fit you well.  Are closed at the toe. Do not wear  sandals.  If you use a stepladder:  Make sure that it is fully opened. Do not climb a closed stepladder.  Make sure that both sides of the stepladder are locked into place.  Ask someone to hold it for you, if possible.  Clearly mark and make sure that you can see:  Any grab bars or handrails.  First and last steps.  Where the edge of each step is.  Use tools that help you move around (mobility aids) if they are needed. These include:  Canes.  Walkers.  Scooters.  Crutches.  Turn on the lights when you go into a dark area. Replace any light bulbs as soon as they burn out.  Set up your furniture so you have a clear path. Avoid moving your furniture around.  If any of your floors are uneven, fix them.  If there are any pets around you, be aware of where they are.  Review your medicines with your doctor. Some medicines can make you feel dizzy. This can increase your chance of falling. Ask your doctor what other things that you can do to help prevent falls. This information is not intended to replace advice given to you by your health care provider. Make sure you discuss any questions you have with your health care provider. Document Released: 04/06/2009 Document Revised: 11/16/2015 Document Reviewed: 07/15/2014 Elsevier Interactive Patient Education  2017 [employer].
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
## 2020-07-28 ENCOUNTER — Other Ambulatory Visit: Payer: Self-pay

## 2020-07-28 ENCOUNTER — Encounter: Payer: Self-pay | Admitting: Family Medicine

## 2020-07-28 ENCOUNTER — Ambulatory Visit (INDEPENDENT_AMBULATORY_CARE_PROVIDER_SITE_OTHER): Payer: Medicare Other | Admitting: Family Medicine

## 2020-07-28 VITALS — BP 100/79 | HR 73 | Temp 98.0°F | Resp 16 | Ht 65.0 in | Wt 160.6 lb

## 2020-07-28 DIAGNOSIS — Z170 Estrogen receptor positive status [ER+]: Secondary | ICD-10-CM

## 2020-07-28 DIAGNOSIS — C50411 Malignant neoplasm of upper-outer quadrant of right female breast: Secondary | ICD-10-CM | POA: Diagnosis not present

## 2020-07-28 DIAGNOSIS — E663 Overweight: Secondary | ICD-10-CM | POA: Insufficient documentation

## 2020-07-28 DIAGNOSIS — F902 Attention-deficit hyperactivity disorder, combined type: Secondary | ICD-10-CM | POA: Diagnosis not present

## 2020-07-28 DIAGNOSIS — Z7189 Other specified counseling: Secondary | ICD-10-CM

## 2020-07-28 LAB — CBC WITH DIFFERENTIAL/PLATELET
Basophils Absolute: 0.1 10*3/uL (ref 0.0–0.1)
Basophils Relative: 0.8 % (ref 0.0–3.0)
Eosinophils Absolute: 0.2 10*3/uL (ref 0.0–0.7)
Eosinophils Relative: 2 % (ref 0.0–5.0)
HCT: 39.7 % (ref 36.0–46.0)
Hemoglobin: 13.2 g/dL (ref 12.0–15.0)
Lymphocytes Relative: 19.9 % (ref 12.0–46.0)
Lymphs Abs: 1.6 10*3/uL (ref 0.7–4.0)
MCHC: 33.2 g/dL (ref 30.0–36.0)
MCV: 92.3 fl (ref 78.0–100.0)
Monocytes Absolute: 0.6 10*3/uL (ref 0.1–1.0)
Monocytes Relative: 7.2 % (ref 3.0–12.0)
Neutro Abs: 5.6 10*3/uL (ref 1.4–7.7)
Neutrophils Relative %: 70.1 % (ref 43.0–77.0)
Platelets: 245 10*3/uL (ref 150.0–400.0)
RBC: 4.3 Mil/uL (ref 3.87–5.11)
RDW: 12.6 % (ref 11.5–15.5)
WBC: 7.9 10*3/uL (ref 4.0–10.5)

## 2020-07-28 LAB — HEPATIC FUNCTION PANEL
ALT: 26 U/L (ref 0–35)
AST: 29 U/L (ref 0–37)
Albumin: 4.2 g/dL (ref 3.5–5.2)
Alkaline Phosphatase: 53 U/L (ref 39–117)
Bilirubin, Direct: 0.1 mg/dL (ref 0.0–0.3)
Total Bilirubin: 0.4 mg/dL (ref 0.2–1.2)
Total Protein: 6.9 g/dL (ref 6.0–8.3)

## 2020-07-28 LAB — LIPID PANEL
Cholesterol: 187 mg/dL (ref 0–200)
HDL: 80.4 mg/dL (ref >39.00)
LDL Cholesterol: 91 mg/dL (ref 0–99)
NonHDL: 106.65
Total CHOL/HDL Ratio: 2
Triglycerides: 79 mg/dL (ref 0.0–149.0)
VLDL: 15.8 mg/dL (ref 0.0–40.0)

## 2020-07-28 LAB — BASIC METABOLIC PANEL
BUN: 17 mg/dL (ref 6–23)
CO2: 29 mEq/L (ref 19–32)
Calcium: 9.3 mg/dL (ref 8.4–10.5)
Chloride: 104 mEq/L (ref 96–112)
Creatinine, Ser: 0.82 mg/dL (ref 0.40–1.20)
GFR: 74.75 mL/min (ref >60.00)
Glucose, Bld: 88 mg/dL (ref 70–99)
Potassium: 3.8 mEq/L (ref 3.5–5.1)
Sodium: 139 mEq/L (ref 135–145)

## 2020-07-28 NOTE — Assessment & Plan Note (Signed)
Chronic problem.  On Tamoxifen w/ Dr Jana Hakim.  UTD on mammogram

## 2020-07-28 NOTE — Assessment & Plan Note (Signed)
Ongoing issue for pt.  BMI is 26.73  She is exercising regularly.  Encouraged healthy diet.  Check labs to risk stratify.  Will follow.

## 2020-07-28 NOTE — Progress Notes (Signed)
   Subjective:    Patient ID: Gloria Frazier, female    DOB: 03/12/1956, 65 y.o.   MRN: [id number]  HPI Overweight- pt's BMI is 26.73.  Last LDL 112.  Pt is exercising regularly- walking 12-15 miles/week, doing exercise classes.  No CP, SOB, HAs, abd pain, N/V.  ADHD- chronic problem, on Concerta 18mg  daily w/ Ritalin 10mg  as needed for additional control.  Medications work well when pt feels she needs to take them.  Does not take regularly.  Denies palpitations, anxiety, anorexia, insomnia.  Breast Cancer- following w/ Dr Jana Hakim.  On Tamoxifen 20mg  daily.  UTD on mammo  COVID questions- pt wants to know if we see sick people in the office, wants to know what the current COVID treatment protocols.  Has questions regarding testing- are rapid tests accurate?  How do they know what strain it is?  Health Maintenance- UTD on COVID, mammo, colonoscopy, pap.  Due for flu- declines.     Review of Systems For ROS see HPI   This visit occurred during the SARS-CoV-2 public health emergency.  Safety protocols were in place, including screening questions prior to the visit, additional usage of staff PPE, and extensive cleaning of exam room while observing appropriate contact time as indicated for disinfecting solutions.       Objective:   Physical Exam Vitals reviewed.  Constitutional:      General: She is not in acute distress.    Appearance: Normal appearance. She is well-developed and well-nourished.  HENT:     Head: Normocephalic and atraumatic.  Eyes:     Extraocular Movements: EOM normal.     Conjunctiva/sclera: Conjunctivae normal.     Pupils: Pupils are equal, round, and reactive to light.  Neck:     Thyroid: No thyromegaly.  Cardiovascular:     Rate and Rhythm: Normal rate and regular rhythm.     Pulses: Normal pulses and intact distal pulses.     Heart sounds: Normal heart sounds. No murmur heard.   Pulmonary:     Effort: Pulmonary effort is normal. No respiratory distress.      Breath sounds: Normal breath sounds.  Abdominal:     General: There is no distension.     Palpations: Abdomen is soft.     Tenderness: There is no abdominal tenderness.  Musculoskeletal:        General: No edema.     Cervical back: Normal range of motion and neck supple.     Right lower leg: No edema.     Left lower leg: No edema.  Lymphadenopathy:     Cervical: No cervical adenopathy.  Skin:    General: Skin is warm and dry.  Neurological:     General: No focal deficit present.     Mental Status: She is alert and oriented to person, place, and time.  Psychiatric:        Mood and Affect: Mood and affect and mood normal.        Behavior: Behavior normal.        Thought Content: Thought content normal.           Assessment & Plan:  COVID education- answered pt's questions on testing, treatment protocols, masking.  She felt that everything had been answered to her satisfaction.

## 2020-07-28 NOTE — Assessment & Plan Note (Signed)
Chronic problem.  On Concerta 18mg  and Ritalin 10mg  as needed for better focus.  She feels that when she takes her medication it does a good job of controlling symptoms but she doesn't need it every day.  Will follow.

## 2020-07-28 NOTE — Patient Instructions (Signed)
Follow up in 1 year or as needed We'll notify you of your lab results and make any changes if needed Continue to work on healthy diet and regular exercise- you're doing great!!! Call with any questions or concerns Stay Safe!! Stay Healthy!!! 

## 2020-07-30 ENCOUNTER — Other Ambulatory Visit: Payer: Self-pay | Admitting: Rheumatology

## 2020-07-31 NOTE — Telephone Encounter (Signed)
Last Visit: 06/29/2020 Next Visit: 01/02/2021 Labs:  07/28/2020, CBC/BMP normal  Current Dose per office note 06/29/2020, meloxicam 7.5 mg 1 tablet twice daily as needed for pain relief.  DX: Primary osteoarthritis of both hands  Okay to refill Meloxicam?

## 2020-07-31 NOTE — Progress Notes (Signed)
Results reviewed via Mychart.

## 2020-08-22 ENCOUNTER — Encounter: Payer: Self-pay | Admitting: Family Medicine

## 2020-08-22 ENCOUNTER — Telehealth: Payer: Self-pay

## 2020-08-22 NOTE — Chronic Care Management (AMB) (Signed)
    Chronic Care Management Pharmacy Assistant   Name: Gloria Frazier  MRN: [id number] DOB: 1955-08-17  Reason for Encounter: Disease State/ General Adherence Call  PCP : Midge Minium, MD  Allergies:  No Known Allergies  Medications: Outpatient Encounter Medications as of 08/22/2020  Medication Sig Note  . ALPRAZolam (XANAX) 0.5 MG tablet Take 0.5 mg by mouth at bedtime as needed for anxiety.    . diclofenac sodium (VOLTAREN) 1 % GEL Apply 2 g to 4 g to affected joint up to 4 times daily PRN (Patient taking differently: Apply 1 application topically daily.)   . gabapentin (NEURONTIN) 300 MG capsule TAKE 1 CAPSULE BY MOUTH EVERYDAY AT BEDTIME (Patient taking differently: as needed.)   . meloxicam (MOBIC) 7.5 MG tablet TAKE 1 TABLET (7.5 MG) BY MOUTH TWICE A DAY AS NEEDED FOR PAIN   . methylphenidate (RITALIN) 10 MG tablet Take 1 tab as needed for additional symptom control   . methylphenidate 18 MG PO CR tablet Take 1 tablet (18 mg total) by mouth daily.   . Multiple Vitamin (MULTIVITAMIN) tablet Take 5 tablets by mouth daily.  01/26/2019: Packet of 5 tablets  . tamoxifen (NOLVADEX) 20 MG tablet TAKE 1 TABLET BY MOUTH DAILY STARTING ON 05/25/2019   . tretinoin (RETIN-A) 0.025 % cream     No facility-administered encounter medications on file as of 08/22/2020.    Current Diagnosis: Patient Active Problem List   Diagnosis Date Noted  . Overweight (BMI 25.0-29.9) 07/28/2020  . Malignant neoplasm of upper-outer quadrant of right breast in female, estrogen receptor positive ([place]) 01/08/2019  . Primary osteoarthritis of both hands 01/28/2017  . Primary osteoarthritis of both hips 01/28/2017  . Flat foot 01/28/2017  . Generalized hypermobility of joints 01/28/2017  . Abdominal pain, epigastric 12/13/2013  . Welcome to Medicare preventive visit 06/10/2013  . ADHD (attention deficit hyperactivity disorder), combined type 03/09/2012  . Hip arthritis 03/09/2012  . GERD (gastroesophageal  reflux disease) 10/13/2011  . Bunion of great toe 10/13/2011    Have you seen any other providers since your last visit with Gloria Frazier, Pharm.D., BCGP?  07/28/2020 OV PCP Dr. Birdie Riddle; chronic follow up, no medication changes indicated.  Have you had any problems recently with your health?  Patient states she has not had any problems recently with her health.  Have you had any problems with your pharmacy?  Patient states she has not had any problems recently with her pharmacy.  What issues or side effects are you having with your medications?  Patient states she has not had any issues or side effects with any of her medications.  What would you like me to pass along to Gloria Frazier, [place].D., BCGP for him to help you with?   Patient states she does not have anything to pass along at this time.  What can we do to take care of you better?  Patient states she is doing well at this time.  Future Appointments  Date Time Provider Gloria Frazier  11/09/2020  9:00 AM [facility] None  11/09/2020  9:30 AM Magrinat, Gloria Dad, MD [facility] None  01/02/2021  8:00 AM Bo Merino, MD [facility] None  07/23/2021  8:15 AM Gloria Frazier [facility]  07/30/2021  7:30 AM Midge Minium, MD [facility]    Gloria Frazier, [place] Pharmacist Assistant [phone]    Follow-Up:  Pharmacist Review

## 2020-08-29 ENCOUNTER — Other Ambulatory Visit: Payer: Self-pay | Admitting: Oncology

## 2020-10-05 ENCOUNTER — Encounter: Payer: Self-pay | Admitting: Family Medicine

## 2020-10-05 ENCOUNTER — Other Ambulatory Visit: Payer: Self-pay

## 2020-10-05 ENCOUNTER — Other Ambulatory Visit: Payer: Self-pay | Admitting: Family Medicine

## 2020-10-05 ENCOUNTER — Ambulatory Visit (INDEPENDENT_AMBULATORY_CARE_PROVIDER_SITE_OTHER): Payer: Medicare Other | Admitting: Family Medicine

## 2020-10-05 VITALS — BP 122/78 | HR 92 | Temp 100.6°F | Wt 162.2 lb

## 2020-10-05 DIAGNOSIS — S46811A Strain of other muscles, fascia and tendons at shoulder and upper arm level, right arm, initial encounter: Secondary | ICD-10-CM | POA: Diagnosis not present

## 2020-10-05 DIAGNOSIS — R319 Hematuria, unspecified: Secondary | ICD-10-CM

## 2020-10-05 DIAGNOSIS — S46812A Strain of other muscles, fascia and tendons at shoulder and upper arm level, left arm, initial encounter: Secondary | ICD-10-CM | POA: Diagnosis not present

## 2020-10-05 DIAGNOSIS — R509 Fever, unspecified: Secondary | ICD-10-CM | POA: Diagnosis not present

## 2020-10-05 DIAGNOSIS — M546 Pain in thoracic spine: Secondary | ICD-10-CM

## 2020-10-05 LAB — POCT URINALYSIS DIPSTICK
Bilirubin, UA: NEGATIVE
Glucose, UA: NEGATIVE
Ketones, UA: NEGATIVE
Leukocytes, UA: NEGATIVE
Nitrite, UA: NEGATIVE
Protein, UA: POSITIVE — AB
Spec Grav, UA: >=1.03 — AB (ref 1.010–1.025)
Urobilinogen, UA: NEGATIVE E.U./dL — AB
pH, UA: 6 (ref 5.0–8.0)

## 2020-10-05 MED ORDER — CYCLOBENZAPRINE HCL 5 MG PO TABS: 5.0000 mg | ORAL_TABLET | Freq: Every evening | ORAL | 0 refills | Status: DC | PRN

## 2020-10-05 NOTE — Patient Instructions (Addendum)
Acute Back Pain, Adult Acute back pain is sudden and usually short-lived. It is often caused by an injury to the muscles and tissues in the back. The injury may result from:  A muscle or ligament getting overstretched or torn (strained). Ligaments are tissues that connect bones to each other. Lifting something improperly can cause a back strain.  Wear and tear (degeneration) of the spinal disks. Spinal disks are circular tissue that provide cushioning between the bones of the spine (vertebrae).  Twisting motions, such as while playing sports or doing yard work.  A hit to the back.  Arthritis. You may have a physical exam, lab tests, and imaging tests to find the cause of your pain. Acute back pain usually goes away with rest and home care. Follow these instructions at home: Managing pain, stiffness, and swelling  Treatment may include medicines for pain and inflammation that are taken by mouth or applied to the skin, prescription pain medicine, or muscle relaxants. Take over-the-counter and prescription medicines only as told by your health care provider.  Your health care provider may recommend applying ice during the first 24-48 hours after your pain starts. To do this: ? Put ice in a plastic bag. ? Place a towel between your skin and the bag. ? Leave the ice on for 20 minutes, 2-3 times a day.  If directed, apply heat to the affected area as often as told by your health care provider. Use the heat source that your health care provider recommends, such as a moist heat pack or a heating pad. ? Place a towel between your skin and the heat source. ? Leave the heat on for 20-30 minutes. ? Remove the heat if your skin turns bright red. This is especially important if you are unable to feel pain, heat, or cold. You have a greater risk of getting burned. Activity  Do not stay in bed. Staying in bed for more than 1-2 days can delay your recovery.  Sit up and stand up straight. Avoid leaning  forward when you sit or hunching over when you stand. ? If you work at a desk, sit close to it so you do not need to lean over. Keep your chin tucked in. Keep your neck drawn back, and keep your elbows bent at a 90-degree angle (right angle). ? Sit high and close to the steering wheel when you drive. Add lower back (lumbar) support to your car seat, if needed.  Take short walks on even surfaces as soon as you are able. Try to increase the length of time you walk each day.  Do not sit, drive, or stand in one place for more than 30 minutes at a time. Sitting or standing for long periods of time can put stress on your back.  Do not drive or use heavy machinery while taking prescription pain medicine.  Use proper lifting techniques. When you bend and lift, use positions that put less stress on your back: ? Bend your knees. ? Keep the load close to your body. ? Avoid twisting.  Exercise regularly as told by your health care provider. Exercising helps your back heal faster and helps prevent back injuries by keeping muscles strong and flexible.  Work with a physical therapist to make a safe exercise program, as recommended by your health care provider. Do any exercises as told by your physical therapist.   Lifestyle  Maintain a healthy weight. Extra weight puts stress on your back and makes it difficult to have   good posture.  Avoid activities or situations that make you feel anxious or stressed. Stress and anxiety increase muscle tension and can make back pain worse. Learn ways to manage anxiety and stress, such as through exercise. General instructions  Sleep on a firm mattress in a comfortable position. Try lying on your side with your knees slightly bent. If you lie on your back, put a pillow under your knees.  Follow your treatment plan as told by your health care provider. This may include: ? Cognitive or behavioral therapy. ? Acupuncture or massage therapy. ? Meditation or yoga. Contact  a health care provider if:  You have pain that is not relieved with rest or medicine.  You have increasing pain going down into your legs or buttocks.  Your pain does not improve after 2 weeks.  You have pain at night.  You lose weight without trying.  You have a fever or chills. Get help right away if:  You develop new bowel or bladder control problems.  You have unusual weakness or numbness in your arms or legs.  You develop nausea or vomiting.  You develop abdominal pain.  You feel faint. Summary  Acute back pain is sudden and usually short-lived.  Use proper lifting techniques. When you bend and lift, use positions that put less stress on your back.  Take over-the-counter and prescription medicines and apply heat or ice as directed by your health care provider. This information is not intended to replace advice given to you by your health care provider. Make sure you discuss any questions you have with your health care provider. Document Revised: 03/03/2020 Document Reviewed: 03/03/2020 Elsevier Patient Education  2021 [place].  Thoracic Strain Rehab Ask your health care provider which exercises are safe for you. Do exercises exactly as told by your health care provider and adjust them as directed. It is normal to feel mild stretching, pulling, tightness, or discomfort as you do these exercises. Stop right away if you feel sudden pain or your pain gets worse. Do not begin these exercises until told by your health care provider. Stretching and range-of-motion exercise This exercise warms up your muscles and joints and improves the movement and flexibility of your back and shoulders. This exercise also helps to relieve pain. Chest and spine stretch 1. Lie down on your back on a firm surface. 2. Roll a towel or a small blanket so it is about 4 inches (10 cm) in diameter. 3. Put the towel lengthwise under the middle of your back so it is under your spine, but not under  your shoulder blades. 4. Put your hands behind your head and let your elbows fall to your sides. This will increase your stretch. 5. Take a deep breath (inhale). 6. Hold for __________ seconds. 7. Relax after you breathe out (exhale). Repeat __________ times. Complete this exercise __________ times a day.   Strengthening exercises These exercises build strength and endurance in your back and your shoulder blade muscles. Endurance is the ability to use your muscles for a long time, even after they get tired. Alternating arm and leg raises 1. Get on your hands and knees on a firm surface. If you are on a hard floor, you may want to use padding, such as an exercise mat, to cushion your knees. 2. Line up your arms and legs. Your hands should be directly below your shoulders, and your knees should be directly below your hips. 3. Lift your left leg behind you. At the  same time, raise your right arm and straighten it in front of you. ? Do not lift your leg higher than your hip. ? Do not lift your arm higher than your shoulder. ? Keep your abdominal and back muscles tight. ? Keep your hips facing the ground. ? Do not arch your back. ? Keep your balance carefully, and do not hold your breath. 4. Hold for __________ seconds. 5. Slowly return to the starting position and repeat with your right leg and your left arm. Repeat __________ times. Complete this exercise __________ times a day.   Straight arm rows This exercise is also called shoulder extension exercise. 1. Stand with your feet shoulder width apart. 2. Secure an exercise band to a stable object in front of you so the band is at or above shoulder height. 3. Hold one end of the exercise band in each hand. 4. Straighten your elbows and lift your hands up to shoulder height. 5. Step back, away from the secured end of the exercise band, until the band stretches. 6. Squeeze your shoulder blades together and pull your hands down to the sides of  your thighs. Stop when your hands are straight down by your sides. This is shoulder extension. Do not let your hands go behind your body. 7. Hold for __________ seconds. 8. Slowly return to the starting position. Repeat __________ times. Complete this exercise __________ times a day.   Prone shoulder external rotation 1. Lie on your abdomen on a firm bed so your left / right forearm hangs over the edge of the bed and your upper arm is on the bed, straight out from your body. This is the prone position. ? Your elbow should be bent. ? Your palm should be facing your feet. 2. If instructed, hold a __________ weight in your hand. 3. Squeeze your shoulder blade toward the middle of your back. Do not let your shoulder lift toward your ear. 4. Keep your elbow bent in a 90-degree angle (right angle) while you slowly move your forearm up toward the ceiling. Move your forearm up to the height of the bed, toward your head. This is external rotation. ? Your upper arm should not move. ? At the top of the movement, your palm should face the floor. 5. Hold for __________ seconds. 6. Slowly return to the starting position and relax your muscles. Repeat __________ times. Complete this exercise __________ times a day. Rowing scapular retraction This is an exercise in which the shoulder blades (scapulae) are pulled toward each other (retraction). 1. Sit in a stable chair without armrests, or stand up. 2. Secure an exercise band to a stable object in front of you so the band is at shoulder height. 3. Hold one end of the exercise band in each hand. Your palms should face down. 4. Bring your arms out straight in front of you. 5. Step back, away from the secured end of the exercise band, until the band stretches. 6. Pull the band backward. As you do this, bend your elbows and squeeze your shoulder blades together, but avoid letting the rest of your body move. Do not shrug your shoulders upward while you do  this. 7. Stop when your elbows are at your sides or slightly behind your body. 8. Hold for __________ seconds. 9. Slowly straighten your arms to return to the starting position. Repeat __________ times. Complete this exercise __________ times a day.   Posture and body mechanics Good posture and healthy body mechanics can help  to relieve stress in your body's tissues and joints. Body mechanics refers to the movements and positions of your body while you do your daily activities. Posture is part of body mechanics. Good posture means:  Your spine is in its natural S-curve position (neutral).  Your shoulders are pulled back slightly.  Your head is not tipped forward. Follow these guidelines to improve your posture and body mechanics in your everyday activities. Standing  When standing, keep your spine neutral and your feet about hip width apart. Keep a slight bend in your knees. Your ears, shoulders, and hips should line up with each other.  When you do a task in which you lean forward while standing in one place for a long time, place one foot up on a stable object that is 2-4 inches (5-10 cm) high, such as a footstool. This helps keep your spine neutral.   Sitting  When sitting, keep your spine neutral and keep your feet flat on the floor. Use a footrest, if necessary, and keep your thighs parallel to the floor. Avoid rounding your shoulders, and avoid tilting your head forward.  When working at a desk or a computer, keep your desk at a height where your hands are slightly lower than your elbows. Slide your chair under your desk so you are close enough to maintain good posture.  When working at a computer, place your monitor at a height where you are looking straight ahead and you do not have to tilt your head forward or downward to look at the screen.   Resting When lying down and resting, avoid positions that are most painful for you.  If you have pain with activities such as sitting,  bending, stooping, or squatting (flexion-basedactivities), lie in a position in which your body does not bend very much. For example, avoid curling up on your side with your arms and knees near your chest (fetal position).  If you have pain with activities such as standing for a long time or reaching with your arms (extension-basedactivities), lie with your spine in a neutral position and bend your knees slightly. Try the following positions: ? Lie on your side with a pillow between your knees. ? Lie on your back with a pillow under your knees.   Lifting  When lifting objects, keep your feet at least shoulder width apart and tighten your abdominal muscles.  Bend your knees and hips and keep your spine neutral. It is important to lift using the strength of your legs, not your back. Do not lock your knees straight out.  Always ask for help to lift heavy or awkward objects.   This information is not intended to replace advice given to you by your health care provider. Make sure you discuss any questions you have with your health care provider. Document Revised: 10/02/2018 Document Reviewed: 07/20/2018 Elsevier Patient Education  2021 [place].  Hematuria, Adult Hematuria is blood in the urine. Blood may be visible in the urine, or it may be identified with a test. This condition can be caused by infections of the bladder, urethra, kidney, or prostate. Other possible causes include:  Kidney stones.  Cancer of the urinary tract.  Too much calcium in the urine.  Conditions that are passed from parent to child (inherited conditions).  Exercise that requires a lot of energy. Infections can usually be treated with medicine, and a kidney stone usually will pass through your urine. If neither of these is the cause of your hematuria, more  tests may be needed to identify the cause of your symptoms. It is very important to tell your health care provider about any blood in your urine, even if it  is painless or the blood stops without treatment. Blood in the urine, when it happens and then stops and then happens again, can be a symptom of a very serious condition, including cancer. There is no pain in the initial stages of many urinary cancers. Follow these instructions at home: Medicines  Take over-the-counter and prescription medicines only as told by your health care provider.  If you were prescribed an antibiotic medicine, take it as told by your health care provider. Do not stop taking the antibiotic even if you start to feel better. Eating and drinking  Drink enough fluid to keep your urine pale yellow. It is recommended that you drink 3-4 quarts (2.8-3.8 L) a day. If you have been diagnosed with an infection, drinking cranberry juice in addition to large amounts of water is recommended.  Avoid caffeine, tea, and carbonated beverages. These tend to irritate the bladder.  Avoid alcohol because it may irritate the prostate (in males). General instructions  If you have been diagnosed with a kidney stone, follow your health care provider's instructions about straining your urine to catch the stone.  Empty your bladder often. Avoid holding urine for long periods of time.  If you are female: ? After a bowel movement, wipe from front to back and use each piece of toilet paper only once. ? Empty your bladder before and after sex.  Pay attention to any changes in your symptoms. Tell your health care provider about any changes or any new symptoms.  It is up to you to get the results of any tests. Ask your health care provider, or the department that is doing the test, when your results will be ready.  Keep all follow-up visits. This is important. Contact a health care provider if:  You develop back pain.  You have a fever or chills.  You have nausea or vomiting.  Your symptoms do not improve after 3 days.  Your symptoms get worse. Get help right away if:  You develop  severe vomiting and are unable to take medicine without vomiting.  You develop severe pain in your back or abdomen even though you are taking medicine.  You pass a large amount of blood in your urine.  You pass blood clots in your urine.  You feel very weak or like you might faint.  You faint. Summary  Hematuria is blood in the urine. It has many possible causes.  It is very important that you tell your health care provider about any blood in your urine, even if it is painless or the blood stops without treatment.  Take over-the-counter and prescription medicines only as told by your health care provider.  Drink enough fluid to keep your urine pale yellow. This information is not intended to replace advice given to you by your health care provider. Make sure you discuss any questions you have with your health care provider. Document Revised: 02/09/2020 Document Reviewed: 02/09/2020 Elsevier Patient Education  2021 [employer].
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
## 2020-10-09 LAB — URINE CULTURE
MICRO NUMBER:: 11772649
SPECIMEN QUALITY:: ADEQUATE

## 2020-10-09 LAB — HOUSE ACCOUNT TRACKING

## 2020-10-10 ENCOUNTER — Encounter: Payer: Self-pay | Admitting: Family Medicine

## 2020-10-11 ENCOUNTER — Telehealth: Payer: Self-pay | Admitting: Family Medicine

## 2020-10-11 NOTE — Telephone Encounter (Signed)
..  Type of form received:  Coverage Determination  Additional comments:   Received by:  Tye Savoy should be Faxed to:  [phone]  Form should be mailed to:    Is patient requesting call for pickup:   Form placed:  In Dr. Virgil Benedict bin up front Attach charge sheet.  Provider will determine charge.  Individual made aware of 3-5 business day turn around (Y/N)?

## 2020-10-12 ENCOUNTER — Ambulatory Visit (INDEPENDENT_AMBULATORY_CARE_PROVIDER_SITE_OTHER): Payer: Medicare Other | Admitting: Family Medicine

## 2020-10-12 ENCOUNTER — Other Ambulatory Visit: Payer: Self-pay

## 2020-10-12 ENCOUNTER — Encounter: Payer: Self-pay | Admitting: Family Medicine

## 2020-10-12 ENCOUNTER — Telehealth: Payer: Self-pay | Admitting: Family Medicine

## 2020-10-12 VITALS — BP 130/74 | HR 69 | Temp 98.7°F | Resp 15 | Ht 65.0 in | Wt 160.4 lb

## 2020-10-12 DIAGNOSIS — R319 Hematuria, unspecified: Secondary | ICD-10-CM | POA: Insufficient documentation

## 2020-10-12 LAB — POCT URINALYSIS DIPSTICK
Bilirubin, UA: NEGATIVE
Blood, UA: POSITIVE
Glucose, UA: NEGATIVE
Ketones, UA: POSITIVE
Leukocytes, UA: NEGATIVE
Nitrite, UA: NEGATIVE
Protein, UA: NEGATIVE
Spec Grav, UA: <=1.005 — AB (ref 1.010–1.025)
Urobilinogen, UA: 0.2 E.U./dL
pH, UA: 6 (ref 5.0–8.0)

## 2020-10-12 NOTE — Patient Instructions (Signed)
Follow up as needed or as scheduled We'll notify you of your urine culture and treat if needed We'll call you with your Urology appt to evaluate the small amount of microscopic blood Drink LOTS of water Call with any questions or concerns Hang in there!!!

## 2020-10-12 NOTE — Progress Notes (Signed)
   Subjective:    Patient ID: Gloria Frazier, female    DOB: 12-Jan-1956, 65 y.o.   MRN: [id number]  HPI Thoracic back pain- pt saw Dr Volanda Napoleon on 4/14 and by Monday 4/19 pain was mostly gone.  'i've had no problem this week'.  Hematuria- pt had blood in urine at appt last week.  No visible blood.  Denies change in frequency, no urgency.  No burning w/ urination.  Pt had 1+ blood last week, trace blood w/ leuks in Jan 2020 when she had UTI sxs.   Review of Systems For ROS see HPI   This visit occurred during the SARS-CoV-2 public health emergency.  Safety protocols were in place, including screening questions prior to the visit, additional usage of staff PPE, and extensive cleaning of exam room while observing appropriate contact time as indicated for disinfecting solutions.       Objective:   Physical Exam Vitals reviewed.  Constitutional:      General: She is not in acute distress.    Appearance: Normal appearance. She is not ill-appearing.  HENT:     Head: Normocephalic and atraumatic.  Abdominal:     General: Abdomen is flat. There is no distension.     Palpations: Abdomen is soft.     Tenderness: There is no abdominal tenderness. There is no right CVA tenderness, left CVA tenderness, guarding or rebound.  Skin:    General: Skin is warm and dry.  Neurological:     General: No focal deficit present.     Mental Status: She is alert and oriented to person, place, and time.  Psychiatric:        Mood and Affect: Mood normal.        Behavior: Behavior normal.        Thought Content: Thought content normal.           Assessment & Plan:  Thoracic back pain- resolved.  No further treatment or work up needed  Hematuria- trace to 1+ over the last week.  Wonder if this is truly hemoglobin or actually myoglobin due to the severity of muscle aches/spasm.  Unfortunately we don't have a microscope to assess the difference but will refer to urology for complete evaluation.  Pt expressed  understanding and is in agreement w/ plan.

## 2020-10-12 NOTE — Telephone Encounter (Signed)
Called patient back with PCP information. Patient voiced understanding.

## 2020-10-12 NOTE — Telephone Encounter (Signed)
In people who do not have diabetes, ketones are found in urine if your body is using fat for fuel rather than sugar.  This can be seen in low carb diets or in people who were fasting.  No cause for concern

## 2020-10-12 NOTE — Telephone Encounter (Signed)
Medication no longer needed.  Forms discarded

## 2020-10-12 NOTE — Telephone Encounter (Signed)
Patient wants to know what her urine results mean - she would like to discuss the finding of ketones.  Please advise

## 2020-10-13 ENCOUNTER — Encounter: Payer: Self-pay | Admitting: Family Medicine

## 2020-10-13 ENCOUNTER — Other Ambulatory Visit: Payer: Self-pay | Admitting: Family Medicine

## 2020-10-13 MED ORDER — CEPHALEXIN 500 MG PO CAPS: 500.0000 mg | ORAL_CAPSULE | Freq: Two times a day (BID) | ORAL | 0 refills | Status: AC

## 2020-10-18 ENCOUNTER — Other Ambulatory Visit: Payer: Self-pay

## 2020-10-18 ENCOUNTER — Other Ambulatory Visit: Payer: Medicare Other

## 2020-10-18 ENCOUNTER — Encounter: Payer: Self-pay | Admitting: Family Medicine

## 2020-10-18 ENCOUNTER — Other Ambulatory Visit (INDEPENDENT_AMBULATORY_CARE_PROVIDER_SITE_OTHER): Payer: Medicare Other

## 2020-10-18 DIAGNOSIS — R319 Hematuria, unspecified: Secondary | ICD-10-CM

## 2020-10-18 LAB — POCT URINALYSIS DIPSTICK
Bilirubin, UA: NEGATIVE
Blood, UA: NEGATIVE
Glucose, UA: NEGATIVE
Ketones, UA: NEGATIVE
Leukocytes, UA: NEGATIVE
Nitrite, UA: NEGATIVE
Protein, UA: NEGATIVE
Spec Grav, UA: 1.01 (ref 1.010–1.025)
Urobilinogen, UA: 0.2 E.U./dL
pH, UA: 5 (ref 5.0–8.0)

## 2020-10-19 LAB — URINE CULTURE
MICRO NUMBER:: 11822208
Result:: NO GROWTH
SPECIMEN QUALITY:: ADEQUATE

## 2020-10-23 ENCOUNTER — Encounter: Payer: Self-pay | Admitting: Family Medicine

## 2020-10-24 ENCOUNTER — Encounter: Payer: Self-pay | Admitting: Family Medicine

## 2020-10-31 ENCOUNTER — Other Ambulatory Visit: Payer: Self-pay | Admitting: Family Medicine

## 2020-10-31 DIAGNOSIS — S46811A Strain of other muscles, fascia and tendons at shoulder and upper arm level, right arm, initial encounter: Secondary | ICD-10-CM

## 2020-10-31 DIAGNOSIS — S46812A Strain of other muscles, fascia and tendons at shoulder and upper arm level, left arm, initial encounter: Secondary | ICD-10-CM

## 2020-10-31 DIAGNOSIS — M546 Pain in thoracic spine: Secondary | ICD-10-CM

## 2020-11-07 ENCOUNTER — Other Ambulatory Visit: Payer: Self-pay | Admitting: Oncology

## 2020-11-07 ENCOUNTER — Other Ambulatory Visit: Payer: Self-pay | Admitting: Rheumatology

## 2020-11-07 NOTE — Telephone Encounter (Signed)
Next Visit: 01/02/2021  Last Visit: 06/29/2020  Last Fill: 07/31/2020  DX:   Primary osteoarthritis of both hands  Current Dose per office note 06/29/2020,  meloxicam 7.5 mg 1 tablet twice daily as needed for pain relief  Labs: 07/28/2020, LDL 112,   Okay to refill Mobic?
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
## 2020-11-09 ENCOUNTER — Inpatient Hospital Stay: Payer: Medicare Other | Attending: Oncology | Admitting: Oncology

## 2020-11-09 ENCOUNTER — Inpatient Hospital Stay: Payer: Medicare Other

## 2020-11-09 ENCOUNTER — Telehealth: Payer: Self-pay | Admitting: Oncology

## 2020-11-09 ENCOUNTER — Other Ambulatory Visit: Payer: Self-pay

## 2020-11-09 VITALS — BP 134/71 | HR 63 | Temp 97.3°F | Resp 17 | Wt 161.4 lb

## 2020-11-09 DIAGNOSIS — Z79899 Other long term (current) drug therapy: Secondary | ICD-10-CM | POA: Diagnosis not present

## 2020-11-09 DIAGNOSIS — Z170 Estrogen receptor positive status [ER+]: Secondary | ICD-10-CM | POA: Diagnosis not present

## 2020-11-09 DIAGNOSIS — Z9079 Acquired absence of other genital organ(s): Secondary | ICD-10-CM | POA: Insufficient documentation

## 2020-11-09 DIAGNOSIS — C50411 Malignant neoplasm of upper-outer quadrant of right female breast: Secondary | ICD-10-CM

## 2020-11-09 DIAGNOSIS — Z79810 Long term (current) use of selective estrogen receptor modulators (SERMs): Secondary | ICD-10-CM | POA: Insufficient documentation

## 2020-11-09 DIAGNOSIS — Z90710 Acquired absence of both cervix and uterus: Secondary | ICD-10-CM | POA: Insufficient documentation

## 2020-11-09 DIAGNOSIS — Z923 Personal history of irradiation: Secondary | ICD-10-CM | POA: Diagnosis not present

## 2020-11-09 DIAGNOSIS — K219 Gastro-esophageal reflux disease without esophagitis: Secondary | ICD-10-CM

## 2020-11-09 DIAGNOSIS — M160 Bilateral primary osteoarthritis of hip: Secondary | ICD-10-CM

## 2020-11-09 DIAGNOSIS — Z90722 Acquired absence of ovaries, bilateral: Secondary | ICD-10-CM | POA: Diagnosis not present

## 2020-11-09 LAB — CBC WITH DIFFERENTIAL/PLATELET
Abs Immature Granulocytes: 0.02 10*3/uL (ref 0.00–0.07)
Basophils Absolute: 0 10*3/uL (ref 0.0–0.1)
Basophils Relative: 1 %
Eosinophils Absolute: 0.2 10*3/uL (ref 0.0–0.5)
Eosinophils Relative: 4 %
HCT: 40.5 % (ref 36.0–46.0)
Hemoglobin: 13.2 g/dL (ref 12.0–15.0)
Immature Granulocytes: 0 %
Lymphocytes Relative: 22 %
Lymphs Abs: 1.4 10*3/uL (ref 0.7–4.0)
MCH: 29.9 pg (ref 26.0–34.0)
MCHC: 32.6 g/dL (ref 30.0–36.0)
MCV: 91.8 fL (ref 80.0–100.0)
Monocytes Absolute: 0.5 10*3/uL (ref 0.1–1.0)
Monocytes Relative: 8 %
Neutro Abs: 4.2 10*3/uL (ref 1.7–7.7)
Neutrophils Relative %: 65 %
Platelets: 262 10*3/uL (ref 150–400)
RBC: 4.41 MIL/uL (ref 3.87–5.11)
RDW: 12 % (ref 11.5–15.5)
WBC: 6.3 10*3/uL (ref 4.0–10.5)
nRBC: 0 % (ref 0.0–0.2)

## 2020-11-09 LAB — COMPREHENSIVE METABOLIC PANEL
ALT: 21 U/L (ref 0–44)
AST: 26 U/L (ref 15–41)
Albumin: 3.7 g/dL (ref 3.5–5.0)
Alkaline Phosphatase: 60 U/L (ref 38–126)
Anion gap: 8 (ref 5–15)
BUN: 19 mg/dL (ref 8–23)
CO2: 27 mmol/L (ref 22–32)
Calcium: 9.2 mg/dL (ref 8.9–10.3)
Chloride: 107 mmol/L (ref 98–111)
Creatinine, Ser: 0.81 mg/dL (ref 0.44–1.00)
GFR, Estimated: >60 mL/min (ref >60)
Glucose, Bld: 89 mg/dL (ref 70–99)
Potassium: 4.2 mmol/L (ref 3.5–5.1)
Sodium: 142 mmol/L (ref 135–145)
Total Bilirubin: 0.5 mg/dL (ref 0.3–1.2)
Total Protein: 6.9 g/dL (ref 6.5–8.1)

## 2020-11-09 MED ORDER — VENLAFAXINE HCL ER 37.5 MG PO CP24: 37.5000 mg | ORAL_CAPSULE | Freq: Every day | ORAL | 4 refills | Status: DC

## 2020-11-09 MED ORDER — TAMOXIFEN CITRATE 20 MG PO TABS
20.0000 mg | ORAL_TABLET | Freq: Every day | ORAL | 4 refills | Status: DC
Start: 2020-11-09 — End: 2021-06-11

## 2020-11-09 NOTE — Telephone Encounter (Signed)
Scheduled appointment per 05/19 los. Patient is aware. 
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
## 2020-12-20 ENCOUNTER — Encounter: Payer: Self-pay | Admitting: *Deleted

## 2020-12-28 ENCOUNTER — Other Ambulatory Visit: Payer: Self-pay | Admitting: Oncology

## 2020-12-28 DIAGNOSIS — Z9889 Other specified postprocedural states: Secondary | ICD-10-CM
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
## 2021-01-02 ENCOUNTER — Ambulatory Visit: Payer: No Typology Code available for payment source | Admitting: Rheumatology

## 2021-01-15 ENCOUNTER — Other Ambulatory Visit: Payer: Self-pay

## 2021-01-15 ENCOUNTER — Ambulatory Visit (INDEPENDENT_AMBULATORY_CARE_PROVIDER_SITE_OTHER): Payer: Medicare Other | Admitting: Physician Assistant

## 2021-01-15 ENCOUNTER — Encounter: Payer: Self-pay | Admitting: Physician Assistant

## 2021-01-15 VITALS — BP 117/77 | HR 66 | Ht 65.0 in | Wt 162.2 lb

## 2021-01-15 DIAGNOSIS — M19041 Primary osteoarthritis, right hand: Secondary | ICD-10-CM | POA: Diagnosis not present

## 2021-01-15 DIAGNOSIS — M21619 Bunion of unspecified foot: Secondary | ICD-10-CM

## 2021-01-15 DIAGNOSIS — M2141 Flat foot [pes planus] (acquired), right foot: Secondary | ICD-10-CM

## 2021-01-15 DIAGNOSIS — M7711 Lateral epicondylitis, right elbow: Secondary | ICD-10-CM

## 2021-01-15 DIAGNOSIS — M1612 Unilateral primary osteoarthritis, left hip: Secondary | ICD-10-CM

## 2021-01-15 DIAGNOSIS — Z96641 Presence of right artificial hip joint: Secondary | ICD-10-CM

## 2021-01-15 DIAGNOSIS — M19042 Primary osteoarthritis, left hand: Secondary | ICD-10-CM

## 2021-01-15 DIAGNOSIS — Z853 Personal history of malignant neoplasm of breast: Secondary | ICD-10-CM

## 2021-01-15 DIAGNOSIS — M2142 Flat foot [pes planus] (acquired), left foot: Secondary | ICD-10-CM

## 2021-01-15 DIAGNOSIS — M2480 Other specific joint derangements of unspecified joint, not elsewhere classified: Secondary | ICD-10-CM

## 2021-01-15 MED ORDER — MELOXICAM 7.5 MG PO TABS
ORAL_TABLET | ORAL | 2 refills | Status: DC
Start: 2021-01-15 — End: 2021-07-09

## 2021-01-15 NOTE — Patient Instructions (Addendum)
CMC joint brace    Hand Exercises Hand exercises can be helpful for almost anyone. These exercises can strengthen the hands, improve flexibility and movement, and increase blood flow to the hands. These results can make work and daily tasks easier. Hand exercises can be especially helpful for people who have joint pain from arthritis or have nerve damage from overuse (carpal tunnel syndrome). These exercises can also help people who have injured a hand. Exercises Most of these hand exercises are gentle stretching and motion exercises. It is usually safe to do them often throughout the day. Warming up your hands before exercise may help to reduce stiffness. You can do this with gentle massage orby placing your hands in warm water for 10-15 minutes. It is normal to feel some stretching, pulling, tightness, or mild discomfort as you begin new exercises. This will gradually improve. Stop an exercise right away if you feel sudden, severe pain or your pain gets worse. Ask your healthcare provider which exercises are best for you. Knuckle bend or "claw" fist Stand or sit with your arm, hand, and all five fingers pointed straight up. Make sure to keep your wrist straight during the exercise. Gently bend your fingers down toward your palm until the tips of your fingers are touching the top of your palm. Keep your big knuckle straight and just bend the small knuckles in your fingers. Hold this position for __________ seconds. Straighten (extend) your fingers back to the starting position. Repeat this exercise 5-10 times with each hand. Full finger fist Stand or sit with your arm, hand, and all five fingers pointed straight up. Make sure to keep your wrist straight during the exercise. Gently bend your fingers into your palm until the tips of your fingers are touching the middle of your palm. Hold this position for __________ seconds. Extend your fingers back to the starting position, stretching every joint  fully. Repeat this exercise 5-10 times with each hand. Straight fist Stand or sit with your arm, hand, and all five fingers pointed straight up. Make sure to keep your wrist straight during the exercise. Gently bend your fingers at the big knuckle, where your fingers meet your hand, and the middle knuckle. Keep the knuckle at the tips of your fingers straight and try to touch the bottom of your palm. Hold this position for __________ seconds. Extend your fingers back to the starting position, stretching every joint fully. Repeat this exercise 5-10 times with each hand. Tabletop Stand or sit with your arm, hand, and all five fingers pointed straight up. Make sure to keep your wrist straight during the exercise. Gently bend your fingers at the big knuckle, where your fingers meet your hand, as far down as you can while keeping the small knuckles in your fingers straight. Think of forming a tabletop with your fingers. Hold this position for __________ seconds. Extend your fingers back to the starting position, stretching every joint fully. Repeat this exercise 5-10 times with each hand. Finger spread Place your hand flat on a table with your palm facing down. Make sure your wrist stays straight as you do this exercise. Spread your fingers and thumb apart from each other as far as you can until you feel a gentle stretch. Hold this position for __________ seconds. Bring your fingers and thumb tight together again. Hold this position for __________ seconds. Repeat this exercise 5-10 times with each hand. Making circles Stand or sit with your arm, hand, and all five fingers pointed straight  up. Make sure to keep your wrist straight during the exercise. Make a circle by touching the tip of your thumb to the tip of your index finger. Hold for __________ seconds. Then open your hand wide. Repeat this motion with your thumb and each finger on your hand. Repeat this exercise 5-10 times with each  hand. Thumb motion Sit with your forearm resting on a table and your wrist straight. Your thumb should be facing up toward the ceiling. Keep your fingers relaxed as you move your thumb. Lift your thumb up as high as you can toward the ceiling. Hold for __________ seconds. Bend your thumb across your palm as far as you can, reaching the tip of your thumb for the small finger (pinkie) side of your palm. Hold for __________ seconds. Repeat this exercise 5-10 times with each hand. Grip strengthening  Hold a stress ball or other soft ball in the middle of your hand. Slowly increase the pressure, squeezing the ball as much as you can without causing pain. Think of bringing the tips of your fingers into the middle of your palm. All of your finger joints should bend when doing this exercise. Hold your squeeze for __________ seconds, then relax. Repeat this exercise 5-10 times with each hand. Contact a health care provider if: Your hand pain or discomfort gets much worse when you do an exercise. Your hand pain or discomfort does not improve within 2 hours after you exercise. If you have any of these problems, stop doing these exercises right away. Do not do them again unless your health care provider says that you can. Get help right away if: You develop sudden, severe hand pain or swelling. If this happens, stop doing these exercises right away. Do not do them again unless your health care provider says that you can. This information is not intended to replace advice given to you by your health care provider. Make sure you discuss any questions you have with your healthcare provider. Document Revised: 10/01/2018 Document Reviewed: 06/11/2018 Elsevier Patient Education  [employer].

## 2021-01-17 ENCOUNTER — Ambulatory Visit
Admission: RE | Admit: 2021-01-17 | Discharge: 2021-01-17 | Disposition: A | Discharge status: Home / Self Care | Payer: Medicare Other | Source: Ambulatory Visit | Attending: Oncology | Admitting: Oncology

## 2021-01-17 ENCOUNTER — Other Ambulatory Visit: Payer: Self-pay

## 2021-01-17 DIAGNOSIS — Z9889 Other specified postprocedural states: Secondary | ICD-10-CM

## 2021-01-22 ENCOUNTER — Telehealth: Payer: Self-pay

## 2021-01-22 NOTE — Progress Notes (Signed)
    Chronic Care Management Pharmacy Assistant   Name: Gloria Frazier  MRN: [id number] DOB: [birth year]   Reason for Encounter: Disease State - General Adherence Call / Schedule CPP Follow up    Recent office visits:  10/12/20 Annye Asa, MD (PCP) - Family Medicine - Hematuria - Labs were ordered. Referral placed to Urology for further evaluation. No follow up indicated.   10/05/20 Grier Mitts, MD - Family Medicine - Acute Bilateral Thoracic Back Pain - Labs were ordered. Cyclobenzaprine 5 mg at bedtime prescribed. No follow up indicated.   Recent consult visits:  01/15/21 Hazel Sams PA-C - Rheumatology - Primary Osteoarthritis - Continue Meloxicam 7.5 mg twice daily. Follow up in 6 months.   11/09/20 Lurline Del, MD - Oncology - Breast cancer - Started Venlafaxine 37.5 mg daily. Follow up in December.    Hospital visits:  None in previous 6 months  Medications: Outpatient Encounter Medications as of 01/22/2021  Medication Sig Note   ALPRAZolam (XANAX) 0.5 MG tablet Take 0.5 mg by mouth at bedtime as needed for anxiety.     diclofenac sodium (VOLTAREN) 1 % GEL Apply 2 g to 4 g to affected joint up to 4 times daily PRN (Patient taking differently: Apply 1 application topically daily.)    gabapentin (NEURONTIN) 300 MG capsule TAKE 1 CAPSULE BY MOUTH EVERYDAY AT BEDTIME (Patient taking differently: as needed.)    meloxicam (MOBIC) 7.5 MG tablet Take 1 tablet by mouth twice daily as needed for pain relief.    methylphenidate (RITALIN) 10 MG tablet Take 1 tab as needed for additional symptom control    methylphenidate 18 MG PO CR tablet Take 1 tablet (18 mg total) by mouth daily.    Multiple Vitamin (MULTIVITAMIN) tablet Take 5 tablets by mouth daily.  01/26/2019: Packet of 5 tablets   tamoxifen (NOLVADEX) 20 MG tablet Take 1 tablet (20 mg total) by mouth daily.    tretinoin (RETIN-A) 0.025 % cream     venlafaxine XR (EFFEXOR-XR) 37.5 MG 24 hr capsule Take 1 capsule (37.5 mg  total) by mouth daily with breakfast.    No facility-administered encounter medications on file as of 01/22/2021.    Have you had any problems recently with your health? Patient denied any current problems with her health at this time.   Have you had any problems with your pharmacy? Patient denied having any problems with her pharmacy at this time.   What issues or side effects are you having with your medications? Patient denied any side effects or problems with current medications.   What would you like me to pass along to [name], CPP for them to help you with?  Patient did not have anything to pass along at this time and stated she is doing well.   What can we do to take care of you better?  Patient had no recommendations at this time and is happy with her current level of care.   Star Rating Drugs: No star rating drugs noted.   Offered to scheduled patient a follow up visit with CPP. Patient declined at this time and stated she will schedule a follow up at a later date as she is currently doing well with no concerns. Advised patient to call me when she is ready to scheduled, she voiced understanding.   Jobe Gibbon, [place] Pharmacist Assistant  [phone]  Time Spent: 40 minutes

## 2021-05-27 IMAGING — MG DIGITAL SCREENING BILATERAL MAMMOGRAM WITH TOMO AND CAD
8 series · 9 of 24 positions shown · non-contrast
Comparison: Previous exam(s).

CLINICAL DATA: Screening.

EXAM:
DIGITAL SCREENING BILATERAL MAMMOGRAM WITH TOMO AND CAD

[L CC synth-2D]
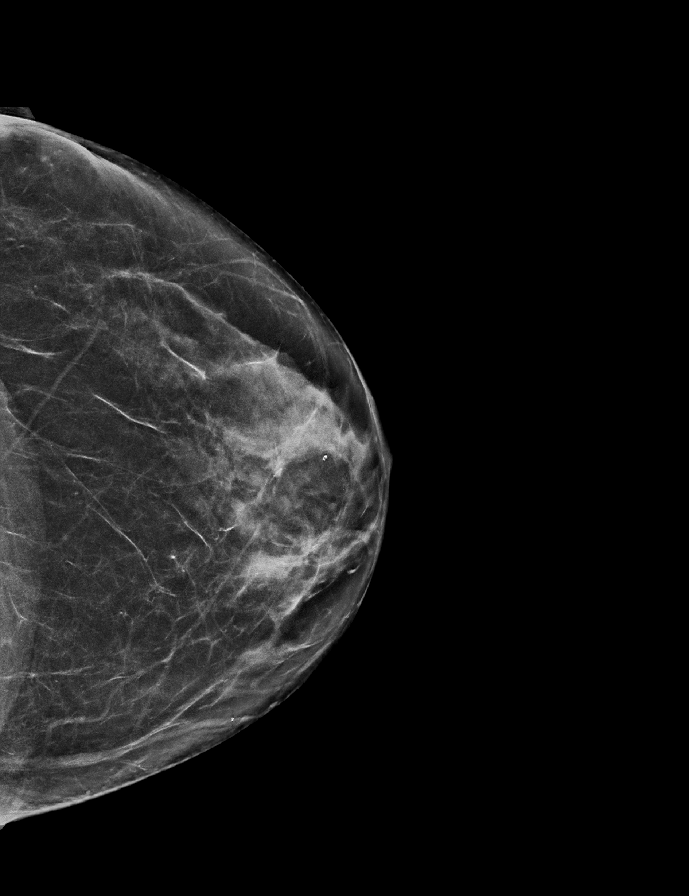

[L MLO synth-2D]
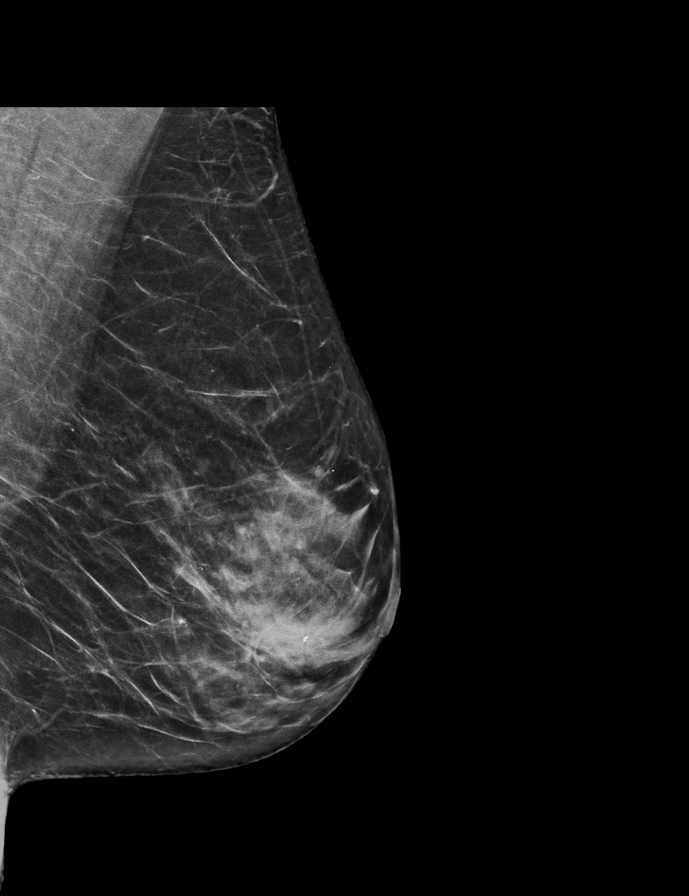

[R MLO synth-2D]
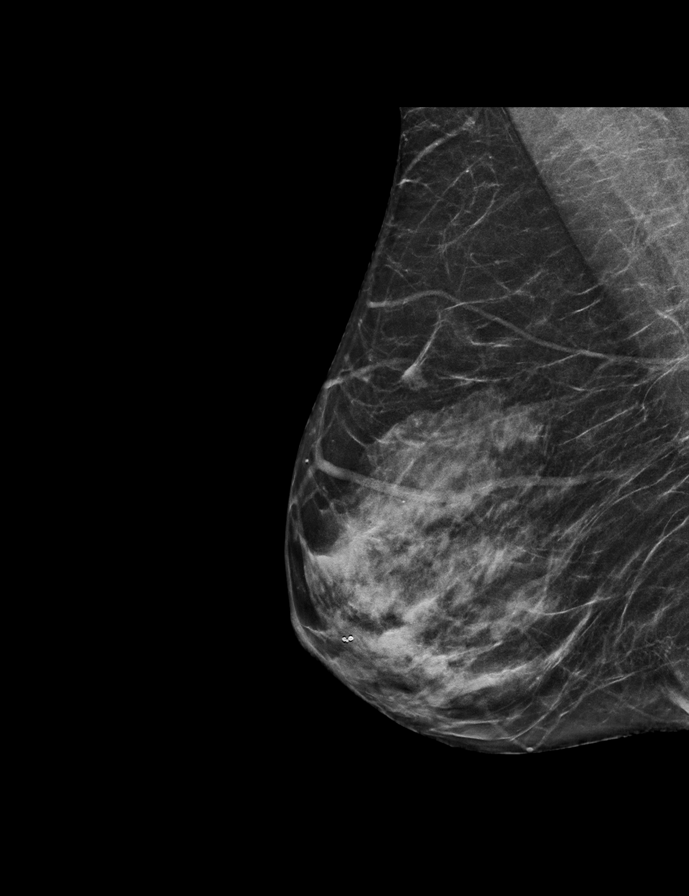

[R CC synth-2D]
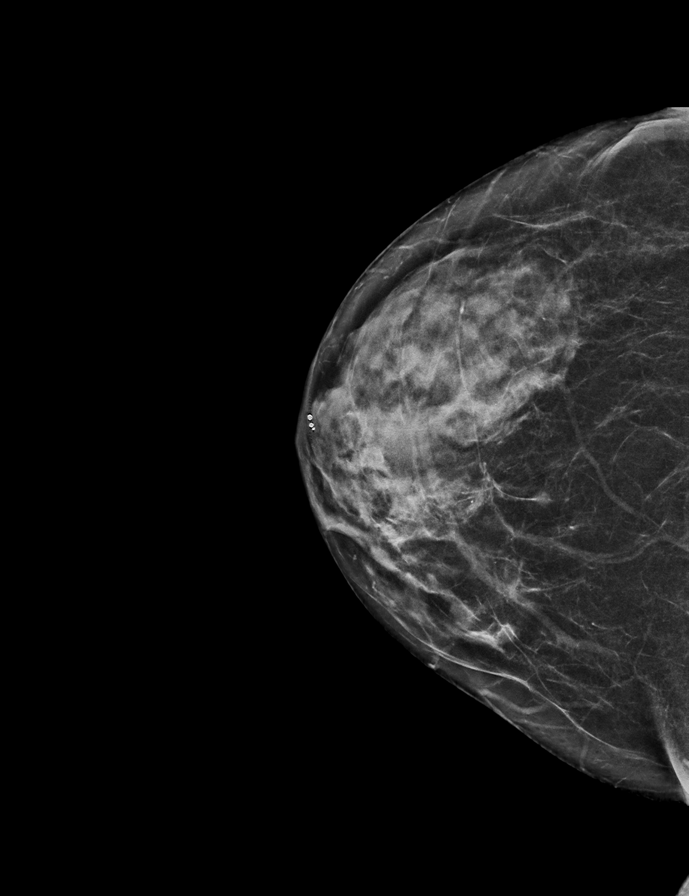

[R CC tomo · 2 of 55 frames shown]
[frame 18/55]
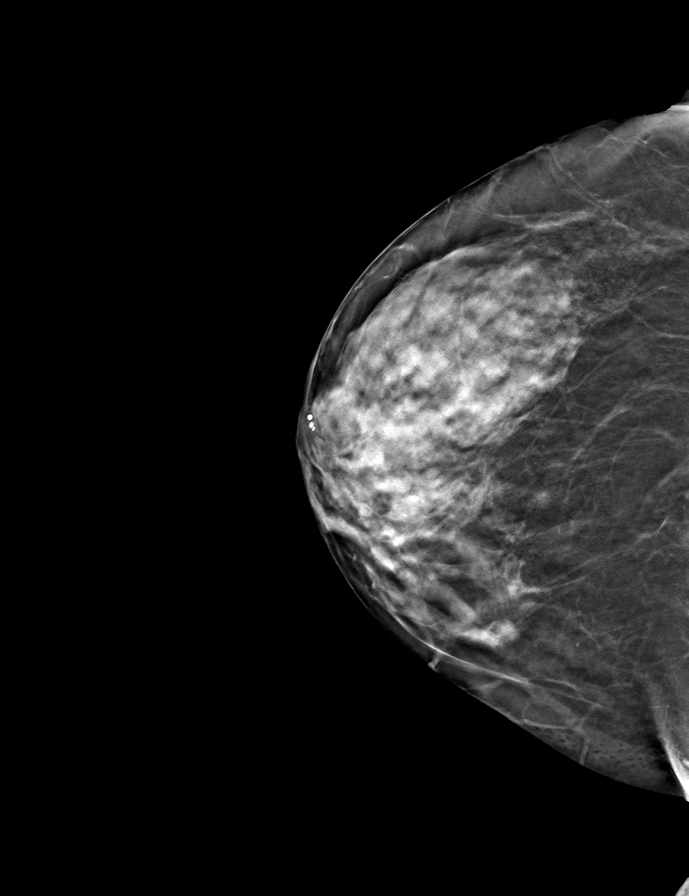
[frame 28/55]
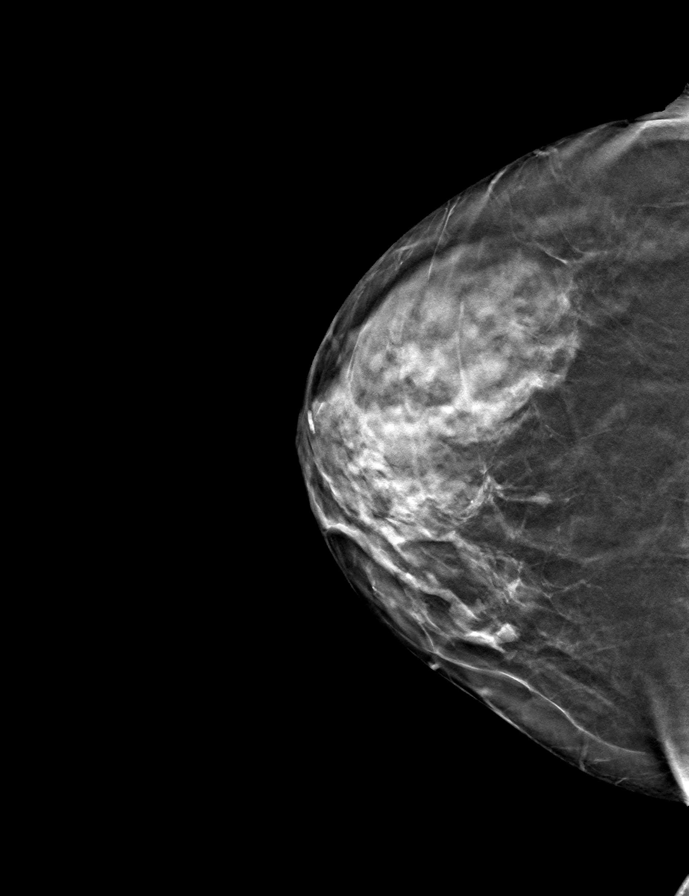

[L CC tomo · tomo slice 31/61.0]
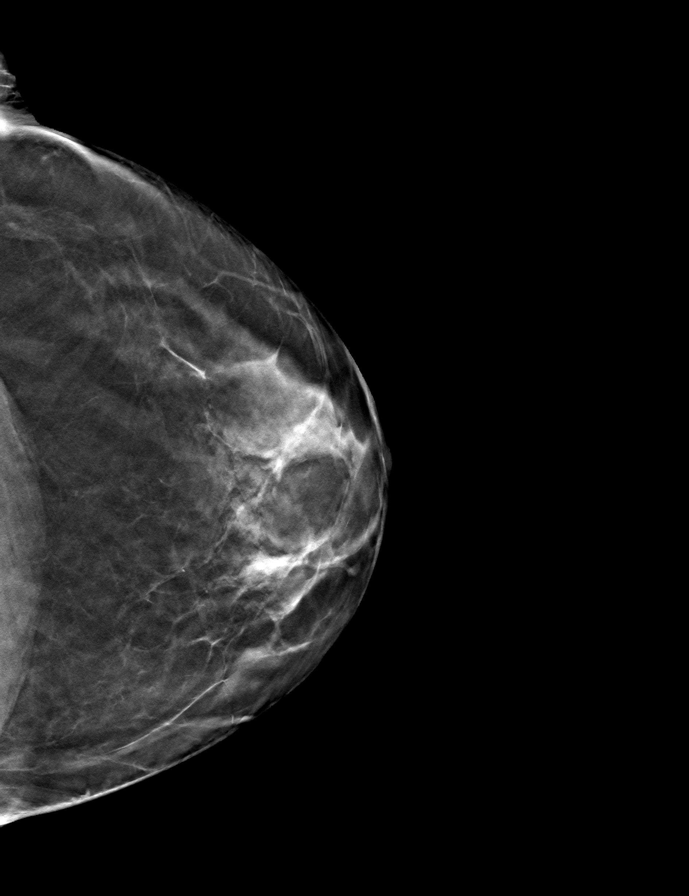

[L MLO tomo · tomo slice 30/59.0]
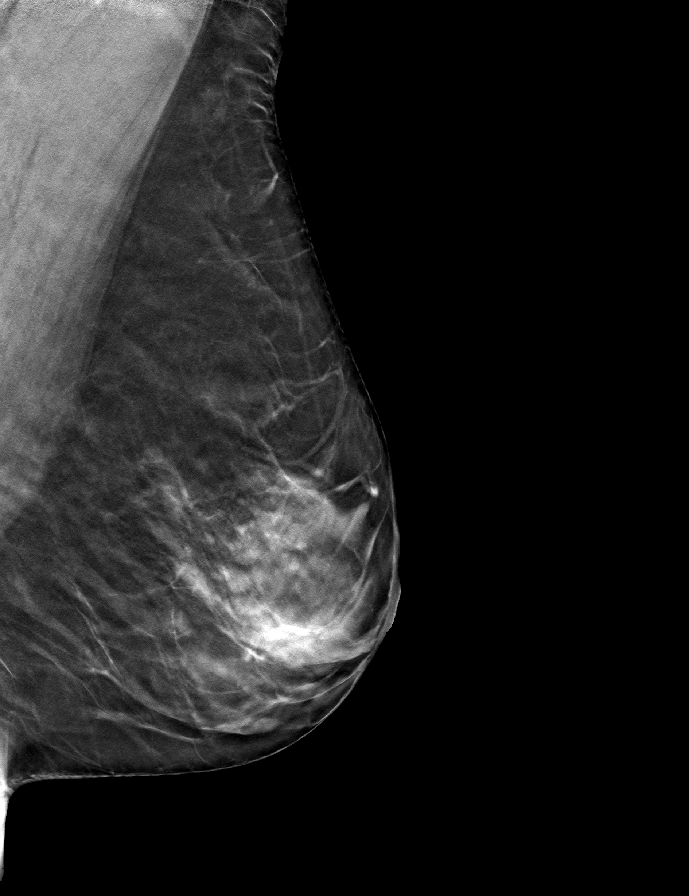

[R MLO tomo · tomo slice 28/55.0]
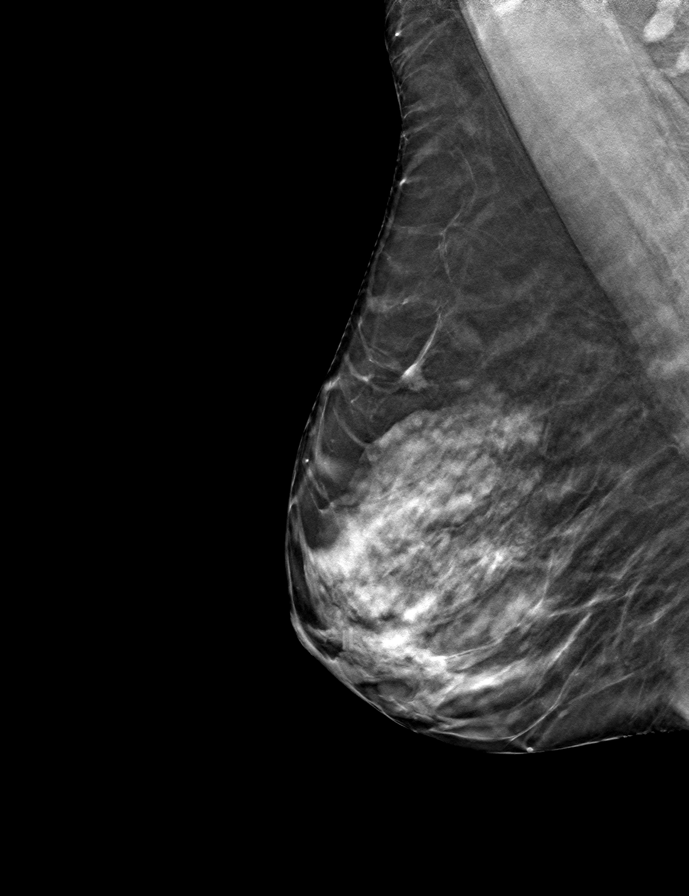

[9 of 24 positions shown; findings below may reference images not displayed]

ACR Breast Density Category c: The breast tissue is heterogeneously
dense, which may obscure small masses.
FINDINGS: In the right breast, a possible mass warrants further evaluation. In
the left breast, no findings suspicious for malignancy. Images were
processed with CAD.
IMPRESSION: Further evaluation is suggested for possible mass in the right
breast.

RECOMMENDATION:
Diagnostic mammogram and possibly ultrasound of the right breast.
(Code:[id number])

The patient will be contacted regarding the findings, and additional
imaging will be scheduled.

BI-RADS CATEGORY  0: Incomplete. Need additional imaging evaluation
and/or prior mammograms for comparison.
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
## 2021-06-11 ENCOUNTER — Other Ambulatory Visit: Payer: Self-pay

## 2021-06-11 ENCOUNTER — Inpatient Hospital Stay: Payer: Medicare Other | Attending: Oncology

## 2021-06-11 ENCOUNTER — Inpatient Hospital Stay (HOSPITAL_BASED_OUTPATIENT_CLINIC_OR_DEPARTMENT_OTHER): Payer: Medicare Other | Admitting: Oncology

## 2021-06-11 VITALS — BP 146/75 | HR 65 | Temp 98.2°F | Resp 17 | Wt 160.3 lb

## 2021-06-11 DIAGNOSIS — Z170 Estrogen receptor positive status [ER+]: Secondary | ICD-10-CM | POA: Diagnosis not present

## 2021-06-11 DIAGNOSIS — C50411 Malignant neoplasm of upper-outer quadrant of right female breast: Secondary | ICD-10-CM

## 2021-06-11 DIAGNOSIS — M160 Bilateral primary osteoarthritis of hip: Secondary | ICD-10-CM

## 2021-06-11 DIAGNOSIS — K219 Gastro-esophageal reflux disease without esophagitis: Secondary | ICD-10-CM

## 2021-06-11 DIAGNOSIS — Z90710 Acquired absence of both cervix and uterus: Secondary | ICD-10-CM | POA: Insufficient documentation

## 2021-06-11 LAB — CBC WITH DIFFERENTIAL/PLATELET
Abs Immature Granulocytes: 0.02 10*3/uL (ref 0.00–0.07)
Basophils Absolute: 0.1 10*3/uL (ref 0.0–0.1)
Basophils Relative: 1 %
Eosinophils Absolute: 0.2 10*3/uL (ref 0.0–0.5)
Eosinophils Relative: 2 %
HCT: 41.1 % (ref 36.0–46.0)
Hemoglobin: 13.8 g/dL (ref 12.0–15.0)
Immature Granulocytes: 0 %
Lymphocytes Relative: 17 %
Lymphs Abs: 1.3 10*3/uL (ref 0.7–4.0)
MCH: 31.3 pg (ref 26.0–34.0)
MCHC: 33.6 g/dL (ref 30.0–36.0)
MCV: 93.2 fL (ref 80.0–100.0)
Monocytes Absolute: 0.6 10*3/uL (ref 0.1–1.0)
Monocytes Relative: 8 %
Neutro Abs: 5.7 10*3/uL (ref 1.7–7.7)
Neutrophils Relative %: 72 %
Platelets: 279 10*3/uL (ref 150–400)
RBC: 4.41 MIL/uL (ref 3.87–5.11)
RDW: 11.8 % (ref 11.5–15.5)
WBC: 7.9 10*3/uL (ref 4.0–10.5)
nRBC: 0 % (ref 0.0–0.2)

## 2021-06-11 LAB — COMPREHENSIVE METABOLIC PANEL
ALT: 31 U/L (ref 0–44)
AST: 34 U/L (ref 15–41)
Albumin: 4 g/dL (ref 3.5–5.0)
Alkaline Phosphatase: 55 U/L (ref 38–126)
Anion gap: 9 (ref 5–15)
BUN: 14 mg/dL (ref 8–23)
CO2: 26 mmol/L (ref 22–32)
Calcium: 8.9 mg/dL (ref 8.9–10.3)
Chloride: 109 mmol/L (ref 98–111)
Creatinine, Ser: 1 mg/dL (ref 0.44–1.00)
GFR, Estimated: >60 mL/min (ref >60)
Glucose, Bld: 96 mg/dL (ref 70–99)
Potassium: 4.3 mmol/L (ref 3.5–5.1)
Sodium: 144 mmol/L (ref 135–145)
Total Bilirubin: 0.5 mg/dL (ref 0.3–1.2)
Total Protein: 7 g/dL (ref 6.5–8.1)

## 2021-06-11 MED ORDER — VENLAFAXINE HCL ER 37.5 MG PO CP24: 37.5000 mg | ORAL_CAPSULE | ORAL | 4 refills | Status: DC

## 2021-06-11 MED ORDER — TAMOXIFEN CITRATE 20 MG PO TABS
20.0000 mg | ORAL_TABLET | Freq: Every day | ORAL | 4 refills | Status: DC
Start: 2021-06-11 — End: 2023-01-20

## 2021-06-14 ENCOUNTER — Encounter: Payer: Self-pay | Admitting: Oncology

## 2021-06-15 ENCOUNTER — Encounter: Payer: Self-pay | Admitting: Family Medicine

## 2021-06-19 ENCOUNTER — Ambulatory Visit (INDEPENDENT_AMBULATORY_CARE_PROVIDER_SITE_OTHER): Payer: Medicare Other | Admitting: Family Medicine

## 2021-06-19 ENCOUNTER — Encounter: Payer: Self-pay | Admitting: Family Medicine

## 2021-06-19 VITALS — BP 126/78 | HR 73 | Temp 98.0°F | Resp 17 | Wt 162.6 lb

## 2021-06-19 DIAGNOSIS — R058 Other specified cough: Secondary | ICD-10-CM | POA: Diagnosis not present

## 2021-06-19 MED ORDER — ALBUTEROL SULFATE HFA 108 (90 BASE) MCG/ACT IN AERS: 2.0000 | INHALATION_SPRAY | Freq: Four times a day (QID) | RESPIRATORY_TRACT | 0 refills | Status: DC | PRN

## 2021-06-19 MED ORDER — GUAIFENESIN-CODEINE 100-10 MG/5ML PO SYRP: 10.0000 mL | ORAL_SOLUTION | Freq: Three times a day (TID) | ORAL | 0 refills | Status: DC | PRN

## 2021-06-19 MED ORDER — PREDNISONE 10 MG PO TABS: ORAL_TABLET | ORAL | 0 refills | Status: DC

## 2021-06-19 NOTE — Progress Notes (Incomplete)
° °  Subjective:    Patient ID: Gloria Frazier, female    DOB: 28-Jun-1955, 65 y.o.   MRN: [id number]  HPI Cough- pt reports sxs started 1 week ago.  + sick contacts- 'she had exactly what I had'.  Early in illness had low grade temp.  Cough has been severe and persistent.  Friday cough started to loosen.  Fever returned on Sunday- Tm 101.  Developed nasal congestion, wheezing.  Reports being 'much better' than what she was but wanted to be checked out prior to traveling.   Review of Systems For ROS see HPI   This visit occurred during the SARS-CoV-2 public health emergency.  Safety protocols were in place, including screening questions prior to the visit, additional usage of staff PPE, and extensive cleaning of exam room while observing appropriate contact time as indicated for disinfecting solutions.      Objective:   Physical Exam        Assessment & Plan:

## 2021-06-19 NOTE — Telephone Encounter (Signed)
Patient scheduled for today at 11:30a

## 2021-06-19 NOTE — Patient Instructions (Signed)
Follow up as needed or as scheduled START the Prednisone as directed- take w/ food Use the Albuterol as needed for coughing jags/wheezing/shortness of breath Use the cough syrup for bed- it may make you drowsy Drink LOTS of fluids REST!  RECOVER! Hang in there!!! Happy New Year!!!

## 2021-06-19 NOTE — Progress Notes (Signed)
° °  Subjective:    Patient ID: Gloria Frazier, female    DOB: 08/24/1955, 65 y.o.   MRN: [id number]  HPI Cough- pt reports sxs started 1 week ago.  + sick contacts- 'she had exactly what I had'.  Early in illness had low grade temp.  Cough has been severe and persistent.  Friday cough started to loosen.  Fever returned on Sunday- Tm 101.  Developed nasal congestion, wheezing.  Reports being 'much better' than what she was but wanted to be checked out prior to traveling.   Review of Systems For ROS see HPI   This visit occurred during the SARS-CoV-2 public health emergency.  Safety protocols were in place, including screening questions prior to the visit, additional usage of staff PPE, and extensive cleaning of exam room while observing appropriate contact time as indicated for disinfecting solutions.      Objective:   Physical Exam Vitals reviewed.  Constitutional:      General: She is not in acute distress.    Appearance: Normal appearance. She is well-developed. She is not ill-appearing.  HENT:     Head: Normocephalic and atraumatic.  Eyes:     Conjunctiva/sclera: Conjunctivae normal.     Pupils: Pupils are equal, round, and reactive to light.  Cardiovascular:     Rate and Rhythm: Normal rate and regular rhythm.     Heart sounds: Normal heart sounds. No murmur heard. Pulmonary:     Effort: Pulmonary effort is normal. No respiratory distress.     Breath sounds: Normal breath sounds. No stridor. No wheezing or rhonchi.     Comments: + hacking cough Musculoskeletal:     Cervical back: Normal range of motion and neck supple.  Lymphadenopathy:     Cervical: No cervical adenopathy.  Neurological:     General: No focal deficit present.     Mental Status: She is alert and oriented to person, place, and time.  Psychiatric:        Mood and Affect: Mood normal.        Behavior: Behavior normal.        Thought Content: Thought content normal.          Assessment & Plan:   Post-viral cough- new.  Pt reports feeling well but cough is very bothersome.  She will also having coughing 'fits' when she is unable to stop coughing or catch her breath.  Start prednisone taper, Albuterol prn, and cough syrup.  Reviewed supportive care and red flags that should prompt return.  Pt expressed understanding and is in agreement w/ plan.
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
## 2021-07-08 ENCOUNTER — Other Ambulatory Visit: Payer: Self-pay | Admitting: Physician Assistant

## 2021-07-09 NOTE — Telephone Encounter (Signed)
Next Visit: 07/17/2021  Last Visit: 01/15/2021  Last Fill: 01/15/2021  DX: Primary osteoarthritis of both hands   Current Dose per office note 01/15/2021: meloxicam 7.5 mg 1 tablet by mouth daily as needed for pain relief  Labs: 06/11/2021 WNL  Okay to refill Meloxicam?

## 2021-07-11 ENCOUNTER — Other Ambulatory Visit: Payer: Self-pay | Admitting: Family Medicine

## 2021-07-17 ENCOUNTER — Ambulatory Visit: Payer: Medicare Other | Admitting: Rheumatology

## 2021-07-17 DIAGNOSIS — M19041 Primary osteoarthritis, right hand: Secondary | ICD-10-CM

## 2021-07-17 DIAGNOSIS — M7711 Lateral epicondylitis, right elbow: Secondary | ICD-10-CM

## 2021-07-17 DIAGNOSIS — M1612 Unilateral primary osteoarthritis, left hip: Secondary | ICD-10-CM

## 2021-07-17 DIAGNOSIS — Z96641 Presence of right artificial hip joint: Secondary | ICD-10-CM

## 2021-07-17 DIAGNOSIS — M2480 Other specific joint derangements of unspecified joint, not elsewhere classified: Secondary | ICD-10-CM

## 2021-07-17 DIAGNOSIS — Z853 Personal history of malignant neoplasm of breast: Secondary | ICD-10-CM

## 2021-07-17 DIAGNOSIS — M21619 Bunion of unspecified foot: Secondary | ICD-10-CM

## 2021-07-17 DIAGNOSIS — M2141 Flat foot [pes planus] (acquired), right foot: Secondary | ICD-10-CM

## 2021-07-23 ENCOUNTER — Ambulatory Visit: Payer: No Typology Code available for payment source

## 2021-07-30 ENCOUNTER — Encounter: Payer: No Typology Code available for payment source | Admitting: Family Medicine

## 2021-08-08 ENCOUNTER — Ambulatory Visit (INDEPENDENT_AMBULATORY_CARE_PROVIDER_SITE_OTHER): Payer: Medicare Other | Admitting: Family Medicine

## 2021-08-08 ENCOUNTER — Encounter: Payer: Self-pay | Admitting: Family Medicine

## 2021-08-08 VITALS — BP 118/70 | HR 60 | Temp 97.8°F | Resp 16 | Ht 65.0 in | Wt 163.8 lb

## 2021-08-08 DIAGNOSIS — E663 Overweight: Secondary | ICD-10-CM

## 2021-08-08 DIAGNOSIS — Z170 Estrogen receptor positive status [ER+]: Secondary | ICD-10-CM

## 2021-08-08 DIAGNOSIS — F902 Attention-deficit hyperactivity disorder, combined type: Secondary | ICD-10-CM | POA: Diagnosis not present

## 2021-08-08 DIAGNOSIS — C50411 Malignant neoplasm of upper-outer quadrant of right female breast: Secondary | ICD-10-CM

## 2021-08-08 LAB — CBC WITH DIFFERENTIAL/PLATELET
Basophils Absolute: 0.1 10*3/uL (ref 0.0–0.1)
Basophils Relative: 0.7 % (ref 0.0–3.0)
Eosinophils Absolute: 0.2 10*3/uL (ref 0.0–0.7)
Eosinophils Relative: 2.6 % (ref 0.0–5.0)
HCT: 40.1 % (ref 36.0–46.0)
Hemoglobin: 13 g/dL (ref 12.0–15.0)
Lymphocytes Relative: 25.7 % (ref 12.0–46.0)
Lymphs Abs: 1.7 10*3/uL (ref 0.7–4.0)
MCHC: 32.6 g/dL (ref 30.0–36.0)
MCV: 92.8 fl (ref 78.0–100.0)
Monocytes Absolute: 0.5 10*3/uL (ref 0.1–1.0)
Monocytes Relative: 8.2 % (ref 3.0–12.0)
Neutro Abs: 4.2 10*3/uL (ref 1.4–7.7)
Neutrophils Relative %: 62.8 % (ref 43.0–77.0)
Platelets: 267 10*3/uL (ref 150.0–400.0)
RBC: 4.32 Mil/uL (ref 3.87–5.11)
RDW: 12.3 % (ref 11.5–15.5)
WBC: 6.7 10*3/uL (ref 4.0–10.5)

## 2021-08-08 NOTE — Progress Notes (Signed)
° °  Subjective:    Patient ID: Gloria Frazier, female    DOB: 10/17/1955, 66 y.o.   MRN: [id number]  HPI Overweight- pt's BMI is 27.26  Weight is stable.  Pt is exercising regularly.  No CP, SOB, HAs, visual changes, abd pain, N/V, edema.  ADHD- chronic problem, on Methylphenidate CR 18mg  daily and Methylphenidate 10mg  PRN.  Only takes meds as needed- prefers to not medicate whenever possible.  Denies palpitations when she does take meds.  Breast cancer- pt is on Tamoxifen.  Has upcoming appt w/ Dr Chryl Heck since Dr Jana Hakim retired.  Review of Systems For ROS see HPI   This visit occurred during the SARS-CoV-2 public health emergency.  Safety protocols were in place, including screening questions prior to the visit, additional usage of staff PPE, and extensive cleaning of exam room while observing appropriate contact time as indicated for disinfecting solutions.      Objective:   Physical Exam Vitals reviewed.  Constitutional:      General: She is not in acute distress.    Appearance: Normal appearance. She is well-developed. She is not ill-appearing.  HENT:     Head: Normocephalic and atraumatic.  Eyes:     Conjunctiva/sclera: Conjunctivae normal.     Pupils: Pupils are equal, round, and reactive to light.  Neck:     Thyroid: No thyromegaly.  Cardiovascular:     Rate and Rhythm: Normal rate and regular rhythm.     Pulses: Normal pulses.     Heart sounds: Normal heart sounds. No murmur heard. Pulmonary:     Effort: Pulmonary effort is normal. No respiratory distress.     Breath sounds: Normal breath sounds.  Abdominal:     General: There is no distension.     Palpations: Abdomen is soft.     Tenderness: There is no abdominal tenderness.  Musculoskeletal:     Cervical back: Normal range of motion and neck supple.     Right lower leg: No edema.     Left lower leg: No edema.  Lymphadenopathy:     Cervical: No cervical adenopathy.  Skin:    General: Skin is warm and dry.   Neurological:     Mental Status: She is alert and oriented to person, place, and time.  Psychiatric:        Behavior: Behavior normal.          Assessment & Plan:

## 2021-08-08 NOTE — Assessment & Plan Note (Signed)
Ongoing issue for pt.  Remains on Tamoxifen.  Oncologist just recently retired and she has upcoming appt to meet her new provider.

## 2021-08-08 NOTE — Patient Instructions (Addendum)
Follow up in 1 year or as needed We'll notify you of your lab results and make any changes if needed Keep up the good work on healthy diet and regular exercise- you look great!!! Call with any questions or concerns Stay Safe!  Stay Healthy! Happy Spring!!! 

## 2021-08-08 NOTE — Assessment & Plan Note (Signed)
Ongoing issue for pt.  Weight is stable.  She is exercising regularly.  Encouraged healthy diet and regular exercise.  Check labs to risk stratify.  Will follow.

## 2021-08-08 NOTE — Assessment & Plan Note (Signed)
Chronic problem.  Only using stimulants as needed bc she prefers to remain unmedicated whenever possible.  When she does take medication she doesn't have any side effects or concerns.  Will continue to prescribe as needed

## 2021-08-09 ENCOUNTER — Ambulatory Visit: Payer: Medicare Other

## 2021-08-09 LAB — BASIC METABOLIC PANEL
BUN: 20 mg/dL (ref 6–23)
CO2: 29 mEq/L (ref 19–32)
Calcium: 9.5 mg/dL (ref 8.4–10.5)
Chloride: 102 mEq/L (ref 96–112)
Creatinine, Ser: 0.84 mg/dL (ref 0.40–1.20)
GFR: 72.09 mL/min (ref >60.00)
Glucose, Bld: 83 mg/dL (ref 70–99)
Potassium: 3.6 mEq/L (ref 3.5–5.1)
Sodium: 137 mEq/L (ref 135–145)

## 2021-08-09 LAB — LIPID PANEL
Cholesterol: 210 mg/dL — ABNORMAL HIGH (ref 0–200)
HDL: 79.2 mg/dL (ref >39.00)
LDL Cholesterol: 99 mg/dL (ref 0–99)
NonHDL: 131.27
Total CHOL/HDL Ratio: 3
Triglycerides: 160 mg/dL — ABNORMAL HIGH (ref 0.0–149.0)
VLDL: 32 mg/dL (ref 0.0–40.0)

## 2021-08-09 LAB — HEPATIC FUNCTION PANEL
ALT: 22 U/L (ref 0–35)
AST: 27 U/L (ref 0–37)
Albumin: 4.3 g/dL (ref 3.5–5.2)
Alkaline Phosphatase: 53 U/L (ref 39–117)
Bilirubin, Direct: 0.1 mg/dL (ref 0.0–0.3)
Total Bilirubin: 0.3 mg/dL (ref 0.2–1.2)
Total Protein: 7.6 g/dL (ref 6.0–8.3)

## 2021-10-11 ENCOUNTER — Ambulatory Visit (INDEPENDENT_AMBULATORY_CARE_PROVIDER_SITE_OTHER): Payer: Medicare Other | Admitting: *Deleted

## 2021-10-11 ENCOUNTER — Other Ambulatory Visit: Payer: Self-pay | Admitting: Rheumatology

## 2021-10-11 DIAGNOSIS — Z0000 Encounter for general adult medical examination without abnormal findings: Secondary | ICD-10-CM | POA: Diagnosis not present

## 2021-10-11 DIAGNOSIS — Z1231 Encounter for screening mammogram for malignant neoplasm of breast: Secondary | ICD-10-CM

## 2021-10-11 DIAGNOSIS — Z780 Asymptomatic menopausal state: Secondary | ICD-10-CM | POA: Diagnosis not present

## 2021-10-11 NOTE — Telephone Encounter (Signed)
Please schedule patient for a follow up visit. Patient was due January 2023. Thanks!  ?

## 2021-10-11 NOTE — Patient Instructions (Signed)
Gloria Frazier , ?Thank you for taking time to come for your Medicare Wellness Visit. I appreciate your ongoing commitment to your health goals. Please review the following plan we discussed and let me know if I can assist you in the future.  ? ?Screening recommendations/referrals: ?Colonoscopy: up to date ?Mammogram: Education provided ?Bone Density: Education provided ?Recommended yearly ophthalmology/optometry visit for glaucoma screening and checkup ?Recommended yearly dental visit for hygiene and checkup ? ?Vaccinations: ?Influenza vaccine: declined ?Pneumococcal vaccine: Education provided ?Tdap vaccine: Education provided ?Shingles vaccine: up to date   ? ?Advanced directives: on file ? ?Conditions/risks identified:  ? ?Next appointment: 08-09-2021 @ 8:00  [place] ? ? ?Preventive Care 67 Years and Older, Female ?Preventive care refers to lifestyle choices and visits with your health care provider that can promote health and wellness. ?What does preventive care include? ?A yearly physical exam. This is also called an annual well check. ?Dental exams once or twice a year. ?Routine eye exams. Ask your health care provider how often you should have your eyes checked. ?Personal lifestyle choices, including: ?Daily care of your teeth and gums. ?Regular physical activity. ?Eating a healthy diet. ?Avoiding tobacco and drug use. ?Limiting alcohol use. ?Practicing safe sex. ?Taking low-dose aspirin every day. ?Taking vitamin and mineral supplements as recommended by your health care provider. ?What happens during an annual well check? ?The services and screenings done by your health care provider during your annual well check will depend on your age, overall health, lifestyle risk factors, and family history of disease. ?Counseling  ?Your health care provider may ask you questions about your: ?Alcohol use. ?Tobacco use. ?Drug use. ?Emotional well-being. ?Home and relationship well-being. ?Sexual activity. ?Eating  habits. ?History of falls. ?Memory and ability to understand (cognition). ?Work and work Statistician. ?Reproductive health. ?Screening  ?You may have the following tests or measurements: ?Height, weight, and BMI. ?Blood pressure. ?Lipid and cholesterol levels. These may be checked every 5 years, or more frequently if you are over 73 years old. ?Skin check. ?Lung cancer screening. You may have this screening every year starting at age 12 if you have a 30-pack-year history of smoking and currently smoke or have quit within the past 15 years. ?Fecal occult blood test (FOBT) of the stool. You may have this test every year starting at age 52. ?Flexible sigmoidoscopy or colonoscopy. You may have a sigmoidoscopy every 5 years or a colonoscopy every 10 years starting at age 69. ?Hepatitis C blood test. ?Hepatitis B blood test. ?Sexually transmitted disease (STD) testing. ?Diabetes screening. This is done by checking your blood sugar (glucose) after you have not eaten for a while (fasting). You may have this done every 1-3 years. ?Bone density scan. This is done to screen for osteoporosis. You may have this done starting at age 67. ?Mammogram. This may be done every 1-2 years. Talk to your health care provider about how often you should have regular mammograms. ?Talk with your health care provider about your test results, treatment options, and if necessary, the need for more tests. ?Vaccines  ?Your health care provider may recommend certain vaccines, such as: ?Influenza vaccine. This is recommended every year. ?Tetanus, diphtheria, and acellular pertussis (Tdap, Td) vaccine. You may need a Td booster every 10 years. ?Zoster vaccine. You may need this after age 21. ?Pneumococcal 13-valent conjugate (PCV13) vaccine. One dose is recommended after age 37. ?Pneumococcal polysaccharide (PPSV23) vaccine. One dose is recommended after age 43. ?Talk to your health care provider about which screenings and  vaccines you need and how  often you need them. ?This information is not intended to replace advice given to you by your health care provider. Make sure you discuss any questions you have with your health care provider. ?Document Released: 07/07/2015 Document Revised: 02/28/2016 Document Reviewed: 04/11/2015 ?Elsevier Interactive Patient Education ? 2017 [place]. ? ?Fall Prevention in the Home ?Falls can cause injuries. They can happen to people of all ages. There are many things you can do to make your home safe and to help prevent falls. ?What can I do on the outside of my home? ?Regularly fix the edges of walkways and driveways and fix any cracks. ?Remove anything that might make you trip as you walk through a door, such as a raised step or threshold. ?Trim any bushes or trees on the path to your home. ?Use bright outdoor lighting. ?Clear any walking paths of anything that might make someone trip, such as rocks or tools. ?Regularly check to see if handrails are loose or broken. Make sure that both sides of any steps have handrails. ?Any raised decks and porches should have guardrails on the edges. ?Have any leaves, snow, or ice cleared regularly. ?Use sand or salt on walking paths during winter. ?Clean up any spills in your garage right away. This includes oil or grease spills. ?What can I do in the bathroom? ?Use night lights. ?Install grab bars by the toilet and in the tub and shower. Do not use towel bars as grab bars. ?Use non-skid mats or decals in the tub or shower. ?If you need to sit down in the shower, use a plastic, non-slip stool. ?Keep the floor dry. Clean up any water that spills on the floor as soon as it happens. ?Remove soap buildup in the tub or shower regularly. ?Attach bath mats securely with double-sided non-slip rug tape. ?Do not have throw rugs and other things on the floor that can make you trip. ?What can I do in the bedroom? ?Use night lights. ?Make sure that you have a light by your bed that is easy to  reach. ?Do not use any sheets or blankets that are too big for your bed. They should not hang down onto the floor. ?Have a firm chair that has side arms. You can use this for support while you get dressed. ?Do not have throw rugs and other things on the floor that can make you trip. ?What can I do in the kitchen? ?Clean up any spills right away. ?Avoid walking on wet floors. ?Keep items that you use a lot in easy-to-reach places. ?If you need to reach something above you, use a strong step stool that has a grab bar. ?Keep electrical cords out of the way. ?Do not use floor polish or wax that makes floors slippery. If you must use wax, use non-skid floor wax. ?Do not have throw rugs and other things on the floor that can make you trip. ?What can I do with my stairs? ?Do not leave any items on the stairs. ?Make sure that there are handrails on both sides of the stairs and use them. Fix handrails that are broken or loose. Make sure that handrails are as long as the stairways. ?Check any carpeting to make sure that it is firmly attached to the stairs. Fix any carpet that is loose or worn. ?Avoid having throw rugs at the top or bottom of the stairs. If you do have throw rugs, attach them to the floor with carpet tape. ?Make  sure that you have a light switch at the top of the stairs and the bottom of the stairs. If you do not have them, ask someone to add them for you. ?What else can I do to help prevent falls? ?Wear shoes that: ?Do not have high heels. ?Have rubber bottoms. ?Are comfortable and fit you well. ?Are closed at the toe. Do not wear sandals. ?If you use a stepladder: ?Make sure that it is fully opened. Do not climb a closed stepladder. ?Make sure that both sides of the stepladder are locked into place. ?Ask someone to hold it for you, if possible. ?Clearly mark and make sure that you can see: ?Any grab bars or handrails. ?First and last steps. ?Where the edge of each step is. ?Use tools that help you move  around (mobility aids) if they are needed. These include: ?Canes. ?Walkers. ?Scooters. ?Crutches. ?Turn on the lights when you go into a dark area. Replace any light bulbs as soon as they burn out. ?Set up your furniture so

## 2021-10-11 NOTE — Telephone Encounter (Signed)
LMOM for patient to call and schedule follow-up appointment.   °
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
## 2021-10-11 NOTE — Telephone Encounter (Signed)
Next Visit: Due January 2023. (Patient no showed appointment.) Message sent to the front to schedule patient.  ? ?Last Visit: 01/15/2021 ? ?Last Fill: 07/09/2021 ? ?DX: Primary osteoarthritis of both hands  ? ?Current Dose per office note 01/15/2021: meloxicam 7.5 mg 1 tablet by mouth daily as needed for pain relief ? ?Labs: 08/08/2021  ? ?Okay to refill Meloxicam?  ?

## 2021-11-26 ENCOUNTER — Other Ambulatory Visit: Payer: Self-pay | Admitting: Family Medicine

## 2021-11-26 DIAGNOSIS — Z853 Personal history of malignant neoplasm of breast: Secondary | ICD-10-CM

## 2022-01-31 ENCOUNTER — Ambulatory Visit
Admission: EM | Admit: 2022-01-31 | Discharge: 2022-01-31 | Disposition: A | Discharge status: Home / Self Care | Payer: Medicare Other | Attending: Family Medicine | Admitting: Family Medicine

## 2022-01-31 ENCOUNTER — Encounter: Payer: Self-pay | Admitting: Emergency Medicine

## 2022-01-31 DIAGNOSIS — L239 Allergic contact dermatitis, unspecified cause: Secondary | ICD-10-CM

## 2022-01-31 MED ORDER — TRIAMCINOLONE ACETONIDE 40 MG/ML IJ SUSP
40.0000 mg | Freq: Once | INTRAMUSCULAR | Status: AC
  Administered 2022-01-31: 40 mg via INTRAMUSCULAR

## 2022-01-31 MED ORDER — PREDNISONE 20 MG PO TABS
40.0000 mg | ORAL_TABLET | Freq: Every day | ORAL | 0 refills | Status: AC
Start: 2022-01-31 — End: 2022-02-05

## 2022-01-31 MED ORDER — VALACYCLOVIR HCL 1 G PO TABS: 1000.0000 mg | ORAL_TABLET | Freq: Three times a day (TID) | ORAL | 0 refills | Status: AC

## 2022-01-31 NOTE — ED Triage Notes (Signed)
Pt here with blistering rash to mostly here right side, but has some spots on the left as well since last night. Rash is very itchy. Pt denies being outside in the yard or in grass and puppy is new so he has not been wandering outside either.

## 2022-01-31 NOTE — Discharge Instructions (Addendum)
You have been given a shot of triamcinolone 40 mg today  Take prednisone 20 mg--2 daily for 5 days  The prescription is printed for Valtrex--if you start seeing a rash that is more typical of shingles: Blisters that look like chickenpox and one-sided and in a patch, then fill the Valtrex and take it 1 g orally 3 times daily for 1 week
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
## 2022-02-04 ENCOUNTER — Ambulatory Visit: Payer: Medicare Other | Admitting: Family Medicine

## 2022-02-08 ENCOUNTER — Ambulatory Visit
Admission: RE | Admit: 2022-02-08 | Discharge: 2022-02-08 | Disposition: A | Discharge status: Home / Self Care | Payer: Medicare Other | Source: Ambulatory Visit | Attending: Family Medicine | Admitting: Family Medicine

## 2022-02-11 ENCOUNTER — Other Ambulatory Visit: Payer: Self-pay | Admitting: Rheumatology

## 2022-04-30 ENCOUNTER — Ambulatory Visit (INDEPENDENT_AMBULATORY_CARE_PROVIDER_SITE_OTHER): Payer: Medicare Other | Admitting: Family Medicine

## 2022-04-30 DIAGNOSIS — Z23 Encounter for immunization: Secondary | ICD-10-CM | POA: Diagnosis not present

## 2022-04-30 NOTE — Progress Notes (Signed)
PT came in for her high dose flu vaccine  gave vaccine in left deltoid and pt tolerated vaccine well

## 2022-05-23 ENCOUNTER — Other Ambulatory Visit: Payer: Self-pay | Admitting: Family Medicine

## 2022-06-11 ENCOUNTER — Encounter: Payer: Self-pay | Admitting: Hematology and Oncology

## 2022-06-11 ENCOUNTER — Inpatient Hospital Stay: Payer: Medicare Other | Attending: Hematology and Oncology

## 2022-06-11 ENCOUNTER — Other Ambulatory Visit: Payer: Self-pay

## 2022-06-11 ENCOUNTER — Inpatient Hospital Stay (HOSPITAL_BASED_OUTPATIENT_CLINIC_OR_DEPARTMENT_OTHER): Payer: Medicare Other | Admitting: Hematology and Oncology

## 2022-06-11 VITALS — BP 123/95 | HR 76 | Temp 98.1°F | Resp 17 | Wt 159.1 lb

## 2022-06-11 DIAGNOSIS — Z90710 Acquired absence of both cervix and uterus: Secondary | ICD-10-CM | POA: Diagnosis not present

## 2022-06-11 DIAGNOSIS — C50411 Malignant neoplasm of upper-outer quadrant of right female breast: Secondary | ICD-10-CM | POA: Insufficient documentation

## 2022-06-11 DIAGNOSIS — M160 Bilateral primary osteoarthritis of hip: Secondary | ICD-10-CM

## 2022-06-11 DIAGNOSIS — Z923 Personal history of irradiation: Secondary | ICD-10-CM | POA: Insufficient documentation

## 2022-06-11 DIAGNOSIS — Z170 Estrogen receptor positive status [ER+]: Secondary | ICD-10-CM | POA: Diagnosis not present

## 2022-06-11 DIAGNOSIS — K219 Gastro-esophageal reflux disease without esophagitis: Secondary | ICD-10-CM

## 2022-06-11 DIAGNOSIS — Z79810 Long term (current) use of selective estrogen receptor modulators (SERMs): Secondary | ICD-10-CM | POA: Diagnosis not present

## 2022-06-11 DIAGNOSIS — Z79899 Other long term (current) drug therapy: Secondary | ICD-10-CM | POA: Insufficient documentation

## 2022-06-11 LAB — CBC WITH DIFFERENTIAL/PLATELET
Abs Immature Granulocytes: 0.01 10*3/uL (ref 0.00–0.07)
Basophils Absolute: 0 10*3/uL (ref 0.0–0.1)
Basophils Relative: 1 %
Eosinophils Absolute: 0.3 10*3/uL (ref 0.0–0.5)
Eosinophils Relative: 5 %
HCT: 40.7 % (ref 36.0–46.0)
Hemoglobin: 13.4 g/dL (ref 12.0–15.0)
Immature Granulocytes: 0 %
Lymphocytes Relative: 19 %
Lymphs Abs: 1.2 10*3/uL (ref 0.7–4.0)
MCH: 30.6 pg (ref 26.0–34.0)
MCHC: 32.9 g/dL (ref 30.0–36.0)
MCV: 92.9 fL (ref 80.0–100.0)
Monocytes Absolute: 0.6 10*3/uL (ref 0.1–1.0)
Monocytes Relative: 9 %
Neutro Abs: 4.1 10*3/uL (ref 1.7–7.7)
Neutrophils Relative %: 66 %
Platelets: 258 10*3/uL (ref 150–400)
RBC: 4.38 MIL/uL (ref 3.87–5.11)
RDW: 11.4 % — ABNORMAL LOW (ref 11.5–15.5)
WBC: 6.2 10*3/uL (ref 4.0–10.5)
nRBC: 0 % (ref 0.0–0.2)

## 2022-06-11 LAB — COMPREHENSIVE METABOLIC PANEL
ALT: 38 U/L (ref 0–44)
AST: 40 U/L (ref 15–41)
Albumin: 3.7 g/dL (ref 3.5–5.0)
Alkaline Phosphatase: 42 U/L (ref 38–126)
Anion gap: 8 (ref 5–15)
BUN: 21 mg/dL (ref 8–23)
CO2: 24 mmol/L (ref 22–32)
Calcium: 8.9 mg/dL (ref 8.9–10.3)
Chloride: 110 mmol/L (ref 98–111)
Creatinine, Ser: 0.76 mg/dL (ref 0.44–1.00)
GFR, Estimated: >60 mL/min (ref >60)
Glucose, Bld: 102 mg/dL — ABNORMAL HIGH (ref 70–99)
Potassium: 4.2 mmol/L (ref 3.5–5.1)
Sodium: 142 mmol/L (ref 135–145)
Total Bilirubin: 0.4 mg/dL (ref 0.3–1.2)
Total Protein: 6.6 g/dL (ref 6.5–8.1)

## 2022-06-11 MED ORDER — VENLAFAXINE HCL ER 75 MG PO CP24: 75.0000 mg | ORAL_CAPSULE | ORAL | 3 refills | Status: DC
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
## 2022-07-15 ENCOUNTER — Telehealth: Payer: Self-pay | Admitting: *Deleted

## 2022-07-15 ENCOUNTER — Other Ambulatory Visit: Payer: Self-pay | Admitting: *Deleted

## 2022-07-15 MED ORDER — VENLAFAXINE HCL ER 75 MG PO CP24: 75.0000 mg | ORAL_CAPSULE | Freq: Every day | ORAL | 3 refills | Status: DC

## 2022-07-15 NOTE — Telephone Encounter (Signed)
Pt called stating that Effexor 75 mg daily is working better for her hot flashes.   The previous script was for  75 mg  every other day - not working well for pt. Pt requested refill of Effexor  75 mg daily. Uses  [facility] pharmacy in [place] on [address].

## 2022-08-09 ENCOUNTER — Encounter: Payer: Self-pay | Admitting: Family Medicine

## 2022-08-09 ENCOUNTER — Telehealth: Payer: Self-pay

## 2022-08-09 ENCOUNTER — Ambulatory Visit (INDEPENDENT_AMBULATORY_CARE_PROVIDER_SITE_OTHER): Payer: Medicare Other | Admitting: Family Medicine

## 2022-08-09 ENCOUNTER — Other Ambulatory Visit: Payer: Self-pay

## 2022-08-09 VITALS — BP 128/76 | HR 69 | Temp 98.2°F | Resp 17 | Ht 65.0 in | Wt 160.5 lb

## 2022-08-09 DIAGNOSIS — R748 Abnormal levels of other serum enzymes: Secondary | ICD-10-CM

## 2022-08-09 DIAGNOSIS — Z23 Encounter for immunization: Secondary | ICD-10-CM | POA: Diagnosis not present

## 2022-08-09 DIAGNOSIS — E663 Overweight: Secondary | ICD-10-CM

## 2022-08-09 DIAGNOSIS — F902 Attention-deficit hyperactivity disorder, combined type: Secondary | ICD-10-CM

## 2022-08-09 LAB — BASIC METABOLIC PANEL
BUN: 19 mg/dL (ref 6–23)
CO2: 30 mEq/L (ref 19–32)
Calcium: 9.5 mg/dL (ref 8.4–10.5)
Chloride: 102 mEq/L (ref 96–112)
Creatinine, Ser: 0.87 mg/dL (ref 0.40–1.20)
GFR: 68.63 mL/min (ref >60.00)
Glucose, Bld: 88 mg/dL (ref 70–99)
Potassium: 4.7 mEq/L (ref 3.5–5.1)
Sodium: 140 mEq/L (ref 135–145)

## 2022-08-09 LAB — CBC WITH DIFFERENTIAL/PLATELET
Basophils Absolute: 0.1 10*3/uL (ref 0.0–0.1)
Basophils Relative: 0.9 % (ref 0.0–3.0)
Eosinophils Absolute: 0.5 10*3/uL (ref 0.0–0.7)
Eosinophils Relative: 7.9 % — ABNORMAL HIGH (ref 0.0–5.0)
HCT: 42.4 % (ref 36.0–46.0)
Hemoglobin: 14 g/dL (ref 12.0–15.0)
Lymphocytes Relative: 22.5 % (ref 12.0–46.0)
Lymphs Abs: 1.5 10*3/uL (ref 0.7–4.0)
MCHC: 32.9 g/dL (ref 30.0–36.0)
MCV: 92.1 fl (ref 78.0–100.0)
Monocytes Absolute: 0.6 10*3/uL (ref 0.1–1.0)
Monocytes Relative: 8.8 % (ref 3.0–12.0)
Neutro Abs: 4.1 10*3/uL (ref 1.4–7.7)
Neutrophils Relative %: 59.9 % (ref 43.0–77.0)
Platelets: 296 10*3/uL (ref 150.0–400.0)
RBC: 4.61 Mil/uL (ref 3.87–5.11)
RDW: 13 % (ref 11.5–15.5)
WBC: 6.9 10*3/uL (ref 4.0–10.5)

## 2022-08-09 LAB — LIPID PANEL
Cholesterol: 208 mg/dL — ABNORMAL HIGH (ref 0–200)
HDL: 85.5 mg/dL (ref >39.00)
LDL Cholesterol: 110 mg/dL — ABNORMAL HIGH (ref 0–99)
NonHDL: 122.83
Total CHOL/HDL Ratio: 2
Triglycerides: 63 mg/dL (ref 0.0–149.0)
VLDL: 12.6 mg/dL (ref 0.0–40.0)

## 2022-08-09 LAB — HEPATIC FUNCTION PANEL
ALT: 77 U/L — ABNORMAL HIGH (ref 0–35)
AST: 121 U/L — ABNORMAL HIGH (ref 0–37)
Albumin: 4.2 g/dL (ref 3.5–5.2)
Alkaline Phosphatase: 56 U/L (ref 39–117)
Bilirubin, Direct: 0.1 mg/dL (ref 0.0–0.3)
Total Bilirubin: 0.6 mg/dL (ref 0.2–1.2)
Total Protein: 7 g/dL (ref 6.0–8.3)

## 2022-08-09 MED ORDER — METHYLPHENIDATE HCL ER (OSM) 18 MG PO TBCR: 18.0000 mg | EXTENDED_RELEASE_TABLET | Freq: Every day | ORAL | 0 refills | Status: DC

## 2022-08-09 NOTE — Assessment & Plan Note (Signed)
Ongoing issue.  Pt is doing well on Methylphenidate as needed.  Needs refill on extended release 32m dose.  Prescription sent

## 2022-08-09 NOTE — Telephone Encounter (Signed)
Informed pt of lab results . Repeat LFT is scheduled for 08/21/22 and order is in

## 2022-08-09 NOTE — Progress Notes (Signed)
   Subjective:    Patient ID: Gloria Frazier, female    DOB: 06/30/1955, 67 y.o.   MRN: [id number]  HPI Overweight- exercising regularly.  Goes to the gym for resistance training, does yoga classes at the [facility], walks regularly.  Pt reports feeling good.  Pt reports that exercise has been a great stress reliever.  No CP, SOB, HA's, visual changes, abd pain, N/V.  ADHD- pt is on Methylphenidate as needed.  When she does need it, she feels dose is appropriate.  Admits to difficulty w/ motivation and follow through if not taking meds.   Review of Systems For ROS see HPI     Objective:   Physical Exam General Appearance:    Alert, cooperative, no distress, appears stated age  Head:    Normocephalic, without obvious abnormality, atraumatic  Eyes:    PERRL, conjunctiva/corneas clear, EOM's intact both eyes  Ears:    Normal TM's and external ear canals, both ears  Nose:   Nares normal, septum midline, mucosa normal, no drainage    or sinus tenderness  Throat:   Lips, mucosa, and tongue normal; teeth and gums normal  Neck:   Supple, symmetrical, trachea midline, no adenopathy;    Thyroid: no enlargement/tenderness/nodules  Back:     Symmetric, no curvature, ROM normal, no CVA tenderness  Lungs:     Clear to auscultation bilaterally, respirations unlabored  Chest Wall:    No tenderness or deformity   Heart:    Regular rate and rhythm, S1 and S2 normal, no murmur, rub   or gallop  Breast Exam:    Deferred to mammo  Abdomen:     Soft, non-tender, bowel sounds active all four quadrants,    no masses, no organomegaly  Genitalia:    Deferred  Rectal:    Extremities:   Extremities normal, atraumatic, no cyanosis or edema  Pulses:   2+ and symmetric all extremities  Skin:   Skin color, texture, turgor normal, no rashes or lesions  Lymph nodes:   Cervical, supraclavicular, and axillary nodes normal  Neurologic:   CNII-XII intact, normal strength, sensation and reflexes    throughout           Assessment & Plan:

## 2022-08-09 NOTE — Assessment & Plan Note (Signed)
Pt is exercising regularly and looks great!  Applauded her efforts.  Check labs to risk stratify.  Will follow.

## 2022-08-09 NOTE — Patient Instructions (Signed)
Follow up in 1 year or as needed We'll notify you of your lab results and make any changes if needed Keep up the good work on healthy diet and regular exercise- you look great!!! Call with any questions or concerns Stay Safe!  Stay Healthy! Happy Spring!!!

## 2022-08-09 NOTE — Telephone Encounter (Signed)
-----   Message from Midge Minium, MD sent at 08/09/2022  3:53 PM EST ----- Labs look great w/ exception of elevated AST and ALT (liver enzymes).  Please hold tylenol and alcohol x2 weeks and we'll repeat your liver functions at a lab only visit (dx elevated liver enzymes)

## 2022-08-20 ENCOUNTER — Ambulatory Visit: Payer: Medicare Other | Attending: Physician Assistant | Admitting: Physician Assistant

## 2022-08-20 ENCOUNTER — Ambulatory Visit (INDEPENDENT_AMBULATORY_CARE_PROVIDER_SITE_OTHER): Payer: Medicare Other

## 2022-08-20 ENCOUNTER — Encounter: Payer: Self-pay | Admitting: Physician Assistant

## 2022-08-20 ENCOUNTER — Ambulatory Visit: Payer: Medicare Other

## 2022-08-20 VITALS — BP 110/70 | HR 78 | Resp 16 | Ht 65.5 in | Wt 160.0 lb

## 2022-08-20 DIAGNOSIS — M79642 Pain in left hand: Secondary | ICD-10-CM

## 2022-08-20 DIAGNOSIS — M2141 Flat foot [pes planus] (acquired), right foot: Secondary | ICD-10-CM | POA: Diagnosis present

## 2022-08-20 DIAGNOSIS — Z96641 Presence of right artificial hip joint: Secondary | ICD-10-CM | POA: Insufficient documentation

## 2022-08-20 DIAGNOSIS — M7711 Lateral epicondylitis, right elbow: Secondary | ICD-10-CM

## 2022-08-20 DIAGNOSIS — Z5181 Encounter for therapeutic drug level monitoring: Secondary | ICD-10-CM | POA: Diagnosis present

## 2022-08-20 DIAGNOSIS — M79641 Pain in right hand: Secondary | ICD-10-CM | POA: Insufficient documentation

## 2022-08-20 DIAGNOSIS — Z853 Personal history of malignant neoplasm of breast: Secondary | ICD-10-CM | POA: Diagnosis present

## 2022-08-20 DIAGNOSIS — M19042 Primary osteoarthritis, left hand: Secondary | ICD-10-CM | POA: Diagnosis present

## 2022-08-20 DIAGNOSIS — M2480 Other specific joint derangements of unspecified joint, not elsewhere classified: Secondary | ICD-10-CM

## 2022-08-20 DIAGNOSIS — R7989 Other specified abnormal findings of blood chemistry: Secondary | ICD-10-CM | POA: Diagnosis present

## 2022-08-20 DIAGNOSIS — M2142 Flat foot [pes planus] (acquired), left foot: Secondary | ICD-10-CM | POA: Diagnosis present

## 2022-08-20 DIAGNOSIS — M1612 Unilateral primary osteoarthritis, left hip: Secondary | ICD-10-CM | POA: Diagnosis not present

## 2022-08-20 DIAGNOSIS — M21619 Bunion of unspecified foot: Secondary | ICD-10-CM | POA: Insufficient documentation

## 2022-08-20 DIAGNOSIS — M19041 Primary osteoarthritis, right hand: Secondary | ICD-10-CM | POA: Diagnosis not present
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
## 2022-08-21 ENCOUNTER — Other Ambulatory Visit: Payer: Medicare Other

## 2022-08-21 ENCOUNTER — Other Ambulatory Visit: Payer: Self-pay | Admitting: *Deleted

## 2022-08-21 DIAGNOSIS — M79641 Pain in right hand: Secondary | ICD-10-CM

## 2022-08-21 DIAGNOSIS — M19041 Primary osteoarthritis, right hand: Secondary | ICD-10-CM

## 2022-08-21 NOTE — Progress Notes (Signed)
ESR and CRP WNL. Uric acid WNL.  RF negative.   Hepatic function panel WNL.

## 2022-08-21 NOTE — Progress Notes (Signed)
Please forward hepatic function panel results to Dr. Birdie Riddle as requested.

## 2022-08-21 NOTE — Progress Notes (Signed)
No erosive changes noted.   Deformity at base of second metacarpal--may be old trauma-please clarify if she had previous injury?

## 2022-08-21 NOTE — Progress Notes (Signed)
X-rays of both hands are consistent with osteoarthritis.  Dr. Estanislado Pandy noted a well-defined radiodense region in left fifth middle phalanx-may represent bony island-She recommended evaluation by hand surgeon.

## 2022-08-22 NOTE — Telephone Encounter (Signed)
Attempted to contact the patient and left message to advise patient we have a few lab tests still pending. Advised patient Gloria Frazier will call her once all test have resulted to discuss the results as well as the treatment plan.

## 2022-08-22 NOTE — Telephone Encounter (Signed)
She still has a couple of labs pending.  I planned on calling the patient once all of her results were back to discuss the treatment plan.

## 2022-08-23 LAB — CYCLIC CITRUL PEPTIDE ANTIBODY, IGG: Cyclic Citrullin Peptide Ab: <16 UNITS

## 2022-08-23 LAB — SEDIMENTATION RATE: Sed Rate: 6 mm/h (ref 0–30)

## 2022-08-23 LAB — GLUCOSE 6 PHOSPHATE DEHYDROGENASE: G-6PDH: 16.1 U/g Hgb (ref 7.0–20.5)

## 2022-08-23 LAB — HEPATIC FUNCTION PANEL
AG Ratio: 1.5 (calc) (ref 1.0–2.5)
ALT: 28 U/L (ref 6–29)
AST: 27 U/L (ref 10–35)
Albumin: 4.2 g/dL (ref 3.6–5.1)
Alkaline phosphatase (APISO): 60 U/L (ref 37–153)
Bilirubin, Direct: 0 mg/dL (ref 0.0–0.2)
Globulin: 2.8 g/dL (calc) (ref 1.9–3.7)
Indirect Bilirubin: 0.2 mg/dL (calc) (ref 0.2–1.2)
Total Bilirubin: 0.2 mg/dL (ref 0.2–1.2)
Total Protein: 7 g/dL (ref 6.1–8.1)

## 2022-08-23 LAB — URIC ACID: Uric Acid, Serum: 6.8 mg/dL (ref 2.5–7.0)

## 2022-08-23 LAB — RHEUMATOID FACTOR: Rheumatoid fact SerPl-aCnc: <14 IU/mL (ref <14)

## 2022-08-23 LAB — C-REACTIVE PROTEIN: CRP: 3.7 mg/L (ref <8.0)

## 2022-08-24 NOTE — Progress Notes (Signed)
Sed rate normal, uric acid normal, rheumatoid factor negative anti-CCP negative CRP negative G6PD normal CMP normal.

## 2022-08-26 ENCOUNTER — Encounter: Payer: Self-pay | Admitting: Family Medicine

## 2022-08-27 ENCOUNTER — Telehealth: Payer: Self-pay | Admitting: Rheumatology

## 2022-08-27 NOTE — Telephone Encounter (Signed)
Referral changed and refaxed, pending appt.

## 2022-08-27 NOTE — Telephone Encounter (Signed)
Patient requested a referral to Dr. Roseanne Kaufman at [facility].

## 2022-09-01 DIAGNOSIS — S42309A Unspecified fracture of shaft of humerus, unspecified arm, initial encounter for closed fracture: Secondary | ICD-10-CM

## 2022-09-01 HISTORY — DX: Unspecified fracture of shaft of humerus, unspecified arm, initial encounter for closed fracture: S42.309A

## 2022-09-03 ENCOUNTER — Telehealth: Payer: Self-pay | Admitting: Family Medicine

## 2022-09-03 DIAGNOSIS — S42202K Unspecified fracture of upper end of left humerus, subsequent encounter for fracture with nonunion: Secondary | ICD-10-CM

## 2022-09-03 NOTE — Telephone Encounter (Signed)
Caller name: SARIANA PAYSON  On [place]?: Yes  Call back number: [phone] (mobile)  Provider they see: Midge Minium, MD  Reason for call:Pt fell 09/01/22 went ER in [place] and they upon discharge are requesting follow up with ortho ASAP. Diagnosis is closed fracture of proximal end of L humerus. She wants to be referred to [facility].

## 2022-09-03 NOTE — Telephone Encounter (Signed)
Referral sent to [facility]

## 2022-09-03 NOTE — Telephone Encounter (Signed)
Should this referral be placed then have her follow up with you here?

## 2022-09-03 NOTE — Telephone Encounter (Signed)
Ok for ortho referral 

## 2022-09-03 NOTE — Telephone Encounter (Signed)
Pt would like to go to [facility]
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
## 2022-09-11 HISTORY — PX: HUMERUS FRACTURE SURGERY W/ IMPLANT: SHX671

## 2022-09-19 ENCOUNTER — Ambulatory Visit: Payer: Medicare Other | Attending: Rheumatology | Admitting: Rheumatology

## 2022-09-19 ENCOUNTER — Encounter: Payer: Self-pay | Admitting: Rheumatology

## 2022-09-19 VITALS — BP 110/73 | HR 71 | Resp 12 | Ht 65.0 in | Wt 162.0 lb

## 2022-09-19 DIAGNOSIS — S42292D Other displaced fracture of upper end of left humerus, subsequent encounter for fracture with routine healing: Secondary | ICD-10-CM | POA: Diagnosis present

## 2022-09-19 DIAGNOSIS — M2480 Other specific joint derangements of unspecified joint, not elsewhere classified: Secondary | ICD-10-CM | POA: Diagnosis present

## 2022-09-19 DIAGNOSIS — M2141 Flat foot [pes planus] (acquired), right foot: Secondary | ICD-10-CM

## 2022-09-19 DIAGNOSIS — Z96641 Presence of right artificial hip joint: Secondary | ICD-10-CM

## 2022-09-19 DIAGNOSIS — M19041 Primary osteoarthritis, right hand: Secondary | ICD-10-CM | POA: Diagnosis present

## 2022-09-19 DIAGNOSIS — M1612 Unilateral primary osteoarthritis, left hip: Secondary | ICD-10-CM

## 2022-09-19 DIAGNOSIS — Z13820 Encounter for screening for osteoporosis: Secondary | ICD-10-CM | POA: Diagnosis present

## 2022-09-19 DIAGNOSIS — R7989 Other specified abnormal findings of blood chemistry: Secondary | ICD-10-CM

## 2022-09-19 DIAGNOSIS — M2142 Flat foot [pes planus] (acquired), left foot: Secondary | ICD-10-CM | POA: Diagnosis present

## 2022-09-19 DIAGNOSIS — Z853 Personal history of malignant neoplasm of breast: Secondary | ICD-10-CM

## 2022-09-19 DIAGNOSIS — M7711 Lateral epicondylitis, right elbow: Secondary | ICD-10-CM

## 2022-09-19 DIAGNOSIS — M19042 Primary osteoarthritis, left hand: Secondary | ICD-10-CM | POA: Diagnosis present

## 2022-09-19 DIAGNOSIS — M21619 Bunion of unspecified foot: Secondary | ICD-10-CM

## 2022-09-19 NOTE — Patient Instructions (Signed)
Hand Exercises Hand exercises can be helpful for almost anyone. These exercises can strengthen the hands, improve flexibility and movement, and increase blood flow to the hands. These results can make work and daily tasks easier. Hand exercises can be especially helpful for people who have joint pain from arthritis or have nerve damage from overuse (carpal tunnel syndrome). These exercises can also help people who have injured a hand. Exercises Most of these hand exercises are gentle stretching and motion exercises. It is usually safe to do them often throughout the day. Warming up your hands before exercise may help to reduce stiffness. You can do this with gentle massage or by placing your hands in warm water for 10-15 minutes. It is normal to feel some stretching, pulling, tightness, or mild discomfort as you begin new exercises. This will gradually improve. Stop an exercise right away if you feel sudden, severe pain or your pain gets worse. Ask your health care provider which exercises are best for you. Knuckle bend or "claw" fist  Stand or sit with your arm, hand, and all five fingers pointed straight up. Make sure to keep your wrist straight during the exercise. Gently bend your fingers down toward your palm until the tips of your fingers are touching the top of your palm. Keep your big knuckle straight and just bend the small knuckles in your fingers. Hold this position for __________ seconds. Straighten (extend) your fingers back to the starting position. Repeat this exercise 5-10 times with each hand. Full finger fist  Stand or sit with your arm, hand, and all five fingers pointed straight up. Make sure to keep your wrist straight during the exercise. Gently bend your fingers into your palm until the tips of your fingers are touching the middle of your palm. Hold this position for __________ seconds. Extend your fingers back to the starting position, stretching every joint fully. Repeat  this exercise 5-10 times with each hand. Straight fist Stand or sit with your arm, hand, and all five fingers pointed straight up. Make sure to keep your wrist straight during the exercise. Gently bend your fingers at the big knuckle, where your fingers meet your hand, and the middle knuckle. Keep the knuckle at the tips of your fingers straight and try to touch the bottom of your palm. Hold this position for __________ seconds. Extend your fingers back to the starting position, stretching every joint fully. Repeat this exercise 5-10 times with each hand. Tabletop  Stand or sit with your arm, hand, and all five fingers pointed straight up. Make sure to keep your wrist straight during the exercise. Gently bend your fingers at the big knuckle, where your fingers meet your hand, as far down as you can while keeping the small knuckles in your fingers straight. Think of forming a tabletop with your fingers. Hold this position for __________ seconds. Extend your fingers back to the starting position, stretching every joint fully. Repeat this exercise 5-10 times with each hand. Finger spread  Place your hand flat on a table with your palm facing down. Make sure your wrist stays straight as you do this exercise. Spread your fingers and thumb apart from each other as far as you can until you feel a gentle stretch. Hold this position for __________ seconds. Bring your fingers and thumb tight together again. Hold this position for __________ seconds. Repeat this exercise 5-10 times with each hand. Making circles  Stand or sit with your arm, hand, and all five fingers pointed   straight up. Make sure to keep your wrist straight during the exercise. Make a circle by touching the tip of your thumb to the tip of your index finger. Hold for __________ seconds. Then open your hand wide. Repeat this motion with your thumb and each finger on your hand. Repeat this exercise 5-10 times with each hand. Thumb  motion  Sit with your forearm resting on a table and your wrist straight. Your thumb should be facing up toward the ceiling. Keep your fingers relaxed as you move your thumb. Lift your thumb up as high as you can toward the ceiling. Hold for __________ seconds. Bend your thumb across your palm as far as you can, reaching the tip of your thumb for the small finger (pinkie) side of your palm. Hold for __________ seconds. Repeat this exercise 5-10 times with each hand. Grip strengthening  Hold a stress ball or other soft ball in the middle of your hand. Slowly increase the pressure, squeezing the ball as much as you can without causing pain. Think of bringing the tips of your fingers into the middle of your palm. All of your finger joints should bend when doing this exercise. Hold your squeeze for __________ seconds, then relax. Repeat this exercise 5-10 times with each hand. Contact a health care provider if: Your hand pain or discomfort gets much worse when you do an exercise. Your hand pain or discomfort does not improve within 2 hours after you exercise. If you have any of these problems, stop doing these exercises right away. Do not do them again unless your health care provider says that you can. Get help right away if: You develop sudden, severe hand pain or swelling. If this happens, stop doing these exercises right away. Do not do them again unless your health care provider says that you can. This information is not intended to replace advice given to you by your health care provider. Make sure you discuss any questions you have with your health care provider. Document Revised: 09/21/2020 Document Reviewed: 09/28/2020 Elsevier Patient Education  2023 [employer]  

## 2022-10-10 ENCOUNTER — Telehealth: Payer: Self-pay | Admitting: Family Medicine

## 2022-10-10 NOTE — Telephone Encounter (Signed)
Contacted Hadassah R Hudock to schedule their annual wellness visit. Appointment made for 10/17/2022.  Thank you,  [facility] Support [facility] Direct dial  [phone]

## 2022-10-17 ENCOUNTER — Ambulatory Visit (INDEPENDENT_AMBULATORY_CARE_PROVIDER_SITE_OTHER): Payer: Medicare Other | Admitting: *Deleted

## 2022-10-17 DIAGNOSIS — Z1211 Encounter for screening for malignant neoplasm of colon: Secondary | ICD-10-CM | POA: Diagnosis not present

## 2022-10-17 DIAGNOSIS — Z1231 Encounter for screening mammogram for malignant neoplasm of breast: Secondary | ICD-10-CM

## 2022-10-17 DIAGNOSIS — Z0000 Encounter for general adult medical examination without abnormal findings: Secondary | ICD-10-CM | POA: Diagnosis not present
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
## 2022-10-17 NOTE — Patient Instructions (Signed)
Gloria Frazier , Thank you for taking time to come for your Medicare Wellness Visit. I appreciate your ongoing commitment to your health goals. Please review the following plan we discussed and let me know if I can assist you in the future.   Screening recommendations/referrals: Colonoscopy: up to date Mammogram: Education provided Bone Density: Education provided Recommended yearly ophthalmology/optometry visit for glaucoma screening and checkup Recommended yearly dental visit for hygiene and checkup  Vaccinations: Influenza vaccine: up to date Pneumococcal vaccine: up to date Tdap vaccine: Education provided Shingles vaccine: up to date         Preventive Care 65 Years and Older, Female Preventive care refers to lifestyle choices and visits with your health care provider that can promote health and wellness. What does preventive care include? A yearly physical exam. This is also called an annual well check. Dental exams once or twice a year. Routine eye exams. Ask your health care provider how often you should have your eyes checked. Personal lifestyle choices, including: Daily care of your teeth and gums. Regular physical activity. Eating a healthy diet. Avoiding tobacco and drug use. Limiting alcohol use. Practicing safe sex. Taking low-dose aspirin every day. Taking vitamin and mineral supplements as recommended by your health care provider. What happens during an annual well check? The services and screenings done by your health care provider during your annual well check will depend on your age, overall health, lifestyle risk factors, and family history of disease. Counseling  Your health care provider may ask you questions about your: Alcohol use. Tobacco use. Drug use. Emotional well-being. Home and relationship well-being. Sexual activity. Eating habits. History of falls. Memory and ability to understand (cognition). Work and work Astronomer. Reproductive  health. Screening  You may have the following tests or measurements: Height, weight, and BMI. Blood pressure. Lipid and cholesterol levels. These may be checked every 5 years, or more frequently if you are over 18 years old. Skin check. Lung cancer screening. You may have this screening every year starting at age 54 if you have a 30-pack-year history of smoking and currently smoke or have quit within the past 15 years. Fecal occult blood test (FOBT) of the stool. You may have this test every year starting at age 49. Flexible sigmoidoscopy or colonoscopy. You may have a sigmoidoscopy every 5 years or a colonoscopy every 10 years starting at age 85. Hepatitis C blood test. Hepatitis B blood test. Sexually transmitted disease (STD) testing. Diabetes screening. This is done by checking your blood sugar (glucose) after you have not eaten for a while (fasting). You may have this done every 1-3 years. Bone density scan. This is done to screen for osteoporosis. You may have this done starting at age 63. Mammogram. This may be done every 1-2 years. Talk to your health care provider about how often you should have regular mammograms. Talk with your health care provider about your test results, treatment options, and if necessary, the need for more tests. Vaccines  Your health care provider may recommend certain vaccines, such as: Influenza vaccine. This is recommended every year. Tetanus, diphtheria, and acellular pertussis (Tdap, Td) vaccine. You may need a Td booster every 10 years. Zoster vaccine. You may need this after age 56. Pneumococcal 13-valent conjugate (PCV13) vaccine. One dose is recommended after age 50. Pneumococcal polysaccharide (PPSV23) vaccine. One dose is recommended after age 45. Talk to your health care provider about which screenings and vaccines you need and how often you need them. This  information is not intended to replace advice given to you by your health care provider.  Make sure you discuss any questions you have with your health care provider. Document Released: 07/07/2015 Document Revised: 02/28/2016 Document Reviewed: 04/11/2015 Elsevier Interactive Patient Education  2017 [place] Prevention in the Home Falls can cause injuries. They can happen to people of all ages. There are many things you can do to make your home safe and to help prevent falls. What can I do on the outside of my home? Regularly fix the edges of walkways and driveways and fix any cracks. Remove anything that might make you trip as you walk through a door, such as a raised step or threshold. Trim any bushes or trees on the path to your home. Use bright outdoor lighting. Clear any walking paths of anything that might make someone trip, such as rocks or tools. Regularly check to see if handrails are loose or broken. Make sure that both sides of any steps have handrails. Any raised decks and porches should have guardrails on the edges. Have any leaves, snow, or ice cleared regularly. Use sand or salt on walking paths during winter. Clean up any spills in your garage right away. This includes oil or grease spills. What can I do in the bathroom? Use night lights. Install grab bars by the toilet and in the tub and shower. Do not use towel bars as grab bars. Use non-skid mats or decals in the tub or shower. If you need to sit down in the shower, use a plastic, non-slip stool. Keep the floor dry. Clean up any water that spills on the floor as soon as it happens. Remove soap buildup in the tub or shower regularly. Attach bath mats securely with double-sided non-slip rug tape. Do not have throw rugs and other things on the floor that can make you trip. What can I do in the bedroom? Use night lights. Make sure that you have a light by your bed that is easy to reach. Do not use any sheets or blankets that are too big for your bed. They should not hang down onto the floor. Have a  firm chair that has side arms. You can use this for support while you get dressed. Do not have throw rugs and other things on the floor that can make you trip. What can I do in the kitchen? Clean up any spills right away. Avoid walking on wet floors. Keep items that you use a lot in easy-to-reach places. If you need to reach something above you, use a strong step stool that has a grab bar. Keep electrical cords out of the way. Do not use floor polish or wax that makes floors slippery. If you must use wax, use non-skid floor wax. Do not have throw rugs and other things on the floor that can make you trip. What can I do with my stairs? Do not leave any items on the stairs. Make sure that there are handrails on both sides of the stairs and use them. Fix handrails that are broken or loose. Make sure that handrails are as long as the stairways. Check any carpeting to make sure that it is firmly attached to the stairs. Fix any carpet that is loose or worn. Avoid having throw rugs at the top or bottom of the stairs. If you do have throw rugs, attach them to the floor with carpet tape. Make sure that you have a light switch at the top  of the stairs and the bottom of the stairs. If you do not have them, ask someone to add them for you. What else can I do to help prevent falls? Wear shoes that: Do not have high heels. Have rubber bottoms. Are comfortable and fit you well. Are closed at the toe. Do not wear sandals. If you use a stepladder: Make sure that it is fully opened. Do not climb a closed stepladder. Make sure that both sides of the stepladder are locked into place. Ask someone to hold it for you, if possible. Clearly mark and make sure that you can see: Any grab bars or handrails. First and last steps. Where the edge of each step is. Use tools that help you move around (mobility aids) if they are needed. These include: Canes. Walkers. Scooters. Crutches. Turn on the lights when you  go into a dark area. Replace any light bulbs as soon as they burn out. Set up your furniture so you have a clear path. Avoid moving your furniture around. If any of your floors are uneven, fix them. If there are any pets around you, be aware of where they are. Review your medicines with your doctor. Some medicines can make you feel dizzy. This can increase your chance of falling. Ask your doctor what other things that you can do to help prevent falls. This information is not intended to replace advice given to you by your health care provider. Make sure you discuss any questions you have with your health care provider. Document Released: 04/06/2009 Document Revised: 11/16/2015 Document Reviewed: 07/15/2014 Elsevier Interactive Patient Education  2017 [employer].

## 2022-10-21 ENCOUNTER — Encounter: Payer: Self-pay | Admitting: Internal Medicine

## 2022-12-03 ENCOUNTER — Other Ambulatory Visit: Payer: Self-pay

## 2022-12-03 MED ORDER — VENLAFAXINE HCL ER 75 MG PO CP24: 75.0000 mg | ORAL_CAPSULE | Freq: Every day | ORAL | 3 refills | Status: DC

## 2023-01-02 ENCOUNTER — Encounter: Payer: Self-pay | Admitting: Family Medicine

## 2023-01-02 ENCOUNTER — Ambulatory Visit (INDEPENDENT_AMBULATORY_CARE_PROVIDER_SITE_OTHER): Payer: Medicare Other | Admitting: Family Medicine

## 2023-01-02 VITALS — BP 120/78 | HR 90 | Temp 97.8°F | Resp 17 | Ht 65.0 in | Wt 165.4 lb

## 2023-01-02 DIAGNOSIS — W57XXXA Bitten or stung by nonvenomous insect and other nonvenomous arthropods, initial encounter: Secondary | ICD-10-CM | POA: Diagnosis not present

## 2023-01-02 DIAGNOSIS — S30860A Insect bite (nonvenomous) of lower back and pelvis, initial encounter: Secondary | ICD-10-CM

## 2023-01-02 DIAGNOSIS — L03312 Cellulitis of back [any part except buttock]: Secondary | ICD-10-CM | POA: Diagnosis not present

## 2023-01-02 MED ORDER — METHYLPHENIDATE HCL ER (OSM) 18 MG PO TBCR: 18.0000 mg | EXTENDED_RELEASE_TABLET | Freq: Every day | ORAL | 0 refills | Status: DC

## 2023-01-02 MED ORDER — TRIAMCINOLONE ACETONIDE 0.1 % EX OINT: 1.0000 | TOPICAL_OINTMENT | Freq: Two times a day (BID) | CUTANEOUS | 1 refills | Status: DC

## 2023-01-02 MED ORDER — DOXYCYCLINE HYCLATE 100 MG PO TABS: 100.0000 mg | ORAL_TABLET | Freq: Two times a day (BID) | ORAL | 0 refills | Status: DC

## 2023-01-02 NOTE — Progress Notes (Signed)
   Subjective:    Patient ID: Gloria Frazier, female    DOB: [birth year], 67 y.o.   MRN: [id number]  HPI Tick bite- pt was doing yard work in [facility] and had multiple tick bites of her back.  Pt reports she was 'digging at the area' and now they are red and inflamed.  Bites occurred on Sunday, R upper back.  No fever.  No rash.   Review of Systems For ROS see HPI     Objective:   Physical Exam Constitutional:      General: She is not in acute distress.    Appearance: Normal appearance. She is not ill-appearing.  HENT:     Head: Normocephalic and atraumatic.  Skin:    General: Skin is warm and dry.     Findings: Erythema (2 bite sites on R upper back each w/ surrounding erythema and urticaria) present.  Neurological:     General: No focal deficit present.     Mental Status: She is alert and oriented to person, place, and time.  Psychiatric:        Mood and Affect: Mood normal.        Behavior: Behavior normal.        Thought Content: Thought content normal.           Assessment & Plan:   Tick bite/cellulitis- new.  R upper back.  Bite seems to have also caused intense local urticarial reaction.  Start Doxycycline for cellulitis, topical triamcinolone for the hives and daily OTC antihistamine to help w/ itching.  Pt expressed understanding and is in agreement w/ plan.

## 2023-01-02 NOTE — Patient Instructions (Signed)
Follow up as needed or as scheduled Start the Doxycycline twice daily- take w/ food Apply the Triamcinolone ointment twice daily for the itching ADD a daily Claritin or Zyrtec to help w/ the itch Call with any questions or concerns Hang in there!!!

## 2023-01-20 ENCOUNTER — Other Ambulatory Visit: Payer: Self-pay | Admitting: *Deleted

## 2023-01-20 MED ORDER — TAMOXIFEN CITRATE 20 MG PO TABS: 20.0000 mg | ORAL_TABLET | Freq: Every day | ORAL | 4 refills | Status: DC

## 2023-01-29 ENCOUNTER — Encounter: Payer: Self-pay | Admitting: Internal Medicine

## 2023-02-11 ENCOUNTER — Ambulatory Visit
Admission: RE | Admit: 2023-02-11 | Discharge: 2023-02-11 | Disposition: A | Discharge status: Home / Self Care | Payer: Medicare Other | Source: Ambulatory Visit | Attending: Hematology and Oncology | Admitting: Hematology and Oncology

## 2023-02-11 DIAGNOSIS — C50411 Malignant neoplasm of upper-outer quadrant of right female breast: Secondary | ICD-10-CM

## 2023-02-14 ENCOUNTER — Ambulatory Visit (AMBULATORY_SURGERY_CENTER): Payer: Medicare Other | Admitting: *Deleted

## 2023-02-14 VITALS — Ht 65.0 in | Wt 160.0 lb

## 2023-02-14 DIAGNOSIS — Z86010 Personal history of colonic polyps: Secondary | ICD-10-CM

## 2023-02-14 MED ORDER — NA SULFATE-K SULFATE-MG SULF 17.5-3.13-1.6 GM/177ML PO SOLN: 1.0000 | Freq: Once | ORAL | 0 refills | Status: AC

## 2023-02-14 NOTE — Progress Notes (Signed)
Pt's name and DOB verified at the beginning of the pre-visit.  Pt denies any difficulty with ambulating,sitting, laying down or rolling side to side Gave both [facility] main # and MD on call # prior to instructions.  No egg or soy allergy known to patient  No issues known to pt with past sedation with any surgeries or procedures Pt denies having issues being intubated Pt has no issues moving head neck or swallowing No FH of Malignant Hyperthermia Pt is not on diet pills Pt is not on home 02  Pt is not on blood thinners  Pt denies issues with constipation  Pt is not on dialysis Pt denise any abnormal heart rhythms  Pt denies any upcoming cardiac testing Pt encouraged to use to use Singlecare or Goodrx to reduce cost  Patient's chart reviewed by Cathlyn Parsons CNRA prior to pre-visit and patient appropriate for the [facility].  Pre-visit completed and red dot placed by patient's name on their procedure day (on provider's schedule).  . Visit by phone Pt states weight is 160 lb Instructed pt why it is important to and  to call if they have any changes in health or new medications. Directed them to the # given and on instructions.   Pt states they will.  Instructions reviewed with pt and pt states understanding. Instructed to review again prior to procedure. Pt states they will.  Instructions sent by mail with coupon and by my chart

## 2023-02-27 ENCOUNTER — Telehealth: Payer: Self-pay | Admitting: Internal Medicine

## 2023-02-27 NOTE — Telephone Encounter (Signed)
Inbound call from patient stating she is experiencing cold and flu like symptoms. Patient requesting to discuss how to proceed with 9/10 colonoscopy. Please advise, thank you.

## 2023-02-27 NOTE — Telephone Encounter (Signed)
Pts call returned. Pt reports a cough that occurs with increased talking and congestion for about a week now. Pt denies any fever sore throat or diarrhea. Pt advised to do at home COVID test and call back with the results and we can determine if she will need to be r/s or not.

## 2023-02-28 NOTE — Telephone Encounter (Signed)
Patient called stating that she took the COVID test and it was negative. Patient is requesting a call back to discuss. Please advise.

## 2023-02-28 NOTE — Telephone Encounter (Signed)
Spoke with patient. Pt ok to proceed. Pt advised to call us on Monday if she gets worst over the weekend or develops a fever or SOB.

## 2023-03-04 ENCOUNTER — Ambulatory Visit (AMBULATORY_SURGERY_CENTER): Payer: Medicare Other | Admitting: Internal Medicine

## 2023-03-04 ENCOUNTER — Encounter: Payer: Self-pay | Admitting: Internal Medicine

## 2023-03-04 VITALS — BP 126/71 | HR 66 | Temp 96.8°F | Resp 17 | Ht 65.0 in | Wt 160.0 lb

## 2023-03-04 DIAGNOSIS — Z86010 Personal history of colonic polyps: Secondary | ICD-10-CM

## 2023-03-04 DIAGNOSIS — Z09 Encounter for follow-up examination after completed treatment for conditions other than malignant neoplasm: Secondary | ICD-10-CM

## 2023-03-04 MED ORDER — SODIUM CHLORIDE 0.9 % IV SOLN: 500.0000 mL | Freq: Once | INTRAVENOUS | Status: AC

## 2023-03-04 NOTE — Progress Notes (Signed)
Report to PACU, RN, vss, BBS= Clear.  

## 2023-03-04 NOTE — Patient Instructions (Signed)
Discharge instructions given. Handouts on Diverticulosis and Hemorrhoids. Resume previous medications. YOU HAD AN ENDOSCOPIC PROCEDURE TODAY AT THE [facility]:   Refer to the procedure report that was given to you for any specific questions about what was found during the examination.  If the procedure report does not answer your questions, please call your gastroenterologist to clarify.  If you requested that your care partner not be given the details of your procedure findings, then the procedure report has been included in a sealed envelope for you to review at your convenience later.  YOU SHOULD EXPECT: Some feelings of bloating in the abdomen. Passage of more gas than usual.  Walking can help get rid of the air that was put into your GI tract during the procedure and reduce the bloating. If you had a lower endoscopy (such as a colonoscopy or flexible sigmoidoscopy) you may notice spotting of blood in your stool or on the toilet paper. If you underwent a bowel prep for your procedure, you may not have a normal bowel movement for a few days.  Please Note:  You might notice some irritation and congestion in your nose or some drainage.  This is from the oxygen used during your procedure.  There is no need for concern and it should clear up in a day or so.  SYMPTOMS TO REPORT IMMEDIATELY:  Following lower endoscopy (colonoscopy or flexible sigmoidoscopy):  Excessive amounts of blood in the stool  Significant tenderness or worsening of abdominal pains  Swelling of the abdomen that is new, acute  Fever of 100F or higher  For urgent or emergent issues, a gastroenterologist can be reached at any hour by calling [phone]. Do not use MyChart messaging for urgent concerns.    DIET:  We do recommend a small meal at first, but then you may proceed to your regular diet.  Drink plenty of fluids but you should avoid alcoholic beverages for 24 hours.  ACTIVITY:  You should plan to take  it easy for the rest of today and you should NOT DRIVE or use heavy machinery until tomorrow (because of the sedation medicines used during the test).    FOLLOW UP: Our staff will call the number listed on your records the next business day following your procedure.  We will call around 7:15- 8:00 am to check on you and address any questions or concerns that you may have regarding the information given to you following your procedure. If we do not reach you, we will leave a message.     If any biopsies were taken you will be contacted by phone or by letter within the next 1-3 weeks.  Please call us at [phone] if you have not heard about the biopsies in 3 weeks.    SIGNATURES/CONFIDENTIALITY: You and/or your care partner have signed paperwork which will be entered into your electronic medical record.  These signatures attest to the fact that that the information above on your After Visit Summary has been reviewed and is understood.  Full responsibility of the confidentiality of this discharge information lies with you and/or your care-partner.  

## 2023-03-04 NOTE — Progress Notes (Signed)
Pt's states no medical or surgical changes since previsit or office visit. 
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
## 2023-03-04 NOTE — Op Note (Signed)
[facility] Patient Name: Gloria Frazier Procedure Date: 03/04/2023 8:45 AM MRN: [id number] Endoscopist: Wilhemina Bonito. Marina Goodell , MD, [id number] Age: 67 Referring MD:  Date of Birth: May 07, 1956 Gender: Female Account #: [id number] Procedure:                Colonoscopy Indications:              High risk colon cancer surveillance: Personal                            history of non-advanced adenoma. Previous                            examinations 2009, 2019 Medicines:                Monitored Anesthesia Care Procedure:                Pre-Anesthesia Assessment:                           - Prior to the procedure, a History and Physical                            was performed, and patient medications and                            allergies were reviewed. The patient's tolerance of                            previous anesthesia was also reviewed. The risks                            and benefits of the procedure and the sedation                            options and risks were discussed with the patient.                            All questions were answered, and informed consent                            was obtained. Prior Anticoagulants: The patient has                            taken no anticoagulant or antiplatelet agents. ASA                            Grade Assessment: II - A patient with mild systemic                            disease. After reviewing the risks and benefits,                            the patient was deemed in satisfactory condition to  undergo the procedure.                           After obtaining informed consent, the colonoscope                            was passed under direct vision. Throughout the                            procedure, the patient's blood pressure, pulse, and                            oxygen saturations were monitored continuously. The                            CF [id number] #[id number] was introduced through the  anus                            and advanced to the the cecum, identified by                            appendiceal orifice and ileocecal valve. The                            ileocecal valve, appendiceal orifice, and rectum                            were photographed. The quality of the bowel                            preparation was excellent. The colonoscopy was                            performed without difficulty. The patient tolerated                            the procedure well. The bowel preparation used was                            SUPREP via split dose instruction. Scope In: 9:08:40 AM Scope Out: 9:21:17 AM Scope Withdrawal Time: 0 hours 8 minutes 18 seconds  Total Procedure Duration: 0 hours 12 minutes 37 seconds  Findings:                 Many diverticula were found in the entire colon.                           Internal hemorrhoids were found during retroflexion.                           The exam was otherwise without abnormality on                            direct and retroflexion views. Complications:  No immediate complications. Estimated blood loss:                            None. Estimated Blood Loss:     Estimated blood loss: none. Impression:               - Diverticulosis in the entire examined colon.                           - Internal hemorrhoids.                           - The examination was otherwise normal on direct                            and retroflexion views.                           - No specimens collected. Recommendation:           - Repeat colonoscopy in 10 years for surveillance.                           - Patient has a contact number available for                            emergencies. The signs and symptoms of potential                            delayed complications were discussed with the                            patient. Return to normal activities tomorrow.                            Written discharge instructions  were provided to the                            patient.                           - Resume previous diet.                           - Continue present medications. Wilhemina Bonito. Marina Goodell, MD 03/04/2023 9:26:13 AM This report has been signed electronically.

## 2023-03-05 ENCOUNTER — Telehealth: Payer: Self-pay | Admitting: *Deleted

## 2023-03-05 NOTE — Telephone Encounter (Signed)
  Follow up Call-     03/04/2023    7:25 AM  Call back number  Post procedure Call Back phone  # [phone]  Permission to leave phone message Yes   [facility]
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
## 2023-03-24 ENCOUNTER — Ambulatory Visit: Payer: Medicare Other | Attending: Rheumatology | Admitting: Rheumatology

## 2023-03-24 ENCOUNTER — Encounter: Payer: Self-pay | Admitting: Rheumatology

## 2023-03-24 VITALS — BP 138/85 | HR 61 | Resp 15 | Ht 65.0 in | Wt 164.8 lb

## 2023-03-24 DIAGNOSIS — M1612 Unilateral primary osteoarthritis, left hip: Secondary | ICD-10-CM | POA: Diagnosis not present

## 2023-03-24 DIAGNOSIS — Z96641 Presence of right artificial hip joint: Secondary | ICD-10-CM | POA: Diagnosis present

## 2023-03-24 DIAGNOSIS — R7989 Other specified abnormal findings of blood chemistry: Secondary | ICD-10-CM | POA: Diagnosis present

## 2023-03-24 DIAGNOSIS — M2142 Flat foot [pes planus] (acquired), left foot: Secondary | ICD-10-CM | POA: Diagnosis present

## 2023-03-24 DIAGNOSIS — M2141 Flat foot [pes planus] (acquired), right foot: Secondary | ICD-10-CM | POA: Insufficient documentation

## 2023-03-24 DIAGNOSIS — M19042 Primary osteoarthritis, left hand: Secondary | ICD-10-CM | POA: Insufficient documentation

## 2023-03-24 DIAGNOSIS — Z853 Personal history of malignant neoplasm of breast: Secondary | ICD-10-CM | POA: Diagnosis present

## 2023-03-24 DIAGNOSIS — M19041 Primary osteoarthritis, right hand: Secondary | ICD-10-CM | POA: Insufficient documentation

## 2023-03-24 DIAGNOSIS — M2480 Other specific joint derangements of unspecified joint, not elsewhere classified: Secondary | ICD-10-CM | POA: Insufficient documentation

## 2023-03-24 DIAGNOSIS — Z13820 Encounter for screening for osteoporosis: Secondary | ICD-10-CM | POA: Insufficient documentation

## 2023-03-24 DIAGNOSIS — M21619 Bunion of unspecified foot: Secondary | ICD-10-CM | POA: Insufficient documentation

## 2023-03-24 DIAGNOSIS — M25511 Pain in right shoulder: Secondary | ICD-10-CM | POA: Diagnosis not present

## 2023-03-24 DIAGNOSIS — S42292D Other displaced fracture of upper end of left humerus, subsequent encounter for fracture with routine healing: Secondary | ICD-10-CM | POA: Insufficient documentation

## 2023-03-24 MED ORDER — MELOXICAM 7.5 MG PO TABS: ORAL_TABLET | ORAL | 2 refills | Status: DC

## 2023-03-24 MED ORDER — LIDOCAINE HCL 1 % IJ SOLN
1.5000 mL | INTRAMUSCULAR | Status: AC | PRN
Start: 2023-03-24 — End: 2023-03-24
  Administered 2023-03-24: 1.5 mL

## 2023-03-24 MED ORDER — TRIAMCINOLONE ACETONIDE 40 MG/ML IJ SUSP
40.0000 mg | INTRAMUSCULAR | Status: AC | PRN
Start: 2023-03-24 — End: 2023-03-24
  Administered 2023-03-24: 40 mg via INTRA_ARTICULAR

## 2023-03-24 NOTE — Patient Instructions (Signed)

## 2023-04-14 ENCOUNTER — Telehealth: Payer: Self-pay

## 2023-04-14 NOTE — Telephone Encounter (Signed)
Patient advised Patient should be able to call to schedule with Dr. Aundria Rud for further evaluation. Patient verbalized understanding.

## 2023-04-14 NOTE — Telephone Encounter (Signed)
Patient states she was in with Dr. Corliss Skains on 03/24/2023. Patient states at that appointment she received a shot of cortisone in her right shoulder. Patient states her right shoulder has worsened, and she believes she has a torn bicep that is detached. Patient states she had left shoulder surgery with [facility] on 09/01/2022. Patient inquires if she needs another referral to [facility] or if she would be able to call and just make an appointment. Patient states she saw Duwayne Heck and would like to see him again. Patient inquires what she should do. Patient states she does not want to do something that is going to make things worse. Please advise.

## 2023-04-14 NOTE — Telephone Encounter (Signed)
Patient should be able to call to schedule with Dr. Aundria Rud for further evaluation.

## 2023-05-23 ENCOUNTER — Encounter: Payer: Self-pay | Admitting: Internal Medicine

## 2023-05-30 ENCOUNTER — Ambulatory Visit
Admission: RE | Admit: 2023-05-30 | Discharge: 2023-05-30 | Disposition: A | Discharge status: Home / Self Care | Payer: Medicare Other | Source: Ambulatory Visit | Attending: Family Medicine

## 2023-05-30 ENCOUNTER — Ambulatory Visit (INDEPENDENT_AMBULATORY_CARE_PROVIDER_SITE_OTHER): Payer: Medicare Other

## 2023-05-30 VITALS — BP 125/81 | HR 83 | Temp 98.3°F | Resp 18

## 2023-05-30 DIAGNOSIS — J329 Chronic sinusitis, unspecified: Secondary | ICD-10-CM | POA: Diagnosis not present

## 2023-05-30 DIAGNOSIS — J40 Bronchitis, not specified as acute or chronic: Secondary | ICD-10-CM

## 2023-05-30 MED ORDER — PROMETHAZINE-DM 6.25-15 MG/5ML PO SYRP: 5.0000 mL | ORAL_SOLUTION | Freq: Three times a day (TID) | ORAL | 0 refills | Status: DC | PRN

## 2023-05-30 MED ORDER — PREDNISONE 10 MG PO TABS: 30.0000 mg | ORAL_TABLET | Freq: Every day | ORAL | 0 refills | Status: DC

## 2023-05-30 MED ORDER — AMOXICILLIN-POT CLAVULANATE 875-125 MG PO TABS: 1.0000 | ORAL_TABLET | Freq: Two times a day (BID) | ORAL | 0 refills | Status: DC

## 2023-05-30 NOTE — Discharge Instructions (Signed)
I am managing you for sinobronchitis with amoxicillin-clavulanate (an antibiotic) and prednisone (a steroid). Use Tylenol, get rest, push fluids. Use cough syrup as needed or at bedtime only if it makes you sleepy. I will follow up with x-ray results later today.

## 2023-05-30 NOTE — ED Triage Notes (Signed)
Pt reports cough, nasal congestion and chest congestion congestion x 11-12 days. OTC cough meds no relief.   Pt had negative COVID test 1 week ago.
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
## 2023-05-31 ENCOUNTER — Telehealth: Payer: Self-pay | Admitting: Hematology and Oncology

## 2023-05-31 NOTE — Telephone Encounter (Signed)
Spoke with patient confirming upcoming appointment change

## 2023-06-12 ENCOUNTER — Inpatient Hospital Stay: Payer: Medicare Other

## 2023-06-12 ENCOUNTER — Inpatient Hospital Stay: Payer: Medicare Other | Admitting: Hematology and Oncology

## 2023-06-24 ENCOUNTER — Other Ambulatory Visit: Payer: Self-pay | Admitting: Rheumatology

## 2023-06-24 NOTE — Telephone Encounter (Signed)
 Last Fill: 03/24/2023  Labs: 08/09/2022 Eosinophils Relative 7.9, 08/20/2022 Hepatic Function Panel WNL  Next Visit: 09/22/2023  Last Visit: 03/24/2023  DX: Acute pain of right shoulder   Current Dose per office note 03/24/2023: Meloxicam  7.5 mg p.o. daily as needed.   Okay to refill Meloxicam ?

## 2023-06-24 NOTE — Telephone Encounter (Signed)
 Attempted to contact the patient and left message for patient to call the office.

## 2023-06-24 NOTE — Telephone Encounter (Signed)
Patient states she has an appointment with her oncologist on 07/11/2023. Patient states she will have labs forwarded. Patient states she does not currently need a refill on Mobic.

## 2023-06-24 NOTE — Telephone Encounter (Signed)
Please clarify if she has had a recent CMP with [facility]?

## 2023-07-10 ENCOUNTER — Other Ambulatory Visit: Payer: Self-pay | Admitting: *Deleted

## 2023-07-10 DIAGNOSIS — Z170 Estrogen receptor positive status [ER+]: Secondary | ICD-10-CM

## 2023-07-11 ENCOUNTER — Other Ambulatory Visit: Payer: Self-pay | Admitting: *Deleted

## 2023-07-11 ENCOUNTER — Inpatient Hospital Stay (HOSPITAL_BASED_OUTPATIENT_CLINIC_OR_DEPARTMENT_OTHER): Payer: Medicare Other | Admitting: Hematology and Oncology

## 2023-07-11 ENCOUNTER — Inpatient Hospital Stay: Payer: Medicare Other | Attending: Hematology and Oncology

## 2023-07-11 VITALS — BP 117/77 | HR 77 | Temp 98.0°F | Resp 18 | Wt 165.1 lb

## 2023-07-11 DIAGNOSIS — Z1721 Progesterone receptor positive status: Secondary | ICD-10-CM | POA: Diagnosis not present

## 2023-07-11 DIAGNOSIS — Z79810 Long term (current) use of selective estrogen receptor modulators (SERMs): Secondary | ICD-10-CM | POA: Insufficient documentation

## 2023-07-11 DIAGNOSIS — Z90710 Acquired absence of both cervix and uterus: Secondary | ICD-10-CM | POA: Diagnosis not present

## 2023-07-11 DIAGNOSIS — C50411 Malignant neoplasm of upper-outer quadrant of right female breast: Secondary | ICD-10-CM

## 2023-07-11 DIAGNOSIS — Z170 Estrogen receptor positive status [ER+]: Secondary | ICD-10-CM | POA: Insufficient documentation

## 2023-07-11 DIAGNOSIS — Z923 Personal history of irradiation: Secondary | ICD-10-CM | POA: Insufficient documentation

## 2023-07-11 DIAGNOSIS — Z90722 Acquired absence of ovaries, bilateral: Secondary | ICD-10-CM | POA: Diagnosis not present

## 2023-07-11 LAB — CBC WITH DIFFERENTIAL (CANCER CENTER ONLY)
Abs Immature Granulocytes: 0.03 10*3/uL (ref 0.00–0.07)
Basophils Absolute: 0.1 10*3/uL (ref 0.0–0.1)
Basophils Relative: 1 %
Eosinophils Absolute: 0.3 10*3/uL (ref 0.0–0.5)
Eosinophils Relative: 4 %
HCT: 41.4 % (ref 36.0–46.0)
Hemoglobin: 13.7 g/dL (ref 12.0–15.0)
Immature Granulocytes: 0 %
Lymphocytes Relative: 18 %
Lymphs Abs: 1.5 10*3/uL (ref 0.7–4.0)
MCH: 30.3 pg (ref 26.0–34.0)
MCHC: 33.1 g/dL (ref 30.0–36.0)
MCV: 91.6 fL (ref 80.0–100.0)
Monocytes Absolute: 0.7 10*3/uL (ref 0.1–1.0)
Monocytes Relative: 8 %
Neutro Abs: 5.7 10*3/uL (ref 1.7–7.7)
Neutrophils Relative %: 69 %
Platelet Count: 282 10*3/uL (ref 150–400)
RBC: 4.52 MIL/uL (ref 3.87–5.11)
RDW: 11.9 % (ref 11.5–15.5)
WBC Count: 8.3 10*3/uL (ref 4.0–10.5)
nRBC: 0 % (ref 0.0–0.2)

## 2023-07-11 LAB — CMP (CANCER CENTER ONLY)
ALT: 21 U/L (ref 0–44)
AST: 29 U/L (ref 15–41)
Albumin: 4.1 g/dL (ref 3.5–5.0)
Alkaline Phosphatase: 61 U/L (ref 38–126)
Anion gap: 6 (ref 5–15)
BUN: 17 mg/dL (ref 8–23)
CO2: 29 mmol/L (ref 22–32)
Calcium: 9.5 mg/dL (ref 8.9–10.3)
Chloride: 105 mmol/L (ref 98–111)
Creatinine: 0.82 mg/dL (ref 0.44–1.00)
GFR, Estimated: >60 mL/min (ref >60)
Glucose, Bld: 93 mg/dL (ref 70–99)
Potassium: 4.6 mmol/L (ref 3.5–5.1)
Sodium: 140 mmol/L (ref 135–145)
Total Bilirubin: 0.5 mg/dL (ref 0.0–1.2)
Total Protein: 7 g/dL (ref 6.5–8.1)

## 2023-07-11 MED ORDER — MELOXICAM 7.5 MG PO TABS: ORAL_TABLET | ORAL | 2 refills | Status: DC

## 2023-07-11 NOTE — Telephone Encounter (Signed)
Patient contacted the office requesting a refill on her Mobic to be sent to [facility].  Last Fill: 03/24/2023  Labs: 07/11/2023 CBC/CMP WNL  Next Visit: 09/22/2023  Last Visit: 03/24/2023  DX: Acute pain of right shoulder   Current Dose per office note 03/24/2023: meloxicam 7.5 mg p.o. daily as needed.   Okay to refill Meloxicam?
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
## 2023-08-13 ENCOUNTER — Encounter: Payer: Medicare Other | Admitting: Family Medicine

## 2023-08-18 ENCOUNTER — Encounter: Payer: Self-pay | Admitting: Hematology and Oncology

## 2023-08-18 ENCOUNTER — Other Ambulatory Visit: Payer: Self-pay | Admitting: *Deleted

## 2023-08-18 MED ORDER — VENLAFAXINE HCL ER 75 MG PO CP24: 75.0000 mg | ORAL_CAPSULE | Freq: Every day | ORAL | 3 refills | Status: AC

## 2023-08-21 ENCOUNTER — Ambulatory Visit: Payer: Medicare Other | Admitting: Family Medicine

## 2023-08-21 ENCOUNTER — Encounter: Payer: Self-pay | Admitting: Family Medicine

## 2023-08-21 VITALS — BP 104/64 | HR 65 | Temp 98.5°F | Ht 65.0 in | Wt 165.4 lb

## 2023-08-21 DIAGNOSIS — E7800 Pure hypercholesterolemia, unspecified: Secondary | ICD-10-CM | POA: Diagnosis not present

## 2023-08-21 DIAGNOSIS — E663 Overweight: Secondary | ICD-10-CM | POA: Diagnosis not present

## 2023-08-21 DIAGNOSIS — F902 Attention-deficit hyperactivity disorder, combined type: Secondary | ICD-10-CM

## 2023-08-21 LAB — CBC WITH DIFFERENTIAL/PLATELET
Basophils Absolute: 0.1 10*3/uL (ref 0.0–0.1)
Basophils Relative: 1 % (ref 0.0–3.0)
Eosinophils Absolute: 0.2 10*3/uL (ref 0.0–0.7)
Eosinophils Relative: 1.9 % (ref 0.0–5.0)
HCT: 42.3 % (ref 36.0–46.0)
Hemoglobin: 13.8 g/dL (ref 12.0–15.0)
Lymphocytes Relative: 21.5 % (ref 12.0–46.0)
Lymphs Abs: 2.1 10*3/uL (ref 0.7–4.0)
MCHC: 32.6 g/dL (ref 30.0–36.0)
MCV: 93.8 fL (ref 78.0–100.0)
Monocytes Absolute: 0.8 10*3/uL (ref 0.1–1.0)
Monocytes Relative: 7.9 % (ref 3.0–12.0)
Neutro Abs: 6.6 10*3/uL (ref 1.4–7.7)
Neutrophils Relative %: 67.7 % (ref 43.0–77.0)
Platelets: 339 10*3/uL (ref 150.0–400.0)
RBC: 4.51 Mil/uL (ref 3.87–5.11)
RDW: 12.2 % (ref 11.5–15.5)
WBC: 9.8 10*3/uL (ref 4.0–10.5)

## 2023-08-21 LAB — BASIC METABOLIC PANEL
BUN: 20 mg/dL (ref 6–23)
CO2: 26 meq/L (ref 19–32)
Calcium: 9.5 mg/dL (ref 8.4–10.5)
Chloride: 101 meq/L (ref 96–112)
Creatinine, Ser: 0.88 mg/dL (ref 0.40–1.20)
GFR: 67.21 mL/min (ref >60.00)
Glucose, Bld: 79 mg/dL (ref 70–99)
Potassium: 4.5 meq/L (ref 3.5–5.1)
Sodium: 138 meq/L (ref 135–145)

## 2023-08-21 LAB — LIPID PANEL
Cholesterol: 203 mg/dL — ABNORMAL HIGH (ref 0–200)
HDL: 84.3 mg/dL (ref >39.00)
LDL Cholesterol: 98 mg/dL (ref 0–99)
NonHDL: 118.92
Total CHOL/HDL Ratio: 2
Triglycerides: 107 mg/dL (ref 0.0–149.0)
VLDL: 21.4 mg/dL (ref 0.0–40.0)

## 2023-08-21 LAB — HEPATIC FUNCTION PANEL
ALT: 24 U/L (ref 0–35)
AST: 28 U/L (ref 0–37)
Albumin: 4.4 g/dL (ref 3.5–5.2)
Alkaline Phosphatase: 58 U/L (ref 39–117)
Bilirubin, Direct: 0 mg/dL (ref 0.0–0.3)
Total Bilirubin: 0.3 mg/dL (ref 0.2–1.2)
Total Protein: 7.6 g/dL (ref 6.0–8.3)

## 2023-08-21 LAB — TSH: TSH: 1.15 u[IU]/mL (ref 0.35–5.50)

## 2023-08-21 MED ORDER — METHYLPHENIDATE HCL 10 MG PO TABS: 10.0000 mg | ORAL_TABLET | Freq: Every day | ORAL | 0 refills | Status: AC

## 2023-08-21 MED ORDER — METHYLPHENIDATE HCL ER (OSM) 18 MG PO TBCR: 18.0000 mg | EXTENDED_RELEASE_TABLET | Freq: Every day | ORAL | 0 refills | Status: AC

## 2023-08-21 NOTE — Patient Instructions (Signed)
 Follow up in year or as needed We'll notify you of your lab results and make any changes if needed Continue to work on healthy diet and regular exercise- you can do it! We'll call you to schedule your calcium score USE the Methylphenidate as needed for focus Call with any questions or concerns Stay Safe!  Stay Healthy! Happy Spring!

## 2023-08-21 NOTE — Assessment & Plan Note (Signed)
 Ongoing issue.  Weight is stable.  Pt has not been exercising recently but plans to when the weather is nicer.  Will continue to follow.

## 2023-08-21 NOTE — Assessment & Plan Note (Signed)
 Ongoing issue for pt.  She only takes meds as needed when she has something planned.  Will take Concerta for extended relief and add short acting Ritalin as needed for additional focus.  Refills provided.

## 2023-08-21 NOTE — Assessment & Plan Note (Signed)
 Ongoing issue.  LDL is only mildly elevated but pt is concerned b/c husband had triple bypass that was very unexpected.  Check labs.  Start meds prn.  Will also get Calcium score CT at pt's request.  Pt expressed understanding and is in agreement w/ plan.

## 2023-08-21 NOTE — Progress Notes (Signed)
   Subjective:    Patient ID: [name], female    DOB: [birth year], 68 y.o.   MRN: [id number]  HPI Overweight- weight is stable.  BMI 27.5  Pt reports she 'slacked off' on exercise since her fall last spring.  Plans to get more active as the weather improves.    Elevated LDL- last LDL 110.  She has become more concerned given husband's recent triple bypass which seemingly 'came out of nowhere'.  No CP, SOB.    ADHD- pt reports she does not take medication regularly but there are times 'I need to be on my game'.  She would like a refill    Review of Systems For ROS see HPI     Objective:   Physical Exam Vitals reviewed.  Constitutional:      General: She is not in acute distress.    Appearance: Normal appearance. She is well-developed. She is not ill-appearing.  HENT:     Head: Normocephalic and atraumatic.  Eyes:     Conjunctiva/sclera: Conjunctivae normal.     Pupils: Pupils are equal, round, and reactive to light.  Neck:     Thyroid: No thyromegaly.  Cardiovascular:     Rate and Rhythm: Normal rate and regular rhythm.     Pulses: Normal pulses.     Heart sounds: Normal heart sounds. No murmur heard. Pulmonary:     Effort: Pulmonary effort is normal. No respiratory distress.     Breath sounds: Normal breath sounds.  Abdominal:     General: There is no distension.     Palpations: Abdomen is soft.     Tenderness: There is no abdominal tenderness.  Musculoskeletal:     Cervical back: Normal range of motion and neck supple.     Right lower leg: No edema.     Left lower leg: No edema.  Lymphadenopathy:     Cervical: No cervical adenopathy.  Skin:    General: Skin is warm and dry.  Neurological:     General: No focal deficit present.     Mental Status: She is alert and oriented to person, place, and time.  Psychiatric:        Behavior: Behavior normal.           Assessment & Plan:

## 2023-08-22 ENCOUNTER — Encounter: Payer: Self-pay | Admitting: Family Medicine

## 2023-08-25 ENCOUNTER — Telehealth (HOSPITAL_BASED_OUTPATIENT_CLINIC_OR_DEPARTMENT_OTHER): Payer: Self-pay

## 2023-09-02 ENCOUNTER — Telehealth: Payer: Self-pay

## 2023-09-02 ENCOUNTER — Other Ambulatory Visit: Payer: Self-pay

## 2023-09-02 NOTE — Telephone Encounter (Signed)
 error

## 2023-09-03 ENCOUNTER — Ambulatory Visit (HOSPITAL_BASED_OUTPATIENT_CLINIC_OR_DEPARTMENT_OTHER)
Admission: RE | Admit: 2023-09-03 | Discharge: 2023-09-03 | Disposition: A | Discharge status: Home / Self Care | Payer: Self-pay | Source: Ambulatory Visit | Attending: Family Medicine | Admitting: Family Medicine

## 2023-09-03 DIAGNOSIS — E663 Overweight: Secondary | ICD-10-CM | POA: Insufficient documentation

## 2023-09-03 DIAGNOSIS — E7800 Pure hypercholesterolemia, unspecified: Secondary | ICD-10-CM | POA: Insufficient documentation
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
## 2023-09-22 ENCOUNTER — Ambulatory Visit: Payer: Medicare Other | Attending: Physician Assistant | Admitting: Physician Assistant

## 2023-09-22 ENCOUNTER — Encounter: Payer: Self-pay | Admitting: Physician Assistant

## 2023-09-22 VITALS — BP 119/77 | HR 72 | Resp 15 | Ht 64.0 in | Wt 164.4 lb

## 2023-09-22 DIAGNOSIS — M25511 Pain in right shoulder: Secondary | ICD-10-CM | POA: Diagnosis present

## 2023-09-22 DIAGNOSIS — Z96641 Presence of right artificial hip joint: Secondary | ICD-10-CM | POA: Insufficient documentation

## 2023-09-22 DIAGNOSIS — M2142 Flat foot [pes planus] (acquired), left foot: Secondary | ICD-10-CM | POA: Insufficient documentation

## 2023-09-22 DIAGNOSIS — R7989 Other specified abnormal findings of blood chemistry: Secondary | ICD-10-CM | POA: Diagnosis present

## 2023-09-22 DIAGNOSIS — Z853 Personal history of malignant neoplasm of breast: Secondary | ICD-10-CM | POA: Diagnosis present

## 2023-09-22 DIAGNOSIS — M1612 Unilateral primary osteoarthritis, left hip: Secondary | ICD-10-CM | POA: Diagnosis not present

## 2023-09-22 DIAGNOSIS — M19041 Primary osteoarthritis, right hand: Secondary | ICD-10-CM | POA: Diagnosis present

## 2023-09-22 DIAGNOSIS — M19042 Primary osteoarthritis, left hand: Secondary | ICD-10-CM | POA: Diagnosis present

## 2023-09-22 DIAGNOSIS — M21619 Bunion of unspecified foot: Secondary | ICD-10-CM | POA: Diagnosis present

## 2023-09-22 DIAGNOSIS — Z13820 Encounter for screening for osteoporosis: Secondary | ICD-10-CM | POA: Insufficient documentation

## 2023-09-22 DIAGNOSIS — M2480 Other specific joint derangements of unspecified joint, not elsewhere classified: Secondary | ICD-10-CM | POA: Insufficient documentation

## 2023-09-22 DIAGNOSIS — Z5181 Encounter for therapeutic drug level monitoring: Secondary | ICD-10-CM | POA: Insufficient documentation

## 2023-09-22 DIAGNOSIS — S42292D Other displaced fracture of upper end of left humerus, subsequent encounter for fracture with routine healing: Secondary | ICD-10-CM | POA: Insufficient documentation

## 2023-09-22 DIAGNOSIS — M2141 Flat foot [pes planus] (acquired), right foot: Secondary | ICD-10-CM | POA: Insufficient documentation

## 2023-09-22 DIAGNOSIS — G8929 Other chronic pain: Secondary | ICD-10-CM | POA: Insufficient documentation

## 2023-09-22 MED ORDER — MELOXICAM 7.5 MG PO TABS: ORAL_TABLET | ORAL | 2 refills | Status: DC

## 2023-10-20 ENCOUNTER — Other Ambulatory Visit: Payer: Self-pay | Admitting: Physician Assistant

## 2023-10-28 ENCOUNTER — Ambulatory Visit (INDEPENDENT_AMBULATORY_CARE_PROVIDER_SITE_OTHER): Payer: Medicare Other | Admitting: *Deleted

## 2023-10-28 DIAGNOSIS — Z0000 Encounter for general adult medical examination without abnormal findings: Secondary | ICD-10-CM | POA: Diagnosis not present
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
## 2023-10-28 NOTE — Patient Instructions (Signed)
 Gloria Frazier , Thank you for taking time to come for your Medicare Wellness Visit. I appreciate your ongoing commitment to your health goals. Please review the following plan we discussed and let me know if I can assist you in the future.   Screening recommendations/referrals: Colonoscopy: up to date Mammogram  up to date Bone Density: up to date Recommended yearly ophthalmology/optometry visit for glaucoma screening and checkup Recommended yearly dental visit for hygiene and checkup  Vaccinations: Influenza vaccine: up to date Pneumococcal vaccine: up to date Tdap vaccine: Education provided Shingles vaccine: up to date       Preventive Care 65 Years and Older, Female Preventive care refers to lifestyle choices and visits with your health care provider that can promote health and wellness. What does preventive care include? A yearly physical exam. This is also called an annual well check. Dental exams once or twice a year. Routine eye exams. Ask your health care provider how often you should have your eyes checked. Personal lifestyle choices, including: Daily care of your teeth and gums. Regular physical activity. Eating a healthy diet. Avoiding tobacco and drug use. Limiting alcohol use. Practicing safe sex. Taking low-dose aspirin  every day. Taking vitamin and mineral supplements as recommended by your health care provider. What happens during an annual well check? The services and screenings done by your health care provider during your annual well check will depend on your age, overall health, lifestyle risk factors, and family history of disease. Counseling  Your health care provider may ask you questions about your: Alcohol use. Tobacco use. Drug use. Emotional well-being. Home and relationship well-being. Sexual activity. Eating habits. History of falls. Memory and ability to understand (cognition). Work and work Astronomer. Reproductive health. Screening   You may have the following tests or measurements: Height, weight, and BMI. Blood pressure. Lipid and cholesterol levels. These may be checked every 5 years, or more frequently if you are over 41 years old. Skin check. Lung cancer screening. You may have this screening every year starting at age 38 if you have a 30-pack-year history of smoking and currently smoke or have quit within the past 15 years. Fecal occult blood test (FOBT) of the stool. You may have this test every year starting at age 36. Flexible sigmoidoscopy or colonoscopy. You may have a sigmoidoscopy every 5 years or a colonoscopy every 10 years starting at age 76. Hepatitis C blood test. Hepatitis B blood test. Sexually transmitted disease (STD) testing. Diabetes screening. This is done by checking your blood sugar (glucose) after you have not eaten for a while (fasting). You may have this done every 1-3 years. Bone density scan. This is done to screen for osteoporosis. You may have this done starting at age 29. Mammogram. This may be done every 1-2 years. Talk to your health care provider about how often you should have regular mammograms. Talk with your health care provider about your test results, treatment options, and if necessary, the need for more tests. Vaccines  Your health care provider may recommend certain vaccines, such as: Influenza vaccine. This is recommended every year. Tetanus, diphtheria, and acellular pertussis (Tdap, Td) vaccine. You may need a Td booster every 10 years. Zoster vaccine. You may need this after age 52. Pneumococcal 13-valent conjugate (PCV13) vaccine. One dose is recommended after age 35. Pneumococcal polysaccharide (PPSV23) vaccine. One dose is recommended after age 46. Talk to your health care provider about which screenings and vaccines you need and how often you need them.  This information is not intended to replace advice given to you by your health care provider. Make sure you discuss  any questions you have with your health care provider. Document Released: 07/07/2015 Document Revised: 02/28/2016 Document Reviewed: 04/11/2015 Elsevier Interactive Patient Education  2017 [employer].  Fall Prevention in the Home Falls can cause injuries. They can happen to people of all ages. There are many things you can do to make your home safe and to help prevent falls. What can I do on the outside of my home? Regularly fix the edges of walkways and driveways and fix any cracks. Remove anything that might make you trip as you walk through a door, such as a raised step or threshold. Trim any bushes or trees on the path to your home. Use bright outdoor lighting. Clear any walking paths of anything that might make someone trip, such as rocks or tools. Regularly check to see if handrails are loose or broken. Make sure that both sides of any steps have handrails. Any raised decks and porches should have guardrails on the edges. Have any leaves, snow, or ice cleared regularly. Use sand or salt on walking paths during winter. Clean up any spills in your garage right away. This includes oil or grease spills. What can I do in the bathroom? Use night lights. Install grab bars by the toilet and in the tub and shower. Do not use towel bars as grab bars. Use non-skid mats or decals in the tub or shower. If you need to sit down in the shower, use a plastic, non-slip stool. Keep the floor dry. Clean up any water that spills on the floor as soon as it happens. Remove soap buildup in the tub or shower regularly. Attach bath mats securely with double-sided non-slip rug tape. Do not have throw rugs and other things on the floor that can make you trip. What can I do in the bedroom? Use night lights. Make sure that you have a light by your bed that is easy to reach. Do not use any sheets or blankets that are too big for your bed. They should not hang down onto the floor. Have a firm chair that has  side arms. You can use this for support while you get dressed. Do not have throw rugs and other things on the floor that can make you trip. What can I do in the kitchen? Clean up any spills right away. Avoid walking on wet floors. Keep items that you use a lot in easy-to-reach places. If you need to reach something above you, use a strong step stool that has a grab bar. Keep electrical cords out of the way. Do not use floor polish or wax that makes floors slippery. If you must use wax, use non-skid floor wax. Do not have throw rugs and other things on the floor that can make you trip. What can I do with my stairs? Do not leave any items on the stairs. Make sure that there are handrails on both sides of the stairs and use them. Fix handrails that are broken or loose. Make sure that handrails are as long as the stairways. Check any carpeting to make sure that it is firmly attached to the stairs. Fix any carpet that is loose or worn. Avoid having throw rugs at the top or bottom of the stairs. If you do have throw rugs, attach them to the floor with carpet tape. Make sure that you have a light switch at the  top of the stairs and the bottom of the stairs. If you do not have them, ask someone to add them for you. What else can I do to help prevent falls? Wear shoes that: Do not have high heels. Have rubber bottoms. Are comfortable and fit you well. Are closed at the toe. Do not wear sandals. If you use a stepladder: Make sure that it is fully opened. Do not climb a closed stepladder. Make sure that both sides of the stepladder are locked into place. Ask someone to hold it for you, if possible. Clearly mark and make sure that you can see: Any grab bars or handrails. First and last steps. Where the edge of each step is. Use tools that help you move around (mobility aids) if they are needed. These include: Canes. Walkers. Scooters. Crutches. Turn on the lights when you go into a dark area.  Replace any light bulbs as soon as they burn out. Set up your furniture so you have a clear path. Avoid moving your furniture around. If any of your floors are uneven, fix them. If there are any pets around you, be aware of where they are. Review your medicines with your doctor. Some medicines can make you feel dizzy. This can increase your chance of falling. Ask your doctor what other things that you can do to help prevent falls. This information is not intended to replace advice given to you by your health care provider. Make sure you discuss any questions you have with your health care provider. Document Released: 04/06/2009 Document Revised: 11/16/2015 Document Reviewed: 07/15/2014 Elsevier Interactive Patient Education  2017 [employer].

## 2023-11-18 ENCOUNTER — Ambulatory Visit
Admission: RE | Admit: 2023-11-18 | Discharge: 2023-11-18 | Disposition: A | Discharge status: Home / Self Care | Source: Ambulatory Visit | Attending: Nurse Practitioner | Admitting: Nurse Practitioner

## 2023-11-18 VITALS — BP 120/80 | HR 73 | Temp 98.9°F | Resp 17

## 2023-11-18 DIAGNOSIS — H5789 Other specified disorders of eye and adnexa: Secondary | ICD-10-CM

## 2023-11-18 MED ORDER — POLYMYXIN B-TRIMETHOPRIM 10000-0.1 UNIT/ML-% OP SOLN: 1.0000 [drp] | OPHTHALMIC | 0 refills | Status: AC

## 2023-11-18 NOTE — ED Triage Notes (Addendum)
 Pt present with lt eye pain and redness x 4 days. Pt states she was pulling off dead limbs. States she might have scratch in her eye. Denies vision changes.

## 2023-11-18 NOTE — Discharge Instructions (Addendum)
 You were seen today for irritation in your left eye. After a thorough examination, no corneal abrasion or other abnormalities were found. To help reduce irritation and prevent infection, you have been prescribed Polytrim ophthalmic drops. Please place one drop in the left eye every 3 hours while awake for the next 7 days. In addition to the prescribed drops, you may use over-the-counter artificial tears as needed throughout the day to help relieve dryness or irritation.  Monitor your symptoms closely over the next few days. If you develop any sudden vision changes, severe eye pain, significant swelling, increased redness, yellow or green discharge, or if the irritation worsens instead of improving, you should seek immediate evaluation by an ophthalmologist. These may be signs of a more serious eye condition that needs urgent attention. If you have any concerns or questions about your symptoms or treatment, please contact your healthcare provider.
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
## 2024-01-30 ENCOUNTER — Other Ambulatory Visit: Payer: Self-pay | Admitting: *Deleted

## 2024-01-30 MED ORDER — TAMOXIFEN CITRATE 20 MG PO TABS: 20.0000 mg | ORAL_TABLET | Freq: Every day | ORAL | 4 refills | Status: AC

## 2024-02-13 ENCOUNTER — Ambulatory Visit
Admission: RE | Admit: 2024-02-13 | Discharge: 2024-02-13 | Disposition: A | Discharge status: Home / Self Care | Source: Ambulatory Visit | Attending: Family Medicine | Admitting: Family Medicine

## 2024-02-13 VITALS — BP 130/81 | HR 64 | Temp 99.2°F | Resp 16

## 2024-02-13 DIAGNOSIS — W57XXXA Bitten or stung by nonvenomous insect and other nonvenomous arthropods, initial encounter: Secondary | ICD-10-CM

## 2024-02-13 DIAGNOSIS — S70362A Insect bite (nonvenomous), left thigh, initial encounter: Secondary | ICD-10-CM | POA: Diagnosis not present

## 2024-02-13 MED ORDER — DOXYCYCLINE HYCLATE 100 MG PO CAPS: 100.0000 mg | ORAL_CAPSULE | Freq: Two times a day (BID) | ORAL | 0 refills | Status: DC

## 2024-02-13 NOTE — ED Triage Notes (Signed)
 Pt present with a possible tick bite to the back of the lt leg. Pt states she was bitten during the weekend. Reports she felt itchy and felt noticed there is a tick embedded.
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
## 2024-02-13 NOTE — Discharge Instructions (Addendum)
 Start doxycycline  twice daily for 10 days.  Keep area clean and dry and you may use over-the-counter hydrocortisone  cream to the area as needed for itching.  Please follow-up with your PCP if your symptoms do not improve.  Please go to the ER for any worsening symptoms.  Hope you feel better soon!

## 2024-02-19 ENCOUNTER — Other Ambulatory Visit: Payer: Self-pay | Admitting: Obstetrics and Gynecology

## 2024-02-19 DIAGNOSIS — Z1231 Encounter for screening mammogram for malignant neoplasm of breast: Secondary | ICD-10-CM

## 2024-02-19 DIAGNOSIS — Z9889 Other specified postprocedural states: Secondary | ICD-10-CM

## 2024-02-19 DIAGNOSIS — Z853 Personal history of malignant neoplasm of breast: Secondary | ICD-10-CM

## 2024-02-24 ENCOUNTER — Ambulatory Visit
Admission: RE | Admit: 2024-02-24 | Discharge: 2024-02-24 | Disposition: A | Discharge status: Home / Self Care | Source: Ambulatory Visit | Attending: Obstetrics and Gynecology | Admitting: Obstetrics and Gynecology

## 2024-02-24 DIAGNOSIS — Z853 Personal history of malignant neoplasm of breast: Secondary | ICD-10-CM

## 2024-02-24 DIAGNOSIS — Z9889 Other specified postprocedural states: Secondary | ICD-10-CM
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
## 2024-03-31 ENCOUNTER — Encounter: Payer: Self-pay | Admitting: Rheumatology

## 2024-03-31 ENCOUNTER — Ambulatory Visit: Attending: Rheumatology | Admitting: Rheumatology

## 2024-03-31 VITALS — BP 120/78 | HR 84 | Temp 97.4°F | Resp 14 | Ht 64.5 in | Wt 164.6 lb

## 2024-03-31 DIAGNOSIS — G8929 Other chronic pain: Secondary | ICD-10-CM | POA: Diagnosis present

## 2024-03-31 DIAGNOSIS — M1612 Unilateral primary osteoarthritis, left hip: Secondary | ICD-10-CM | POA: Diagnosis present

## 2024-03-31 DIAGNOSIS — Z96641 Presence of right artificial hip joint: Secondary | ICD-10-CM | POA: Insufficient documentation

## 2024-03-31 DIAGNOSIS — Z5181 Encounter for therapeutic drug level monitoring: Secondary | ICD-10-CM | POA: Diagnosis present

## 2024-03-31 DIAGNOSIS — M2141 Flat foot [pes planus] (acquired), right foot: Secondary | ICD-10-CM

## 2024-03-31 DIAGNOSIS — M2042 Other hammer toe(s) (acquired), left foot: Secondary | ICD-10-CM | POA: Diagnosis present

## 2024-03-31 DIAGNOSIS — Z13820 Encounter for screening for osteoporosis: Secondary | ICD-10-CM | POA: Diagnosis present

## 2024-03-31 DIAGNOSIS — S42292D Other displaced fracture of upper end of left humerus, subsequent encounter for fracture with routine healing: Secondary | ICD-10-CM | POA: Diagnosis present

## 2024-03-31 DIAGNOSIS — M19041 Primary osteoarthritis, right hand: Secondary | ICD-10-CM | POA: Diagnosis present

## 2024-03-31 DIAGNOSIS — M2480 Other specific joint derangements of unspecified joint, not elsewhere classified: Secondary | ICD-10-CM | POA: Diagnosis present

## 2024-03-31 DIAGNOSIS — M21619 Bunion of unspecified foot: Secondary | ICD-10-CM | POA: Diagnosis present

## 2024-03-31 DIAGNOSIS — M19042 Primary osteoarthritis, left hand: Secondary | ICD-10-CM | POA: Diagnosis present

## 2024-03-31 DIAGNOSIS — Z853 Personal history of malignant neoplasm of breast: Secondary | ICD-10-CM | POA: Insufficient documentation

## 2024-03-31 DIAGNOSIS — M2041 Other hammer toe(s) (acquired), right foot: Secondary | ICD-10-CM | POA: Diagnosis present

## 2024-03-31 DIAGNOSIS — R7989 Other specified abnormal findings of blood chemistry: Secondary | ICD-10-CM | POA: Insufficient documentation

## 2024-03-31 DIAGNOSIS — M25511 Pain in right shoulder: Secondary | ICD-10-CM | POA: Diagnosis present

## 2024-03-31 LAB — CBC WITH DIFFERENTIAL/PLATELET
Absolute Lymphocytes: 1650 {cells}/uL (ref 850–3900)
Absolute Monocytes: 686 {cells}/uL (ref 200–950)
Basophils Absolute: 80 {cells}/uL (ref 0–200)
Basophils Relative: 1.1 %
Eosinophils Absolute: 482 {cells}/uL (ref 15–500)
Eosinophils Relative: 6.6 %
HCT: 40.3 % (ref 35.0–45.0)
Hemoglobin: 13 g/dL (ref 11.7–15.5)
MCH: 30 pg (ref 27.0–33.0)
MCHC: 32.3 g/dL (ref 32.0–36.0)
MCV: 92.9 fL (ref 80.0–100.0)
MPV: 9.2 fL (ref 7.5–12.5)
Monocytes Relative: 9.4 %
Neutro Abs: 4402 {cells}/uL (ref 1500–7800)
Neutrophils Relative %: 60.3 %
Platelets: 312 Thousand/uL (ref 140–400)
RBC: 4.34 Million/uL (ref 3.80–5.10)
RDW: 11.8 % (ref 11.0–15.0)
Total Lymphocyte: 22.6 %
WBC: 7.3 Thousand/uL (ref 3.8–10.8)

## 2024-03-31 LAB — COMPREHENSIVE METABOLIC PANEL WITH GFR
AG Ratio: 1.8 (calc) (ref 1.0–2.5)
ALT: 32 U/L — ABNORMAL HIGH (ref 6–29)
AST: 36 U/L — ABNORMAL HIGH (ref 10–35)
Albumin: 4.3 g/dL (ref 3.6–5.1)
Alkaline phosphatase (APISO): 59 U/L (ref 37–153)
BUN: 15 mg/dL (ref 7–25)
CO2: 26 mmol/L (ref 20–32)
Calcium: 9.1 mg/dL (ref 8.6–10.4)
Chloride: 104 mmol/L (ref 98–110)
Creat: 0.81 mg/dL (ref 0.50–1.05)
Globulin: 2.4 g/dL (ref 1.9–3.7)
Glucose, Bld: 94 mg/dL (ref 65–99)
Potassium: 4.2 mmol/L (ref 3.5–5.3)
Sodium: 139 mmol/L (ref 135–146)
Total Bilirubin: 0.5 mg/dL (ref 0.2–1.2)
Total Protein: 6.7 g/dL (ref 6.1–8.1)
eGFR: 79 mL/min/1.73m2 (ref >=60)

## 2024-04-01 ENCOUNTER — Ambulatory Visit: Payer: Self-pay | Admitting: Rheumatology

## 2024-04-01 DIAGNOSIS — Z5181 Encounter for therapeutic drug level monitoring: Secondary | ICD-10-CM

## 2024-04-01 DIAGNOSIS — R7989 Other specified abnormal findings of blood chemistry: Secondary | ICD-10-CM

## 2024-04-01 NOTE — Progress Notes (Signed)
 CBC normal, CMP shows elevated LFTs.  Patient should discontinue meloxicam .  She should repeat LFTs (AST and ALT) in 1 month.

## 2024-06-07 ENCOUNTER — Telehealth: Payer: Self-pay | Admitting: Hematology and Oncology

## 2024-06-07 NOTE — Telephone Encounter (Signed)
 I left the patient a voicemail regarding 07/12/24 appt being rescheduled to 07/13/24.

## 2024-07-07 ENCOUNTER — Telehealth: Payer: Self-pay

## 2024-07-07 NOTE — Telephone Encounter (Signed)
 Copied from CRM [id number]. Topic: Appointments - Scheduling Inquiry for Clinic >> Jul 07, 2024 10:50 AM Mesmerise C wrote: Reason for CRM: Patient wanting to schedule a physical but has medicare stated had one last year with same insurance still should be able to get it done with it since nothings changed please reach out to pt if can schedule

## 2024-07-08 NOTE — Telephone Encounter (Signed)
 Called patient to discuss, per Medicare we cannot do a standard physical as Medicare will not pay for it but we can do an exam where we cover many of the same things it just would not be coded for billing as a standard annual exam  If there is still question about this I would advise she call her insurance directly and verify with them.   Otherwise okay to schedule Office visit with comment medicare physical this should not be scheduled back to back with another standard physical, welcome to medicare or a hospital follow up.

## 2024-07-10 ENCOUNTER — Ambulatory Visit
Admission: RE | Admit: 2024-07-10 | Discharge: 2024-07-10 | Disposition: A | Discharge status: Home / Self Care | Source: Ambulatory Visit | Attending: Family Medicine

## 2024-07-10 VITALS — BP 102/66 | HR 89 | Temp 100.1°F | Resp 18

## 2024-07-10 DIAGNOSIS — J988 Other specified respiratory disorders: Secondary | ICD-10-CM

## 2024-07-10 DIAGNOSIS — B9789 Other viral agents as the cause of diseases classified elsewhere: Secondary | ICD-10-CM

## 2024-07-10 DIAGNOSIS — R509 Fever, unspecified: Secondary | ICD-10-CM

## 2024-07-10 LAB — POC COVID19/FLU A&B COMBO
Covid Antigen, POC: NEGATIVE
Influenza A Antigen, POC: NEGATIVE
Influenza B Antigen, POC: NEGATIVE

## 2024-07-10 MED ORDER — PREDNISONE 10 MG PO TABS: 30.0000 mg | ORAL_TABLET | Freq: Every day | ORAL | 0 refills | Status: AC

## 2024-07-10 MED ORDER — CETIRIZINE HCL 10 MG PO TABS: 10.0000 mg | ORAL_TABLET | Freq: Every day | ORAL | 0 refills | Status: AC

## 2024-07-10 MED ORDER — PROMETHAZINE-DM 6.25-15 MG/5ML PO SYRP: 2.5000 mL | ORAL_SOLUTION | Freq: Three times a day (TID) | ORAL | 0 refills | Status: AC | PRN

## 2024-07-10 NOTE — Discharge Instructions (Signed)
 We will manage this as a viral respiratory infection, viral illness like a common cold. For sore throat or cough try using a honey-based tea. Use 3 teaspoons of honey with juice squeezed from half lemon. Place shaved pieces of ginger into 1/2-1 cup of water and warm over stove top. Then mix the ingredients and repeat every 4 hours as needed. Please take Tylenol  500mg -650mg  once every 6 hours for fevers, aches and pains. Hydrate very well with at least 2 liters (64 ounces) of water. Eat light meals such as soups (chicken and noodles, chicken wild rice, vegetable).  Do not eat any foods that you are allergic to.  Start an antihistamine like Zyrtec  (10mg  daily) for postnasal drainage, sinus congestion.  You can take this together with prednisone  to help with your coughing and respiratory symptoms. Use cough syrup as needed.

## 2024-07-10 NOTE — ED Triage Notes (Signed)
 Pt reports cough, fever 100.7 F and chest congestion x 2 days. Mucinex  and ibuprofen gives some relief.
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
## 2024-07-12 ENCOUNTER — Other Ambulatory Visit: Payer: Self-pay

## 2024-07-12 ENCOUNTER — Telehealth: Payer: Self-pay

## 2024-07-12 ENCOUNTER — Other Ambulatory Visit: Payer: Medicare Other

## 2024-07-12 ENCOUNTER — Ambulatory Visit: Payer: Medicare Other | Admitting: Hematology and Oncology

## 2024-07-12 DIAGNOSIS — C50411 Malignant neoplasm of upper-outer quadrant of right female breast: Secondary | ICD-10-CM

## 2024-07-12 NOTE — Telephone Encounter (Signed)
 Patient called seeking advice before deciding whether to cancel her 07/13/2024 appointment for her 5-year check-up. She reports no fever for over 24 hours, a hacky cough, and mild fatigue. She was evaluated at urgent care, where COVID, flu, and RSV tests were negative. Advised the patient to take precautions by wearing a mask when coming to the facility. She was appreciative and confirmed she will attend her appointment tomorrow with Dr. Loretha.

## 2024-07-13 ENCOUNTER — Inpatient Hospital Stay: Admitting: Hematology and Oncology

## 2024-07-13 ENCOUNTER — Inpatient Hospital Stay: Attending: Hematology and Oncology

## 2024-07-13 ENCOUNTER — Encounter: Payer: Self-pay | Admitting: Hematology and Oncology

## 2024-07-13 VITALS — BP 103/58 | HR 71 | Temp 98.7°F | Resp 17

## 2024-07-13 DIAGNOSIS — C50411 Malignant neoplasm of upper-outer quadrant of right female breast: Secondary | ICD-10-CM | POA: Diagnosis not present

## 2024-07-13 DIAGNOSIS — Z79810 Long term (current) use of selective estrogen receptor modulators (SERMs): Secondary | ICD-10-CM | POA: Diagnosis not present

## 2024-07-13 DIAGNOSIS — R748 Abnormal levels of other serum enzymes: Secondary | ICD-10-CM | POA: Insufficient documentation

## 2024-07-13 DIAGNOSIS — Z170 Estrogen receptor positive status [ER+]: Secondary | ICD-10-CM

## 2024-07-13 DIAGNOSIS — Z1721 Progesterone receptor positive status: Secondary | ICD-10-CM | POA: Insufficient documentation

## 2024-07-13 DIAGNOSIS — Z923 Personal history of irradiation: Secondary | ICD-10-CM | POA: Insufficient documentation

## 2024-07-13 DIAGNOSIS — Z1732 Human epidermal growth factor receptor 2 negative status: Secondary | ICD-10-CM | POA: Insufficient documentation

## 2024-07-13 DIAGNOSIS — N6320 Unspecified lump in the left breast, unspecified quadrant: Secondary | ICD-10-CM | POA: Insufficient documentation

## 2024-07-13 DIAGNOSIS — Z90710 Acquired absence of both cervix and uterus: Secondary | ICD-10-CM | POA: Diagnosis not present

## 2024-07-13 DIAGNOSIS — Z90722 Acquired absence of ovaries, bilateral: Secondary | ICD-10-CM | POA: Diagnosis not present

## 2024-07-13 LAB — CMP (CANCER CENTER ONLY)
ALT: 41 U/L (ref 0–44)
AST: 48 U/L — ABNORMAL HIGH (ref 15–41)
Albumin: 4 g/dL (ref 3.5–5.0)
Alkaline Phosphatase: 69 U/L (ref 38–126)
Anion gap: 11 (ref 5–15)
BUN: 12 mg/dL (ref 8–23)
CO2: 27 mmol/L (ref 22–32)
Calcium: 8.8 mg/dL — ABNORMAL LOW (ref 8.9–10.3)
Chloride: 102 mmol/L (ref 98–111)
Creatinine: 0.95 mg/dL (ref 0.44–1.00)
GFR, Estimated: >60 mL/min
Glucose, Bld: 94 mg/dL (ref 70–99)
Potassium: 3.9 mmol/L (ref 3.5–5.1)
Sodium: 140 mmol/L (ref 135–145)
Total Bilirubin: 0.2 mg/dL (ref 0.0–1.2)
Total Protein: 7 g/dL (ref 6.5–8.1)

## 2024-07-13 LAB — CBC WITH DIFFERENTIAL (CANCER CENTER ONLY)
Abs Immature Granulocytes: 0.03 K/uL (ref 0.00–0.07)
Basophils Absolute: 0 K/uL (ref 0.0–0.1)
Basophils Relative: 0 %
Eosinophils Absolute: 0 K/uL (ref 0.0–0.5)
Eosinophils Relative: 0 %
HCT: 37.4 % (ref 36.0–46.0)
Hemoglobin: 12.5 g/dL (ref 12.0–15.0)
Immature Granulocytes: 0 %
Lymphocytes Relative: 17 %
Lymphs Abs: 1.2 K/uL (ref 0.7–4.0)
MCH: 30.3 pg (ref 26.0–34.0)
MCHC: 33.4 g/dL (ref 30.0–36.0)
MCV: 90.6 fL (ref 80.0–100.0)
Monocytes Absolute: 0.8 K/uL (ref 0.1–1.0)
Monocytes Relative: 11 %
Neutro Abs: 5.2 K/uL (ref 1.7–7.7)
Neutrophils Relative %: 72 %
Platelet Count: 241 K/uL (ref 150–400)
RBC: 4.13 MIL/uL (ref 3.87–5.11)
RDW: 12.1 % (ref 11.5–15.5)
WBC Count: 7.4 K/uL (ref 4.0–10.5)
nRBC: 0 % (ref 0.0–0.2)

## 2024-07-13 NOTE — Telephone Encounter (Signed)
 Called patient and was able to speak with her she does want to schedule a visit to cover annual physical items but is going to wait for now due to findings on a recent scan states she will follow up on that then she will call and schedule
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
## 2024-07-27 ENCOUNTER — Inpatient Hospital Stay: Admitting: Hematology and Oncology

## 2024-07-28 ENCOUNTER — Ambulatory Visit
Admission: RE | Admit: 2024-07-28 | Discharge: 2024-07-28 | Disposition: A | Source: Ambulatory Visit | Attending: Hematology and Oncology

## 2024-07-28 DIAGNOSIS — C50411 Malignant neoplasm of upper-outer quadrant of right female breast: Secondary | ICD-10-CM

## 2024-08-03 ENCOUNTER — Inpatient Hospital Stay: Admitting: Hematology and Oncology

## 2024-09-29 ENCOUNTER — Ambulatory Visit: Admitting: Physician Assistant

## 2024-11-02 ENCOUNTER — Encounter
